# Patient Record
Sex: Female | Born: 1975
Health system: Southern US, Community
[De-identification: ages and names within clinical notes are randomized; demographics above are authoritative.]

## PROBLEM LIST (undated history)

## (undated) ENCOUNTER — Emergency Department (HOSPITAL_BASED_OUTPATIENT_CLINIC_OR_DEPARTMENT_OTHER): Admission: EM | Payer: 59 | Source: Home / Self Care

## (undated) DIAGNOSIS — R51 Headache: Secondary | ICD-10-CM

## (undated) DIAGNOSIS — R519 Headache, unspecified: Secondary | ICD-10-CM

## (undated) DIAGNOSIS — G589 Mononeuropathy, unspecified: Secondary | ICD-10-CM

## (undated) DIAGNOSIS — E049 Nontoxic goiter, unspecified: Secondary | ICD-10-CM

## (undated) DIAGNOSIS — S149XXA Injury of unspecified nerves of neck, initial encounter: Secondary | ICD-10-CM

## (undated) DIAGNOSIS — J45909 Unspecified asthma, uncomplicated: Secondary | ICD-10-CM

## (undated) DIAGNOSIS — Z789 Other specified health status: Secondary | ICD-10-CM

## (undated) DIAGNOSIS — O24419 Gestational diabetes mellitus in pregnancy, unspecified control: Secondary | ICD-10-CM

## (undated) DIAGNOSIS — Z8719 Personal history of other diseases of the digestive system: Secondary | ICD-10-CM

## (undated) DIAGNOSIS — Z9289 Personal history of other medical treatment: Secondary | ICD-10-CM

## (undated) DIAGNOSIS — D649 Anemia, unspecified: Secondary | ICD-10-CM

## (undated) HISTORY — PX: COLONOSCOPY: SHX174

## (undated) HISTORY — PX: NO PAST SURGERIES: SHX2092

## (undated) HISTORY — PX: UPPER GI ENDOSCOPY: SHX6162

---

## 1997-12-03 ENCOUNTER — Emergency Department (HOSPITAL_COMMUNITY): Admission: EM | Admit: 1997-12-03 | Discharge: 1997-12-04 | Payer: Self-pay | Admitting: Emergency Medicine

## 1998-08-16 ENCOUNTER — Emergency Department (HOSPITAL_COMMUNITY): Admission: EM | Admit: 1998-08-16 | Discharge: 1998-08-17 | Payer: Self-pay | Admitting: Emergency Medicine

## 1999-01-12 ENCOUNTER — Emergency Department (HOSPITAL_COMMUNITY): Admission: EM | Admit: 1999-01-12 | Discharge: 1999-01-12 | Payer: Self-pay | Admitting: *Deleted

## 2001-08-16 ENCOUNTER — Emergency Department (HOSPITAL_COMMUNITY): Admission: EM | Admit: 2001-08-16 | Discharge: 2001-08-16 | Payer: Self-pay | Admitting: Emergency Medicine

## 2005-01-26 ENCOUNTER — Ambulatory Visit: Payer: Self-pay | Admitting: Family Medicine

## 2005-02-01 ENCOUNTER — Ambulatory Visit: Payer: Self-pay | Admitting: Family Medicine

## 2005-02-01 ENCOUNTER — Other Ambulatory Visit: Admission: RE | Admit: 2005-02-01 | Discharge: 2005-02-01 | Payer: Self-pay | Admitting: Family Medicine

## 2005-03-09 ENCOUNTER — Ambulatory Visit: Payer: Self-pay | Admitting: Family Medicine

## 2006-01-26 ENCOUNTER — Ambulatory Visit: Payer: Self-pay | Admitting: Family Medicine

## 2006-05-05 ENCOUNTER — Ambulatory Visit: Payer: Self-pay | Admitting: Family Medicine

## 2006-05-25 ENCOUNTER — Encounter: Admission: RE | Admit: 2006-05-25 | Discharge: 2006-06-24 | Payer: Self-pay | Admitting: Family Medicine

## 2006-08-30 ENCOUNTER — Ambulatory Visit: Payer: Self-pay | Admitting: Family Medicine

## 2007-07-03 ENCOUNTER — Telehealth (INDEPENDENT_AMBULATORY_CARE_PROVIDER_SITE_OTHER): Payer: Self-pay | Admitting: *Deleted

## 2007-07-04 ENCOUNTER — Telehealth: Payer: Self-pay | Admitting: Family Medicine

## 2007-07-07 ENCOUNTER — Telehealth: Payer: Self-pay | Admitting: Family Medicine

## 2007-10-04 ENCOUNTER — Ambulatory Visit: Payer: Self-pay | Admitting: Internal Medicine

## 2007-10-26 ENCOUNTER — Ambulatory Visit: Payer: Self-pay | Admitting: Internal Medicine

## 2007-10-26 ENCOUNTER — Encounter: Payer: Self-pay | Admitting: Family Medicine

## 2007-10-30 ENCOUNTER — Telehealth: Payer: Self-pay | Admitting: Internal Medicine

## 2008-11-18 ENCOUNTER — Encounter: Payer: Self-pay | Admitting: Family Medicine

## 2008-11-26 ENCOUNTER — Ambulatory Visit: Payer: Self-pay | Admitting: Family Medicine

## 2008-11-26 DIAGNOSIS — K219 Gastro-esophageal reflux disease without esophagitis: Secondary | ICD-10-CM | POA: Insufficient documentation

## 2008-11-26 DIAGNOSIS — B373 Candidiasis of vulva and vagina: Secondary | ICD-10-CM

## 2008-11-26 DIAGNOSIS — D649 Anemia, unspecified: Secondary | ICD-10-CM | POA: Insufficient documentation

## 2008-11-26 DIAGNOSIS — K5909 Other constipation: Secondary | ICD-10-CM

## 2008-11-26 DIAGNOSIS — R51 Headache: Secondary | ICD-10-CM

## 2008-11-26 DIAGNOSIS — R519 Headache, unspecified: Secondary | ICD-10-CM | POA: Insufficient documentation

## 2008-11-26 DIAGNOSIS — F329 Major depressive disorder, single episode, unspecified: Secondary | ICD-10-CM

## 2008-11-29 LAB — CONVERTED CEMR LAB
ALT: 11 units/L (ref 0–35)
AST: 20 units/L (ref 0–37)
BUN: 10 mg/dL (ref 6–23)
Basophils Absolute: 0 10*3/uL (ref 0.0–0.1)
Bilirubin, Direct: 0 mg/dL (ref 0.0–0.3)
Calcium: 9 mg/dL (ref 8.4–10.5)
Creatinine, Ser: 0.9 mg/dL (ref 0.4–1.2)
Eosinophils Relative: 1.4 % (ref 0.0–5.0)
GFR calc non Af Amer: 93.1 mL/min (ref >60)
Lymphocytes Relative: 38.8 % (ref 12.0–46.0)
Monocytes Relative: 8.1 % (ref 3.0–12.0)
Neutrophils Relative %: 51.6 % (ref 43.0–77.0)
Platelets: 270 10*3/uL (ref 150.0–400.0)
Potassium: 3.6 meq/L (ref 3.5–5.1)
RDW: 13.2 % (ref 11.5–14.6)
Total Bilirubin: 0.5 mg/dL (ref 0.3–1.2)
WBC: 5.4 10*3/uL (ref 4.5–10.5)

## 2008-12-03 ENCOUNTER — Telehealth: Payer: Self-pay | Admitting: Family Medicine

## 2008-12-03 ENCOUNTER — Ambulatory Visit: Payer: Self-pay | Admitting: Family Medicine

## 2008-12-10 ENCOUNTER — Ambulatory Visit: Payer: Self-pay | Admitting: Family Medicine

## 2009-02-19 ENCOUNTER — Encounter: Payer: Self-pay | Admitting: Family Medicine

## 2009-03-14 ENCOUNTER — Telehealth: Payer: Self-pay | Admitting: Family Medicine

## 2009-03-27 ENCOUNTER — Ambulatory Visit: Payer: Self-pay | Admitting: Family Medicine

## 2009-03-28 ENCOUNTER — Telehealth: Payer: Self-pay | Admitting: *Deleted

## 2009-03-28 LAB — CONVERTED CEMR LAB
Eosinophils Relative: 0.9 % (ref 0.0–5.0)
HCT: 35.1 % — ABNORMAL LOW (ref 36.0–46.0)
HDL: 75.6 mg/dL (ref >39.00)
Hemoglobin: 11.8 g/dL — ABNORMAL LOW (ref 12.0–15.0)
LDL Cholesterol: 92 mg/dL (ref 0–99)
Lymphs Abs: 2 10*3/uL (ref 0.7–4.0)
MCV: 85.1 fL (ref 78.0–100.0)
Monocytes Absolute: 0.5 10*3/uL (ref 0.1–1.0)
Monocytes Relative: 7.9 % (ref 3.0–12.0)
Neutro Abs: 3.2 10*3/uL (ref 1.4–7.7)
Platelets: 315 10*3/uL (ref 150.0–400.0)
RDW: 13.6 % (ref 11.5–14.6)
Total CHOL/HDL Ratio: 2
Triglycerides: 87 mg/dL (ref 0.0–149.0)
Vitamin B-12: 371 pg/mL (ref 211–911)

## 2010-03-03 ENCOUNTER — Ambulatory Visit: Payer: Self-pay | Admitting: Family Medicine

## 2010-03-03 DIAGNOSIS — L708 Other acne: Secondary | ICD-10-CM | POA: Insufficient documentation

## 2010-03-03 DIAGNOSIS — B354 Tinea corporis: Secondary | ICD-10-CM | POA: Insufficient documentation

## 2010-03-04 LAB — CONVERTED CEMR LAB
ALT: 15 units/L (ref 0–35)
AST: 21 units/L (ref 0–37)
Alkaline Phosphatase: 71 units/L (ref 39–117)
BUN: 15 mg/dL (ref 6–23)
Basophils Relative: 0.4 % (ref 0.0–3.0)
Bilirubin, Direct: 0.1 mg/dL (ref 0.0–0.3)
Calcium: 9 mg/dL (ref 8.4–10.5)
Chloride: 105 meq/L (ref 96–112)
Creatinine, Ser: 0.9 mg/dL (ref 0.4–1.2)
Eosinophils Relative: 1.1 % (ref 0.0–5.0)
Ferritin: 16.8 ng/mL (ref 10.0–291.0)
GFR calc non Af Amer: 92.38 mL/min (ref >60)
Lymphocytes Relative: 30.3 % (ref 12.0–46.0)
Monocytes Absolute: 0.3 10*3/uL (ref 0.1–1.0)
Monocytes Relative: 5.7 % (ref 3.0–12.0)
Neutrophils Relative %: 62.5 % (ref 43.0–77.0)
Platelets: 294 10*3/uL (ref 150.0–400.0)
RBC: 3.94 M/uL (ref 3.87–5.11)
Total Bilirubin: 0.5 mg/dL (ref 0.3–1.2)
Total Protein: 6.8 g/dL (ref 6.0–8.3)
Vitamin B-12: 567 pg/mL (ref 211–911)
WBC: 5.4 10*3/uL (ref 4.5–10.5)

## 2010-07-05 NOTE — L&D Delivery Note (Signed)
Delivery Note At 3:21 AM a viable and healthy female was delivered via Vaginal, Spontaneous Delivery (Presentation: direct Occiput Posterior).  APGAR: 8, 9; weight .  5 lb 8 oz Placenta status: , .spont, intact sent to path 2nd to IUGR  Cord: 3 vessels with the following complications: Long.  Cord pH: none  cord blood donation collected Anesthesia: None  Episiotomy: None Lacerations: 2nd degree Suture Repair: 3.0 chromic Est. Blood Loss (mL): 250  Mom to postpartum.  Baby to nursery-stable.  Petar Mucci A 05/12/2011, 4:03 AM

## 2010-08-04 NOTE — Assessment & Plan Note (Signed)
Summary: skin issues/fu on b12/njr   Vital Signs:  Patient profile:   35 year old female Weight:      178 pounds BP sitting:   84 / 64  (left arm) Cuff size:   regular  Vitals Entered By: Raechel Ache, RN (March 03, 2010 9:54 AM) CC: F/u meds; talk about skin issues.   History of Present Illness: Here for several issues. First she has had increasing acne problems on the face and chest for the past 6 months. She sees  dermatologist [facility], but will not be able to see her for several months. They had discussed her using Epiduo, and she asks if I can get her started on this. Also, she gets yeast infections under the breasts which itch. lastly she needs some blood tests for her anemia. She has bben on oral iron and monthly B12 shots for the past year.   Allergies: No Known Drug Allergies  Past History:  Past Medical History: tension Headaches Positive PPD 2002, treated gonorrhea, treated 2006 Depression Anemia from both iron deficiency and low B12 sees Dr. Elmon Else for skin problems GERD chronic constipation sees Dr. Isaac Laud at [facility]  Review of Systems  The patient denies anorexia, fever, weight loss, weight gain, vision loss, decreased hearing, hoarseness, chest pain, syncope, dyspnea on exertion, peripheral edema, prolonged cough, headaches, hemoptysis, abdominal pain, melena, hematochezia, severe indigestion/heartburn, hematuria, incontinence, genital sores, muscle weakness, suspicious skin lesions, transient blindness, difficulty walking, depression, unusual weight change, abnormal bleeding, enlarged lymph nodes, angioedema, breast masses, and testicular masses.    Physical Exam  General:  Well-developed,well-nourished,in no acute distress; alert,appropriate and cooperative throughout examination Neck:  No deformities, masses, or tenderness noted. Lungs:  Normal respiratory effort, chest expands symmetrically. Lungs are clear to  auscultation, no crackles or wheezes. Heart:  Normal rate and regular rhythm. S1 and S2 normal without gallop, murmur, click, rub or other extra sounds. Skin:  has papulopustular acne on the face and upper chest    Impression & Recommendations:  Problem # 1:  ANEMIA-NOS (ICD-285.9)  Her updated medication list for this problem includes:    Ferrous Sulfate 325 (65 Fe) Mg Tabs (Ferrous sulfate) .Marland Kitchen..Marland Kitchen Two times a day    Cyanocobalamin 1000 Mcg/ml Soln (Cyanocobalamin) ..... Inject 1cc x 10 weeks  Orders: Venipuncture (19147) TLB-BMP (Basic Metabolic Panel-BMET) (80048-METABOL) TLB-CBC Platelet - w/Differential (85025-CBCD) TLB-Hepatic/Liver Function Pnl (80076-HEPATIC) TLB-TSH (Thyroid Stimulating Hormone) (84443-TSH) TLB-B12, Serum-Total ONLY (82956-O13)  Problem # 2:  ACNE VULGARIS (ICD-706.1)  Her updated medication list for this problem includes:    Epiduo 0.1-2.5 % Gel (Adapalene-benzoyl peroxide) .Marland Kitchen... Apply once daily  Problem # 3:  TINEA CORPORIS (ICD-110.5)  Complete Medication List: 1)  Ferrous Sulfate 325 (65 Fe) Mg Tabs (Ferrous sulfate) .... Two times a day 2)  Fluconazole 150 Mg Tabs (Fluconazole) .... One as needed 3)  Senokot 8.6 Mg Tabs (Sennosides) .... Once daily as needed 4)  Cyanocobalamin 1000 Mcg/ml Soln (Cyanocobalamin) .... Inject 1cc x 10 weeks 5)  Epiduo 0.1-2.5 % Gel (Adapalene-benzoyl peroxide) .... Apply once daily 6)  Ketoconazole 2 % Crea (Ketoconazole) .... Apply  three times a day as needed  Patient Instructions: 1)  check labs today Prescriptions: KETOCONAZOLE 2 % CREA (KETOCONAZOLE) apply  three times a day as needed  #60 x 5   Entered and Authorized by:   Nelwyn Salisbury MD   Signed by:   Nelwyn Salisbury MD on 03/03/2010   Method used:  Electronically to        [facility] (retail)       [address]       [place], [place]  [address]       Ph: [phone] or [phone]       Fax: [phone]   RxID:    [id number] FLUCONAZOLE 150 MG TABS (FLUCONAZOLE) one as needed  #1 x 11   Entered and Authorized by:   Nelwyn Salisbury MD   Signed by:   Nelwyn Salisbury MD on 03/03/2010   Method used:   Electronically to        [facility] (retail)       [address]       [place], [place]  [address]       Ph: [phone] or [phone]       Fax: [phone]   RxID:   [id number] EPIDUO 0.1-2.5 % GEL (ADAPALENE-BENZOYL PEROXIDE) apply once daily  #30 x 5   Entered and Authorized by:   Nelwyn Salisbury MD   Signed by:   Nelwyn Salisbury MD on 03/03/2010   Method used:   Electronically to        [facility] (retail)       [address]       [place], [place]  [address]       Ph: [phone] or [phone]       Fax: [phone]   RxID:   [id number]   Appended Document: Orders Update     Clinical Lists Changes  Orders: Added new Service order of Specimen Handling (81829) - Signed      Appended Document: Orders Update     Clinical Lists Changes  Orders: Added new Test order of TLB-Ferritin (82728-FER) - Signed

## 2010-09-23 LAB — ABO/RH: RH Type: POSITIVE

## 2010-10-12 LAB — RUBELLA ANTIBODY, IGM: Rubella: IMMUNE

## 2010-10-12 LAB — ANTIBODY SCREEN: Antibody Screen: NEGATIVE

## 2010-10-26 LAB — GC/CHLAMYDIA PROBE AMP, GENITAL: Chlamydia: NEGATIVE

## 2010-11-17 NOTE — Assessment & Plan Note (Signed)
[facility]                                 ON-CALL NOTE   Nicole Donaldson, PALLESCHI                          MRN:          [id number]  DATE:10/26/2007                            DOB:          January 15, 1976    She had an EGD and colonoscopy performed today, Dr. Marina Goodell performed  these procedures.  The best I can tell, they were uneventful.  However,  she felt like her teeth are very cold sensitive and there is some  numbness in her mouth right now.  There is no breathing difficulty or  chest pain or other worrisome symptoms that I can tell.  I told her to  avoid very cold liquids and to give it some time and I suspect that this  would pass and it may not even be related to her procedures.     Iva Boop, MD,FACG  Electronically Signed    CEG/MedQ  DD: 10/26/2007  DT: 10/26/2007  Job #: [id number]   cc:   Wilhemina Bonito. Marina Goodell, MD

## 2010-11-17 NOTE — Assessment & Plan Note (Signed)
[facility]                         GASTROENTEROLOGY OFFICE NOTE   Nicole Donaldson, Nicole Donaldson                        MRN:          [id number]  DATE:10/04/2007                            DOB:          Jul 03, 1976    REASON OR EVALUATION:  Abdominal issues.   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  The  patient has no significant past medical history except for chronic  headaches.  She tells me that in 2001 she developed problems with  epigastric fullness and discomfort.  There was no change with food.  She  also has chronic constipation.  In 2001 she had a barium enema which was  negative.  She subsequently developed problems with nausea, dizziness,  and lightheadedness.  In 2008 she had an ultrasound of the abdomen as  well as HIDA scan, which were negative.  She is continued intermittently  with nausea, epigastric fullness and constipation.  More recently she  has noticed some blood on the tissue with defecation, as well as an  uncomfortable fullness and sensation in the rectum.  Earlier this year  she underwent upper GI with small bowel follow through which is  negative.  She denies significant lower abdominal pain, diarrhea,  melena, or weight loss.  She had been placed on Protonix, which she has  been taking sporadically.  She thought this has helped some fullness in  the throat sensation, but maybe not the other symptoms.  She is taking  nothing for her constipation on a regular basis.   PAST MEDICAL HISTORY:  Chronic headaches.   PAST SURGICAL HISTORY:  None.   ALLERGIES:  AMOXICILLIN.   CURRENT MEDICATIONS:  1. Protonix 40 mg p.r.n.  2. Birth control pills.  3. She also uses BC Powders p.r.n. and ibuprofen p.r.n.   FAMILY HISTORY:  No family history of gastrointestinal malignancy.   SOCIAL HISTORY:  The patient is single, without children.  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  Does not smoke.  Rarely uses alcohol.   REVIEW OF SYSTEMS:  Per diagnostic evaluation form.  Of importance, the  patient has regular menstrual periods and just completed her last cycle  yesterday.   PHYSICAL EXAMINATION:  Well-appearing female in no acute distress.  Blood pressure 112/62.  Heart rate 72.  Weight is 168.6 pounds.  She is  5 feet 6 inches in height.  HEENT:  Sclerae anicteric.  Conjunctivae are pink.  Oral mucosa is  intact.  There is no adenopathy.  LUNGS:  Clear.  HEART:  Regular.  ABDOMEN:  Soft without tenderness, mass or hernia.  Good bowel sounds  heard.  EXTREMITIES:  Without edema.   LABORATORIES:  Laboratories obtained August 10, 2007 were unremarkable  including normal amylase, comprehensive metabolic panel, lipase, and  thyroid-stimulating hormone.  CBC was also unremarkable except for  mildly decreased hemoglobin at 11.3.  MCV was normal.   IMPRESSION:  This is a 35 year old female who presents with complaints  of persistent  nausea, epigastric fullness/discomfort, constipation, and  new onset minor rectal bleeding.  The patient's upper gastrointestinal  complaints could be due to peptic ulcer or reflux disease.  She has not  been using Protonix regularly.  Alternatively, they may be secondary to  constipation.  In terms of her rectal bleeding and fullness, it may be  due to hemorrhoid but should be evaluated further.   RECOMMENDATIONS:  1. Colonoscopy and upper endoscopy to evaluate the aforementioned      symptoms.  The nature of the procedure as well as the risks,      benefits, and alternatives have been reviewed.  She understood and      agreed to proceed.  2. MiraLAX daily for constipation.  3. Use proton pump inhibitor on a regular basis.  4. Ongoing general medical care with Dr. Clent Ridges.     Wilhemina Bonito. Marina Goodell, MD  Electronically Signed    JNP/MedQ  DD:  10/04/2007  DT: 10/04/2007  Job #: [id number]   cc:   Prudy Mater. Clent Ridges, MD  Dorie Rank, P.A.

## 2010-11-20 NOTE — Assessment & Plan Note (Signed)
[facility] OFFICE NOTE   NANSI, BIRMINGHAM                        MRN:          [id number]  DATE:08/30/2006                            DOB:          August 24, 1975    This is a 35 year old woman here for a non-gynecological physical  examination. She does have several complaints to discuss. First off, two  months ago, she began having epigastric pains, right flank pains and  nausea without vomiting on an intermittent basis. There seems to be no  relationship to eating or not eating. It does not wake her up at night.  There has been no vomiting, no fever and no heartburn. She was seen at  [facility] twice over the last couple of weeks. At one point, she had  complete lab work done, which was all within normal limits other than a  low potassium at 3.3, specifically liver enzymes were normal. White  blood cell count was normal and Helico pylori test was negative. She  also had an abdominal ultrasound that was reportedly normal, although  she does not bring copies of this report today. She is given a  prescription for Protonix, but has not started it yet. She is trying to  follow a bland diet. 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 Her bowels have been working well otherwise. As far  as her anxiety and depression goes, she has been doing well. She has  stopped all of her medications and is very pleased about things. She  goes to [facility] on a regular basis for gynecology  examinations. For other details of her past medical history, family  history, social history, habits, etc..refer to our last physical note  dated February 01, 2005.   ALLERGIES:  None.   CURRENT MEDICATIONS:  1. Yasmin daily.  2. Metro gel to the face twice daily as needed.   OBJECTIVE:  Height 5 feet, 4 inches. Weight 168, blood pressure  102/72,  pulse is 80 and regular. Temperature 98.4 degrees.  In general, she is in no acute distress.  SKIN: Is clear.  EYES: Are clear.  EARS: Are clear.  PHARYNX: Is clear.  NECK: Supple, without lymphadenopathy or masses.  LUNGS:  Clear.  CARDIAC: Rate and rhythm are regular without gallops, murmur or rubs.  Distal pulses are full.  ABDOMEN: Soft. Normal bowel sounds, nontender and no masses.  EXTREMITIES: No clubbing, cyanosis or edema.  NEUROLOGIC: Grossly intact.   ASSESSMENT/PLAN:  1. Complete physical. Encouraged her to get more regular exercise.  2. Epigastric pain. She does have samples at home of Nexium and      advised her to get on this once a day on a regular basis. Will also      set up a hepatobiliary scan to further evaluate her      gallbladder function.  3. Hypokalemia. Will begin K-Dur 10 mEq once a day.  4. History of anxiety, now stable off of medications.  Mistina Mater. Clent Ridges, MD  Electronically Signed    SAF/MedQ  DD: 08/30/2006  DT: 08/30/2006  Job #: [id number]

## 2010-11-23 ENCOUNTER — Telehealth: Payer: Self-pay | Admitting: Family Medicine

## 2010-11-23 NOTE — Telephone Encounter (Signed)
[facility] called and said that pts Vit B-12 in borderline low and would like to get recommendation from Dr Clent Ridges on how to treat. Pt is pregnant and is due 05/17/11.

## 2010-11-23 NOTE — Telephone Encounter (Signed)
Per Dr. Clent Ridges he recommends the pt takes a B12 supplement daily to keep her B12 at the current level.

## 2010-11-23 NOTE — Telephone Encounter (Signed)
Called and spoke with Nicole Donaldson at [facility] and she stated Dr. Wonda Olds said the pt is on the lower end of b12 deficiency and the pt is experiencing numbness in her heels.

## 2010-11-24 NOTE — Telephone Encounter (Signed)
Spoke with Nicole Donaldson at  [facility] and she is aware that Dr. Clent Ridges would like pt to take a daily b12 supplement.

## 2011-02-24 ENCOUNTER — Encounter: Payer: 59 | Attending: Obstetrics and Gynecology | Admitting: Dietician

## 2011-02-24 ENCOUNTER — Encounter: Payer: Self-pay | Admitting: Dietician

## 2011-02-24 DIAGNOSIS — Z713 Dietary counseling and surveillance: Secondary | ICD-10-CM | POA: Insufficient documentation

## 2011-02-24 DIAGNOSIS — O99810 Abnormal glucose complicating pregnancy: Secondary | ICD-10-CM | POA: Insufficient documentation

## 2011-02-24 NOTE — Progress Notes (Signed)
  Patient was seen on 02/24/2011 for Gestational Diabetes self-management class at [facility]. The following learning objectives were met by the patient during this course:   States the definition of Gestational Diabetes  States why dietary management is important in controlling blood glucose  Describes the effects each nutrient has on blood glucose levels  Demonstrates ability to create a balanced meal plan  Demonstrates carbohydrate counting   States when to check blood glucose levels  Demonstrates proper blood glucose monitoring techniques  States the effect of stress and exercise on blood glucose levels  States the importance of limiting caffeine and abstaining from alcohol and smoking  Blood glucose monitor given: Accu Chek Nano SmatrView Blood Glucose Monitoring System  Lot # [id number]  Exp: 06/03/2012 Blood glucose reading: 98 pre-lunch  Patient instructed to monitor glucose levels:Fasting and 2 hours post-meal FBS: 60 - <90 1 hour: <140 2 hour: <120  Patient will be seen for follow-up as needed.

## 2011-04-16 LAB — STREP B DNA PROBE: GBS: POSITIVE

## 2011-05-11 ENCOUNTER — Encounter (HOSPITAL_COMMUNITY): Payer: Self-pay | Admitting: *Deleted

## 2011-05-11 ENCOUNTER — Inpatient Hospital Stay (HOSPITAL_COMMUNITY)
Admission: AD | Admit: 2011-05-11 | Discharge: 2011-05-14 | DRG: 775 | Disposition: A | Discharge status: Home / Self Care | Payer: 59 | Source: Ambulatory Visit | Attending: Obstetrics and Gynecology | Admitting: Obstetrics and Gynecology

## 2011-05-11 DIAGNOSIS — O99814 Abnormal glucose complicating childbirth: Secondary | ICD-10-CM | POA: Diagnosis present

## 2011-05-11 DIAGNOSIS — F329 Major depressive disorder, single episode, unspecified: Secondary | ICD-10-CM

## 2011-05-11 DIAGNOSIS — K219 Gastro-esophageal reflux disease without esophagitis: Secondary | ICD-10-CM

## 2011-05-11 DIAGNOSIS — IMO0001 Reserved for inherently not codable concepts without codable children: Secondary | ICD-10-CM | POA: Diagnosis present

## 2011-05-11 DIAGNOSIS — L708 Other acne: Secondary | ICD-10-CM

## 2011-05-11 DIAGNOSIS — K5909 Other constipation: Secondary | ICD-10-CM

## 2011-05-11 DIAGNOSIS — D649 Anemia, unspecified: Secondary | ICD-10-CM

## 2011-05-11 DIAGNOSIS — B354 Tinea corporis: Secondary | ICD-10-CM

## 2011-05-11 DIAGNOSIS — O365990 Maternal care for other known or suspected poor fetal growth, unspecified trimester, not applicable or unspecified: Principal | ICD-10-CM | POA: Diagnosis present

## 2011-05-11 DIAGNOSIS — D62 Acute posthemorrhagic anemia: Secondary | ICD-10-CM | POA: Diagnosis not present

## 2011-05-11 DIAGNOSIS — R51 Headache: Secondary | ICD-10-CM

## 2011-05-11 DIAGNOSIS — B373 Candidiasis of vulva and vagina: Secondary | ICD-10-CM

## 2011-05-11 DIAGNOSIS — O9903 Anemia complicating the puerperium: Secondary | ICD-10-CM | POA: Diagnosis not present

## 2011-05-11 HISTORY — DX: Other specified health status: Z78.9

## 2011-05-11 LAB — CBC
HCT: 31.3 % — ABNORMAL LOW (ref 36.0–46.0)
Hemoglobin: 10.3 g/dL — ABNORMAL LOW (ref 12.0–15.0)
MCH: 26.4 pg (ref 26.0–34.0)
MCHC: 32.9 g/dL (ref 30.0–36.0)
MCV: 80.3 fL (ref 78.0–100.0)

## 2011-05-11 MED ORDER — OXYCODONE-ACETAMINOPHEN 5-325 MG PO TABS: 2.0000 | ORAL_TABLET | ORAL | Status: DC | PRN

## 2011-05-11 MED ORDER — LIDOCAINE HCL (PF) 1 % IJ SOLN
30.0000 mL | INTRAMUSCULAR | Status: DC | PRN
  Administered 2011-05-12: 30 mL via SUBCUTANEOUS
  Filled 2011-05-11: qty 30

## 2011-05-11 MED ORDER — OXYTOCIN 20 UNITS IN LACTATED RINGERS INFUSION - SIMPLE: 125.0000 mL/h | Freq: Once | INTRAVENOUS | Status: DC

## 2011-05-11 MED ORDER — ACETAMINOPHEN 325 MG PO TABS: 650.0000 mg | ORAL_TABLET | ORAL | Status: DC | PRN

## 2011-05-11 MED ORDER — MISOPROSTOL 25 MCG QUARTER TABLET
25.0000 ug | ORAL_TABLET | ORAL | Status: DC | PRN
  Administered 2011-05-11 – 2011-05-12 (×2): 25 ug via VAGINAL
  Filled 2011-05-11 (×2): qty 0.25

## 2011-05-11 MED ORDER — OXYTOCIN BOLUS FROM INFUSION
500.0000 mL | Freq: Once | INTRAVENOUS | Status: DC
  Filled 2011-05-11: qty 500

## 2011-05-11 MED ORDER — ONDANSETRON HCL 4 MG/2ML IJ SOLN: 4.0000 mg | Freq: Four times a day (QID) | INTRAMUSCULAR | Status: DC | PRN

## 2011-05-11 MED ORDER — OXYTOCIN 20 UNITS IN LACTATED RINGERS INFUSION - SIMPLE
1.0000 m[IU]/min | INTRAVENOUS | Status: DC
  Filled 2011-05-11: qty 1000

## 2011-05-11 MED ORDER — OXYTOCIN 10 UNIT/ML IJ SOLN: 10.0000 [IU] | Freq: Once | INTRAMUSCULAR | Status: DC

## 2011-05-11 MED ORDER — CLINDAMYCIN PHOSPHATE 900 MG/50ML IV SOLN
900.0000 mg | Freq: Three times a day (TID) | INTRAVENOUS | Status: DC
  Administered 2011-05-11: 900 mg via INTRAVENOUS
  Filled 2011-05-11 (×3): qty 50

## 2011-05-11 MED ORDER — LACTATED RINGERS IV SOLN
INTRAVENOUS | Status: DC
  Administered 2011-05-12: 1000 mL via INTRAVENOUS
  Administered 2011-05-12: 200 mL via INTRAVENOUS

## 2011-05-11 MED ORDER — LACTATED RINGERS IV SOLN: 500.0000 mL | INTRAVENOUS | Status: DC | PRN

## 2011-05-11 MED ORDER — CITRIC ACID-SODIUM CITRATE 334-500 MG/5ML PO SOLN: 30.0000 mL | ORAL | Status: DC | PRN

## 2011-05-11 NOTE — H&P (Addendum)
Nicole Donaldson is a 35 y.o. female presenting for induction of labor 2nd to IUGR. Sono revealed EFW  5lb 10 (7%) low nl AFI. PNC complicated by Class A1 GDM diet controlled  History OB History    Grav Para Term Preterm Abortions TAB SAB Ect Mult Living   1              Past Medical History  Diagnosis Date  . No pertinent past medical history    Past Surgical History  Procedure Date  . No past surgeries    Family History: family history is not on file. Social History:  reports that she has never smoked. She does not have any smokeless tobacco history on file. She reports that she does not drink alcohol or use illicit drugs.  Review of Systems  All other systems reviewed and are negative.    Dilation: Closed Effacement (%): 70 Station: -3 Exam by:: LCarpenter,RN Blood pressure 134/74, pulse 103, temperature 98.3 F (36.8 C), temperature source Oral, resp. rate 18, height 5\' 6"  (1.676 m), weight 85.276 kg (188 lb), last menstrual period 08/11/2010. Exam Physical Exam  Constitutional: She is oriented to person, place, and time. She appears well-developed and well-nourished.  HENT:  Head: Normocephalic.  Eyes: EOM are normal.  Neck: Neck supple.  Cardiovascular: Regular rhythm.   Respiratory: Breath sounds normal.  GI: Soft.  Musculoskeletal: She exhibits no edema.  Neurological: She is alert and oriented to person, place, and time.  Skin: Skin is warm and dry.  Psychiatric: She has a normal mood and affect.    Prenatal labs: ABO, Rh: O/Positive/-- (03/21 0000) Antibody: Negative (04/09 0000) Rubella: Immune (04/09 0000) RPR: Nonreactive (08/13 0000)  HBsAg: Negative (04/09 0000)  HIV: Non-reactive (04/09 0000)  GBS: Positive (10/12 0000)   Assessment/Plan: IUGR Term gestation GBS cx (+)  Class A1 GDM  P) Admit   Cytotec. Routine labs IV clindamycin. Analgesic prn BS q 4 hrs  Nicole Donaldson A 05/11/2011, 9:57 PM

## 2011-05-12 ENCOUNTER — Encounter (HOSPITAL_COMMUNITY): Payer: Self-pay | Admitting: *Deleted

## 2011-05-12 ENCOUNTER — Other Ambulatory Visit: Payer: Self-pay | Admitting: Obstetrics and Gynecology

## 2011-05-12 LAB — GLUCOSE, CAPILLARY: Glucose-Capillary: 94 mg/dL (ref 70–99)

## 2011-05-12 MED ORDER — ONDANSETRON HCL 4 MG/2ML IJ SOLN: 4.0000 mg | INTRAMUSCULAR | Status: DC | PRN

## 2011-05-12 MED ORDER — ZOLPIDEM TARTRATE 5 MG PO TABS: 5.0000 mg | ORAL_TABLET | Freq: Every evening | ORAL | Status: DC | PRN

## 2011-05-12 MED ORDER — SIMETHICONE 80 MG PO CHEW: 80.0000 mg | CHEWABLE_TABLET | ORAL | Status: DC | PRN

## 2011-05-12 MED ORDER — OXYCODONE-ACETAMINOPHEN 5-325 MG PO TABS: 1.0000 | ORAL_TABLET | ORAL | Status: DC | PRN

## 2011-05-12 MED ORDER — BENZOCAINE-MENTHOL 20-0.5 % EX AERO
1.0000 "application " | INHALATION_SPRAY | CUTANEOUS | Status: DC | PRN
  Administered 2011-05-12: 1 via TOPICAL

## 2011-05-12 MED ORDER — DIBUCAINE 1 % RE OINT: 1.0000 "application " | TOPICAL_OINTMENT | RECTAL | Status: DC | PRN

## 2011-05-12 MED ORDER — POLYSACCHARIDE IRON 150 MG PO CAPS
150.0000 mg | ORAL_CAPSULE | Freq: Two times a day (BID) | ORAL | Status: DC
  Administered 2011-05-12 – 2011-05-13 (×2): 150 mg via ORAL
  Filled 2011-05-12 (×3): qty 1

## 2011-05-12 MED ORDER — IBUPROFEN 600 MG PO TABS
600.0000 mg | ORAL_TABLET | Freq: Four times a day (QID) | ORAL | Status: DC | PRN
  Administered 2011-05-12 – 2011-05-14 (×7): 600 mg via ORAL
  Filled 2011-05-12 (×8): qty 1

## 2011-05-12 MED ORDER — NALBUPHINE SYRINGE 5 MG/0.5 ML
5.0000 mg | INJECTION | INTRAMUSCULAR | Status: DC | PRN
  Administered 2011-05-12: 5 mg via INTRAVENOUS
  Filled 2011-05-12: qty 0.5

## 2011-05-12 MED ORDER — BENZOCAINE-MENTHOL 20-0.5 % EX AERO
INHALATION_SPRAY | CUTANEOUS | Status: AC
  Administered 2011-05-12: 1 via TOPICAL
  Filled 2011-05-12: qty 56

## 2011-05-12 MED ORDER — WITCH HAZEL-GLYCERIN EX PADS: 1.0000 "application " | MEDICATED_PAD | CUTANEOUS | Status: DC | PRN

## 2011-05-12 MED ORDER — ONDANSETRON HCL 4 MG PO TABS: 4.0000 mg | ORAL_TABLET | ORAL | Status: DC | PRN

## 2011-05-12 MED ORDER — PRENATAL PLUS 27-1 MG PO TABS
1.0000 | ORAL_TABLET | Freq: Every day | ORAL | Status: DC
  Administered 2011-05-12 – 2011-05-13 (×2): 1 via ORAL
  Filled 2011-05-12 (×2): qty 1

## 2011-05-12 MED ORDER — TETANUS-DIPHTH-ACELL PERTUSSIS 5-2.5-18.5 LF-MCG/0.5 IM SUSP
0.5000 mL | Freq: Once | INTRAMUSCULAR | Status: DC
  Filled 2011-05-12: qty 0.5

## 2011-05-12 MED ORDER — LANOLIN HYDROUS EX OINT: TOPICAL_OINTMENT | CUTANEOUS | Status: DC | PRN

## 2011-05-12 NOTE — Progress Notes (Signed)
  PPD 0 SVD  S:  Reports feeling well - tired / + cramping             Tolerating po/ No nausea or vomiting             Bleeding is moderate             Pain controlled withprescription NSAID's including motrin             Up ad lib / ambulatory  Newborn breast feeding  / female   O:  A & O x 3 NAD             VS: Blood pressure 116/75, pulse 89, temperature 97.3 F (36.3 C), temperature source Oral, resp. rate 20, height 5\' 6"  (1.676 m), weight 85.276 kg (188 lb), last menstrual period 08/11/2010, SpO2 98.00%, unknown if currently breastfeeding.    Abdomen: soft, non-tender, non-distended             Fundus: firm, non-tender, Ueven  Perineum: ice pack in place  Lochia: moderate  Extremities: no edema, no calf pain or tenderness    A: PPD # 0   Doing well - stable status  P:  Routine post partum orders    Nicole Donaldson 05/12/2011, 10:25 AM

## 2011-05-12 NOTE — Progress Notes (Addendum)
S:  Called @ 3 am by RN 2nd to pt went from ft to 8 cm after pain med. (+) bulging membrane. On arrival pt is complete w/ urge to push. Bag protruding through vagina  S/p cytotec x 2  O: Ve  Fully (+3) intact.   tracing: (+) variable decels with ctx Baseline 140 ? Ctx feq BS 94  IMP: complete  IUGR GBS cx (+) Class A1 GDM  P) anticipate SVD

## 2011-05-13 DIAGNOSIS — IMO0001 Reserved for inherently not codable concepts without codable children: Secondary | ICD-10-CM | POA: Diagnosis present

## 2011-05-13 LAB — CBC
HCT: 27.9 % — ABNORMAL LOW (ref 36.0–46.0)
Hemoglobin: 9.1 g/dL — ABNORMAL LOW (ref 12.0–15.0)
MCH: 26.9 pg (ref 26.0–34.0)
MCV: 82.5 fL (ref 78.0–100.0)
Platelets: 220 10*3/uL (ref 150–400)
RBC: 3.38 MIL/uL — ABNORMAL LOW (ref 3.87–5.11)

## 2011-05-13 MED ORDER — DOCUSATE SODIUM 100 MG PO CAPS
100.0000 mg | ORAL_CAPSULE | Freq: Every day | ORAL | Status: DC
  Administered 2011-05-13: 100 mg via ORAL
  Filled 2011-05-13: qty 1

## 2011-05-13 MED ORDER — POLYSACCHARIDE IRON 150 MG PO CAPS
150.0000 mg | ORAL_CAPSULE | Freq: Every day | ORAL | Status: DC
  Filled 2011-05-13 (×3): qty 1

## 2011-05-13 NOTE — Progress Notes (Addendum)
  PPD 1 SVD  S:  Reports feeling well             Tolerating po/ No nausea or vomiting             Bleeding is moderate             Pain controlled withprescription NSAID's including motrin             Up ad lib / ambulatory  Newborn  Information for the patient's newborn:  Rickia, [place] [[id number]]  female   breast feeding , good latch   O:  A & O x 3 NAD             VS: Blood pressure 101/70, pulse 98, temperature 97.9 F (36.6 C), temperature source Oral, resp. rate 17, height 5\' 6"  (1.676 m), weight 85.276 kg (188 lb), last menstrual period 08/11/2010, SpO2 96.00%, unknown if currently breastfeeding.  LABS:   Basename 05/13/11 0530 05/11/11 2100  HGB 9.1* 10.3*  HCT 27.9* 31.3*    I&O: I/O last 3 completed shifts: In: -  Out: 250 [Blood:250]   Total I/O In: 360 [P.O.:360] Out: -   Lungs: Clear and unlabored  Heart: regular rate and rhythm / no mumurs  Abdomen: soft, non-tender, non-distended              Fundus: firm, non-tender, @ U  Perineum: 2nd deg. Lac repair intact, no edema  Lochia: small  Extremities: no edema, no calf pain or tenderness, neg Homans    A/P: PPD # 1 35 y.o., G1P1001 S/P:induced vaginal   Active Problems:  Postpartum care following vaginal delivery (11/7)  Maternal iron deficiency anemia w/ ABL anemia post SVB start oral fe and stool softener  Doing well - stable status  Routine post partum orders  Anticipate discharge home in AM.   PAUL,DANIELA, CNM, MSN 05/13/2011, 10:33 AM

## 2011-05-14 MED ORDER — DSS 100 MG PO CAPS: 100.0000 mg | ORAL_CAPSULE | Freq: Every day | ORAL | Status: AC

## 2011-05-14 MED ORDER — IBUPROFEN 600 MG PO TABS: 600.0000 mg | ORAL_TABLET | Freq: Four times a day (QID) | ORAL | Status: AC | PRN

## 2011-05-14 MED ORDER — POLYSACCHARIDE IRON 150 MG PO CAPS: 150.0000 mg | ORAL_CAPSULE | Freq: Every day | ORAL | Status: DC

## 2011-05-14 MED ORDER — OXYCODONE-ACETAMINOPHEN 5-325 MG PO TABS: 1.0000 | ORAL_TABLET | Freq: Four times a day (QID) | ORAL | Status: AC | PRN

## 2011-05-14 NOTE — Discharge Summary (Signed)
Obstetric Discharge Summary Reason for Admission: presenting for induction of labor 2nd to IUGR. Sono revealed EFW  5lb 10 (7%) low nl AFI. PNC complicated by Class A1 GDM diet controlled Prenatal Procedures: ultrasound Intrapartum Procedures: spontaneous vaginal delivery Postpartum Procedures: none Complications-Operative and Postpartum: 2nd degree perineal laceration Hemoglobin  Date Value Range Status  05/13/2011 9.1* 12.0-15.0 (g/dL) Final     HCT  Date Value Range Status  05/13/2011 27.9* 36.0-46.0 (%) Final    Discharge Diagnoses: Term Pregnancy-delivered  Discharge Information: Date: 05/14/2011 Activity: pelvic rest Diet: routine Medications: Ibuprofen, Colace, Iron and Percocet Condition: stable Instructions: refer to practice specific booklet Discharge to: home Follow-up Information    Follow up with COUSINS,SHERONETTE A, MD in 6 weeks.   Contact information:   [address] [place] [address] [phone]          Newborn Data: Live born female on 05/12/11 Birth Weight: 5 lb 8.9 oz (2520 g) APGAR: 8, 9  Home with mother.  Tome Wilson K 05/14/2011, 9:45 AM

## 2011-05-14 NOTE — Progress Notes (Signed)
PPD # 2  Subjective: Pt reports feeling feeling well and eager for d/c home/ Pain controlled with prescription NSAID's including motrin and occ percocet Tolerating po/ Voiding without problems/ No n/v Bleeding is light/ Newborn info:  Information for the patient's newborn:  [place], [place] [[id number]]  female  Feeding: breast    Objective:  VS: Blood pressure 98/63, pulse 92, temperature 98.3 F (36.8 C), temperature source Oral, resp. rate 18     Basename 05/13/11 0530 05/11/11 2100  WBC 8.0 7.2  HGB 9.1* 10.3*  HCT 27.9* 31.3*  PLT 220 239    Blood type: --/--/O POS (11/06 2100) Rubella: Immune (04/09 0000)    Physical Exam:  General: alert, cooperative and no distress Abdomen: soft, nontender, normal bowel sounds Uterine Fundus: firm, below umbilicus, nontender Perineum: healing with good reapproximation and small amt edema Lochia: minimal Ext: Homans sign is negative, no sign of DVT and no edema, redness or tenderness in the calves or thighs   A/P: PPD # 2/ G1P1001 S/P SVD w/2nd degree laceration ABL Anemia Doing well and stable for discharge home RX: Ibuprofen 600mg  po Q 6 hrs prn pain #30 Refill x 1 Ferralet 90 1 po QD #30 Refill x 1 Percocet 5/325 1 to 2 po Q 4 hrs prn pain #15 No refill WOB/GYN booklet given Routine pp visit in 6wks

## 2012-08-19 ENCOUNTER — Other Ambulatory Visit: Payer: Self-pay

## 2013-03-28 ENCOUNTER — Ambulatory Visit (HOSPITAL_BASED_OUTPATIENT_CLINIC_OR_DEPARTMENT_OTHER)
Admission: RE | Admit: 2013-03-28 | Discharge: 2013-03-28 | Disposition: A | Discharge status: Home / Self Care | Payer: 59 | Source: Ambulatory Visit | Attending: Internal Medicine | Admitting: Internal Medicine

## 2013-03-28 ENCOUNTER — Encounter: Payer: Self-pay | Admitting: Internal Medicine

## 2013-03-28 ENCOUNTER — Ambulatory Visit (INDEPENDENT_AMBULATORY_CARE_PROVIDER_SITE_OTHER): Payer: 59 | Admitting: Internal Medicine

## 2013-03-28 ENCOUNTER — Ambulatory Visit: Payer: 59 | Admitting: Internal Medicine

## 2013-03-28 VITALS — BP 123/85 | HR 100 | Temp 97.0°F | Resp 18 | Ht 66.0 in | Wt 174.0 lb

## 2013-03-28 DIAGNOSIS — R51 Headache: Secondary | ICD-10-CM

## 2013-03-28 DIAGNOSIS — E049 Nontoxic goiter, unspecified: Secondary | ICD-10-CM

## 2013-03-28 DIAGNOSIS — E538 Deficiency of other specified B group vitamins: Secondary | ICD-10-CM

## 2013-03-28 DIAGNOSIS — R21 Rash and other nonspecific skin eruption: Secondary | ICD-10-CM

## 2013-03-28 DIAGNOSIS — D649 Anemia, unspecified: Secondary | ICD-10-CM

## 2013-03-28 LAB — COMPREHENSIVE METABOLIC PANEL
Albumin: 4.1 g/dL (ref 3.5–5.2)
Alkaline Phosphatase: 56 U/L (ref 39–117)
BUN: 12 mg/dL (ref 6–23)
CO2: 28 mEq/L (ref 19–32)
Calcium: 9.4 mg/dL (ref 8.4–10.5)
Chloride: 103 mEq/L (ref 96–112)
Creat: 0.78 mg/dL (ref 0.50–1.10)
Glucose, Bld: 95 mg/dL (ref 70–99)
Total Bilirubin: 0.3 mg/dL (ref 0.3–1.2)

## 2013-03-28 LAB — CBC WITH DIFFERENTIAL/PLATELET
Basophils Relative: 1 % (ref 0–1)
Eosinophils Absolute: 0.1 10*3/uL (ref 0.0–0.7)
Eosinophils Relative: 2 % (ref 0–5)
HCT: 37.4 % (ref 36.0–46.0)
Hemoglobin: 12 g/dL (ref 12.0–15.0)
Lymphs Abs: 1.4 10*3/uL (ref 0.7–4.0)
MCH: 26.8 pg (ref 26.0–34.0)
MCHC: 32.1 g/dL (ref 30.0–36.0)
MCV: 83.7 fL (ref 78.0–100.0)
Monocytes Absolute: 0.3 10*3/uL (ref 0.1–1.0)
Monocytes Relative: 7 % (ref 3–12)
Neutrophils Relative %: 58 % (ref 43–77)
Platelets: 393 10*3/uL (ref 150–400)
RBC: 4.47 MIL/uL (ref 3.87–5.11)
WBC: 4.3 10*3/uL (ref 4.0–10.5)

## 2013-03-28 LAB — LIPID PANEL
Cholesterol: 169 mg/dL (ref 0–200)
HDL: 68 mg/dL (ref >39)
LDL Cholesterol: 92 mg/dL (ref 0–99)
Triglycerides: 47 mg/dL (ref <150)
VLDL: 9 mg/dL (ref 0–40)

## 2013-03-28 NOTE — Patient Instructions (Addendum)
See me next Thursday  Will set up appt with Dr. Emily Filbert or Dr. Sharyn Lull

## 2013-03-28 NOTE — Progress Notes (Signed)
Subjective:    Patient ID: Nicole Donaldson, female    DOB: 10-May-1976, 37 y.o.   MRN: [id number]  HPI Nicole Donaldson is here as a new pt.   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   She is here with her 38 yo daughter  She is here  To establish care.  Former care Dr. Clent Ridges at [place].    PMH of B12 deficiency, gestational diabetes,  Migraine headaces,  +PPD ( treated with 9 months of [facility]),  Hiatal hernia  She is concerned over several issues  Fatigue , sleep deprivation  :  She works second shift until 3 am.   She has a 2 yo daughter that she shares caretaking responsibilities with biologic father.  She know that she is chronically sleep deprived as she has to get up early to bring daughter home from caretaker.   Has a hard time "shutting her mind down".   Skin rash  Chronic dark rash both lower legs. She has seen mulltiple dermatologists including one at [facility]  Dr. Randye Lobo   Told it was due to shaving but she is not shavinglegs now.  Never had a biopsy.      B12 deficiency  This has not been checked in quite a while  She has had numbness in toes for last 6 months.  Usually involves toes and now numb in feet.    Allergies  Allergen Reactions  . Clarithromycin Swelling    Lip swelling  . Sudafed [Pseudoephedrine Hcl] Swelling    Lip swelling   Past Medical History  Diagnosis Date  . No pertinent past medical history    Past Surgical History  Procedure Laterality Date  . No past surgeries     History   Social History  . Marital Status: Single    Spouse Name: N/A    Number of Children: N/A  . Years of Education: N/A   Occupational History  . Not on file.   Social History Main Topics  . Smoking status: Never Smoker   . Smokeless tobacco: Never Used  . Alcohol Use: No  . Drug Use: No  . Sexual Activity: No   Other Topics Concern  . Not on file   Social History Narrative  . No narrative on file   Family History  Problem Relation Age of Onset  .  Cancer Mother     breast  . Hypertension Mother   . Thyroid disease Mother   . Early death Father   . Cancer Maternal Grandmother     bladder  . Diabetes Maternal Grandmother   . Hypertension Maternal Grandmother   . COPD Maternal Grandfather   . Heart disease Maternal Grandfather    Patient Active Problem List   Diagnosis Date Noted  . B12 deficiency 03/28/2013  . Rash and nonspecific skin eruption 03/28/2013  . Postpartum care following vaginal delivery (11/7) 05/14/2011  . TINEA CORPORIS 03/03/2010  . ACNE VULGARIS 03/03/2010  . CANDIDIASIS, VAGINAL 11/26/2008  . ANEMIA-NOS 11/26/2008  . DEPRESSION 11/26/2008  . GERD 11/26/2008  . CONSTIPATION, CHRONIC 11/26/2008  . HEADACHE 11/26/2008  . POSITIVE PPD 11/26/2008   No current outpatient prescriptions on file prior to visit.   No current facility-administered medications on file prior to visit.       Review of Systems    see HPI Objective:   Physical Exam Physical Exam  Nursing note and vitals reviewed.  Constitutional: She is oriented to person, place, and time. She  appears well-developed and well-nourished.  HENT:  Head: Normocephalic and atraumatic.  Neck   Thyroid feels enlarged to me on exam  I do not feel a discrete nodule Cardiovascular: Normal rate and regular rhythm. Exam reveals no gallop and no friction rub.  No murmur heard.  Pulmonary/Chest: Breath sounds normal. She has no wheezes. She has no rales.  Neurological: She is alert and oriented to person, place, and time.  Skin: Skin is warm and dry.  She has multiple oval shaped hperpigmentd rash limited to both legs.   No redness no pustusles no blisters Psychiatric: She has a normal mood and affect. Her behavior is normal.             Assessment & Plan:  Thyromegaly  Will get thyroid ultrsound check levels today  Further management based on results  Fatigue   Partly due to sleep disturbance  Shift work,  Child rearing,  Chronic sleep  deprivation  Skin rash  Will refer to derm  History of B12 defieciency   Will check today  If low, may explain paresthesias in feet.    History of pos PPd  Pt reorts she had INH treatment  History of headaches.   Not sure if migraine or other etiology  She is to see me next week

## 2013-03-29 LAB — TSH: TSH: 1.051 u[IU]/mL (ref 0.350–4.500)

## 2013-03-29 LAB — VITAMIN B12: Vitamin B-12: 307 pg/mL (ref 211–911)

## 2013-03-30 ENCOUNTER — Telehealth: Payer: Self-pay | Admitting: *Deleted

## 2013-03-30 ENCOUNTER — Other Ambulatory Visit: Payer: Self-pay | Admitting: Internal Medicine

## 2013-03-30 ENCOUNTER — Encounter: Payer: Self-pay | Admitting: *Deleted

## 2013-03-30 MED ORDER — CHOLECALCIFEROL 1.25 MG (50000 UT) PO TABS: 1.0000 | ORAL_TABLET | ORAL | Status: DC

## 2013-03-30 NOTE — Telephone Encounter (Signed)
LVM message regarding Vit D

## 2013-03-30 NOTE — Telephone Encounter (Signed)
Message copied by Mathews Robinsons on Fri Mar 30, 2013 10:18 AM ------      Message from: Raechel Chute D      Created: Fri Mar 30, 2013 10:12 AM       Karen Kitchens  Call [facility] and let her know that her vitamin D level is very low            I will e-scribe 50,0000 units of vitamin D to be taken once a week for 12 weeks and then she can take OTC vitmain D3 800-10000 units daily ------

## 2013-04-02 ENCOUNTER — Telehealth: Payer: Self-pay | Admitting: *Deleted

## 2013-04-02 NOTE — Telephone Encounter (Signed)
Spoke with pt and informed of thyroid rsults  ADvised her to take her vitamin D as ordered and to make a follow up appt with me

## 2013-04-02 NOTE — Telephone Encounter (Signed)
Nicole Donaldson returned your call on Friday and left a message asking you to call her back. She had questions and wants more information on results.  Also wants to know if she needs to make follow up appt.

## 2013-04-04 ENCOUNTER — Encounter: Payer: Self-pay | Admitting: Internal Medicine

## 2013-04-04 ENCOUNTER — Ambulatory Visit (INDEPENDENT_AMBULATORY_CARE_PROVIDER_SITE_OTHER): Payer: 59 | Admitting: Internal Medicine

## 2013-04-04 VITALS — BP 109/70 | HR 84 | Temp 97.2°F | Resp 18 | Wt 175.0 lb

## 2013-04-04 DIAGNOSIS — E559 Vitamin D deficiency, unspecified: Secondary | ICD-10-CM

## 2013-04-04 DIAGNOSIS — G4700 Insomnia, unspecified: Secondary | ICD-10-CM

## 2013-04-04 DIAGNOSIS — F439 Reaction to severe stress, unspecified: Secondary | ICD-10-CM | POA: Insufficient documentation

## 2013-04-04 DIAGNOSIS — Z733 Stress, not elsewhere classified: Secondary | ICD-10-CM

## 2013-04-04 MED ORDER — LORAZEPAM 1 MG PO TABS: ORAL_TABLET | ORAL | Status: DC

## 2013-04-04 NOTE — Progress Notes (Signed)
Subjective:    Patient ID: Nicole Donaldson, female    DOB: 16-Sep-1975, 37 y.o.   MRN: [id number]  HPI  [name] is here for follow up  All labs normal except low vitamin D and she is taking this once a week  She still has problems with anxiety  Situational in nature.  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 Clarithromycin Swelling    Lip swelling  . Sudafed [Pseudoephedrine Hcl] Swelling    Lip swelling   Past Medical History  Diagnosis Date  . No pertinent past medical history    Past Surgical History  Procedure Laterality Date  . No past surgeries     History   Social History  . Marital Status: Single    Spouse Name: N/A    Number of Children: N/A  . Years of Education: N/A   Occupational History  . Not on file.   Social History Main Topics  . Smoking status: Never Smoker   . Smokeless tobacco: Never Used  . Alcohol Use: No  . Drug Use: No  . Sexual Activity: No   Other Topics Concern  . Not on file   Social History Narrative  . No narrative on file   Family History  Problem Relation Age of Onset  . Cancer Mother     breast  . Hypertension Mother   . Thyroid disease Mother   . Early death Father   . Cancer Maternal Grandmother     bladder  . Diabetes Maternal Grandmother   . Hypertension Maternal Grandmother   . COPD Maternal Grandfather   . Heart disease Maternal Grandfather    Patient Active Problem List   Diagnosis Date Noted  . Situational stress 04/04/2013  . Insomnia 04/04/2013  . B12 deficiency 03/28/2013  . Rash and nonspecific skin eruption 03/28/2013  . Postpartum care following vaginal delivery (11/7) 05/14/2011  . TINEA CORPORIS 03/03/2010  . ACNE VULGARIS 03/03/2010  . CANDIDIASIS, VAGINAL 11/26/2008  . ANEMIA-NOS 11/26/2008  . DEPRESSION 11/26/2008  . GERD 11/26/2008  . CONSTIPATION, CHRONIC 11/26/2008  . HEADACHE 11/26/2008  . POSITIVE PPD 11/26/2008    Current Outpatient Prescriptions on File Prior to Visit  Medication Sig Dispense Refill  . Aspirin-Salicylamide-Caffeine (BC HEADACHE POWDER PO) Take 1 packet by mouth as needed.      . Cholecalciferol 50000 UNITS TABS Take 1 capsule by mouth once a week.  12 tablet  0  . ibuprofen (ADVIL,MOTRIN) 600 MG tablet Take 600 mg by mouth every 6 (six) hours as needed for pain.      . norgestimate-ethinyl estradiol (ORTHO-CYCLEN,SPRINTEC,PREVIFEM) 0.25-35 MG-MCG tablet Take 1 tablet by mouth daily.       No current facility-administered medications on file prior to visit.      Review of Systems    see HPI Objective:   Physical Exam  Physical Exam  Nursing note and vitals reviewed.  Constitutional: She is oriented to person, place, and time. She appears well-developed and well-nourished.  HENT:  Head: Normocephalic and atraumatic.  Cardiovascular: Normal rate and regular rhythm. Exam reveals no gallop and no friction rub.  No murmur heard.  Pulmonary/Chest: Breath sounds normal. She has no wheezes. She has no rales.  Neurological: She is alert and oriented to person, place, and time.  Skin: Skin is warm and dry.  Psychiatric: She has a normal mood and affect. Her behavior is normal.  Assessment & Plan:  Situational stress  OK to use occasional Ativen  1/2 with anxiety and 1/2 or 1 whole no more that 2-3 times a wweek for sleep  Insomnia  See above  Vitamin D deficiency  On weekly D3 when complete can change to [phone] units daily OTC

## 2013-04-04 NOTE — Patient Instructions (Addendum)
Schedule CPE in 2015  You received a flu vaccine today  See me as needed

## 2013-04-18 ENCOUNTER — Telehealth: Payer: Self-pay | Admitting: *Deleted

## 2013-04-18 NOTE — Telephone Encounter (Signed)
Nicole Donaldson called and said she had see Dr Emily Filbert back in 2008 and preferred not to see  Again. Nicole Donaldson has seen a dermatologist in [place] with in the last few years and she prefers to see that Dr again.  I told her she was welcome to call there office and make an appt.  If she needs office notes sent over she will call us back.

## 2013-04-27 ENCOUNTER — Other Ambulatory Visit: Payer: Self-pay | Admitting: Dermatology

## 2013-05-14 ENCOUNTER — Ambulatory Visit: Payer: 59 | Admitting: Internal Medicine

## 2013-05-15 ENCOUNTER — Telehealth: Payer: Self-pay | Admitting: Internal Medicine

## 2013-05-15 ENCOUNTER — Ambulatory Visit (HOSPITAL_BASED_OUTPATIENT_CLINIC_OR_DEPARTMENT_OTHER)
Admission: RE | Admit: 2013-05-15 | Discharge: 2013-05-15 | Disposition: A | Discharge status: Home / Self Care | Payer: 59 | Source: Ambulatory Visit | Attending: Internal Medicine | Admitting: Internal Medicine

## 2013-05-15 ENCOUNTER — Ambulatory Visit (INDEPENDENT_AMBULATORY_CARE_PROVIDER_SITE_OTHER): Payer: 59 | Admitting: Internal Medicine

## 2013-05-15 ENCOUNTER — Encounter: Payer: Self-pay | Admitting: Internal Medicine

## 2013-05-15 VITALS — BP 113/71 | HR 87 | Temp 98.3°F | Resp 18

## 2013-05-15 DIAGNOSIS — I776 Arteritis, unspecified: Secondary | ICD-10-CM

## 2013-05-15 DIAGNOSIS — M7989 Other specified soft tissue disorders: Secondary | ICD-10-CM | POA: Insufficient documentation

## 2013-05-15 DIAGNOSIS — L52 Erythema nodosum: Secondary | ICD-10-CM | POA: Insufficient documentation

## 2013-05-15 DIAGNOSIS — M79609 Pain in unspecified limb: Secondary | ICD-10-CM | POA: Insufficient documentation

## 2013-05-15 MED ORDER — METHYLPREDNISOLONE ACETATE 80 MG/ML IJ SUSP
120.0000 mg | Freq: Once | INTRAMUSCULAR | Status: AC
  Administered 2013-05-15: 120 mg via INTRAMUSCULAR

## 2013-05-15 MED ORDER — PREDNISONE 20 MG PO TABS: ORAL_TABLET | ORAL | Status: DC

## 2013-05-15 NOTE — Telephone Encounter (Signed)
I spoke with Dr. Emily Filbert as pt did not keep her appt in my office on 11/10.  Dr. Emily Filbert will have her staff contact pt as she will need close follow up.    Dr. Emily Filbert and I are in agreement that she will need rheumatology referral.   Dr. Emily Filbert will set up for her

## 2013-05-15 NOTE — Telephone Encounter (Signed)
spoek with pt and informed of ultrasound results    She tells me she has an appt with Dr. Dierdre Forth this thursday

## 2013-05-15 NOTE — Progress Notes (Signed)
Subjective:    Patient ID: Nicole Donaldson, female    DOB: [birth year], 37 y.o.   MRN: [id number]  HPI  Nicole Donaldson is here for acute visit.    She had recently seen her dermatologist for a skin biopsy and pathology showed  Medium vessel vasculitis.  See scanned report.  She is undergoing further work-up by her dermatologist  She denies joint pain  Alopecia, no fever.     She has had long standing headaches that dates back for years  She is concerened over two new lumps on her Left lower leg that are red and firm.  She thought maybe these were insect bites .  Both legs feel achy and "tight"    No chest pain no headache   No visual changes  No fever  No reported oral ulcerations  Allergies  Allergen Reactions  . Clarithromycin Swelling    Lip swelling  . Sudafed [Pseudoephedrine Hcl] Swelling    Lip swelling   Past Medical History  Diagnosis Date  . No pertinent past medical history    Past Surgical History  Procedure Laterality Date  . No past surgeries     History   Social History  . Marital Status: Single    Spouse Name: N/A    Number of Children: N/A  . Years of Education: N/A   Occupational History  . Not on file.   Social History Main Topics  . Smoking status: Never Smoker   . Smokeless tobacco: Never Used  . Alcohol Use: No  . Drug Use: No  . Sexual Activity: No   Other Topics Concern  . Not on file   Social History Narrative  . No narrative on file   Family History  Problem Relation Age of Onset  . Cancer Mother     breast  . Hypertension Mother   . Thyroid disease Mother   . Early death Father   . Cancer Maternal Grandmother     bladder  . Diabetes Maternal Grandmother   . Hypertension Maternal Grandmother   . COPD Maternal Grandfather   . Heart disease Maternal Grandfather    Patient Active Problem List   Diagnosis Date Noted  . Vasculitis 05/15/2013  . Erythema nodosum 05/15/2013  . Situational stress 04/04/2013  . Insomnia 04/04/2013  .  Vitamin D deficiency 04/04/2013  . B12 deficiency 03/28/2013  . Rash and nonspecific skin eruption 03/28/2013  . Postpartum care following vaginal delivery (11/7) 05/14/2011  . TINEA CORPORIS 03/03/2010  . ACNE VULGARIS 03/03/2010  . CANDIDIASIS, VAGINAL 11/26/2008  . ANEMIA-NOS 11/26/2008  . DEPRESSION 11/26/2008  . GERD 11/26/2008  . CONSTIPATION, CHRONIC 11/26/2008  . HEADACHE 11/26/2008  . POSITIVE PPD 11/26/2008   Current Outpatient Prescriptions on File Prior to Visit  Medication Sig Dispense Refill  . Aspirin-Salicylamide-Caffeine (BC HEADACHE POWDER PO) Take 1 packet by mouth as needed.      . Cholecalciferol 50000 UNITS TABS Take 1 capsule by mouth once a week.  12 tablet  0  . ibuprofen (ADVIL,MOTRIN) 600 MG tablet Take 600 mg by mouth every 6 (six) hours as needed for pain.      Marland Kitchen LORazepam (ATIVAN) 1 MG tablet Take 1/2 tablet as needed for anxiety and 1 tab three times a week for sleep  30 tablet  1  . norgestimate-ethinyl estradiol (ORTHO-CYCLEN,SPRINTEC,PREVIFEM) 0.25-35 MG-MCG tablet Take 1 tablet by mouth daily.       No current facility-administered medications on file prior to visit.  Review of Systems   See HPI  Objective:   Physical Exam  Physical Exam  Nursing note and vitals reviewed.  Constitutional: She is oriented to person, place, and time. She appears well-developed and well-nourished.  HENT:  Head: Normocephalic and atraumatic.  Cardiovascular: Normal rate and regular rhythm. Exam reveals no gallop and no friction rub.  No murmur heard.  Pulmonary/Chest: Breath sounds normal. She has no wheezes. She has no rales.  Neurological: She is alert and oriented to person, place, and time.  Skin: Skin is warm and dry.  Psychiatric: She has a normal mood and affect. Her behavior is normal.  Ext:  She has evidence of 2 new reddened and firm masses in her LLE  That appear to be new erythema nodosum.  Good bilateral pedal pulses.  Sensation  intact to both feet.  Old hyperpigmented lesions have unchanged on both LE.  1+ edema bilaterally  Homan's sign positive on the left.     Assessment & Plan:  New onset vasculitis:  Lesions appear to be new lesions of erythema nodosum.  Work up pending with dermatologist to assess if systemic vs cutaneous.    Will give depomedrol 120 mg here in office  And prednisone 60 mg taper by 20 mg q 3days.  Will refer to Rheumatology.     Le edema  Will get doppler today   Addendum  Doppler neg for DVT  .  I spoke with Dr. Dierdre Forth and he will have his office call this week to give pt an appt.

## 2013-05-15 NOTE — Patient Instructions (Signed)
To get prednisone to day and take as directed  Will call you with results of doppler and refer you to rheumatologist

## 2013-05-22 ENCOUNTER — Encounter: Payer: Self-pay | Admitting: *Deleted

## 2013-11-07 ENCOUNTER — Telehealth: Payer: Self-pay | Admitting: *Deleted

## 2013-11-07 NOTE — Telephone Encounter (Signed)
Debara called she said she is staying really tired again.  Last time she was put on Vitamin D.  She wants to know if there is something she can take over the counter or a Rx?

## 2013-11-08 NOTE — Telephone Encounter (Signed)
Advised pt to take OTC vit D 2000 units daily and appt made to recheck Vit D

## 2013-11-14 ENCOUNTER — Ambulatory Visit: Payer: 59 | Admitting: Internal Medicine

## 2013-11-21 ENCOUNTER — Ambulatory Visit (INDEPENDENT_AMBULATORY_CARE_PROVIDER_SITE_OTHER): Payer: 59 | Admitting: Internal Medicine

## 2013-11-21 ENCOUNTER — Encounter: Payer: Self-pay | Admitting: Internal Medicine

## 2013-11-21 VITALS — BP 112/74 | HR 104 | Temp 98.1°F | Resp 18 | Ht 65.0 in | Wt 182.0 lb

## 2013-11-21 DIAGNOSIS — E538 Deficiency of other specified B group vitamins: Secondary | ICD-10-CM

## 2013-11-21 DIAGNOSIS — I776 Arteritis, unspecified: Secondary | ICD-10-CM

## 2013-11-21 DIAGNOSIS — E559 Vitamin D deficiency, unspecified: Secondary | ICD-10-CM

## 2013-11-21 LAB — VITAMIN B12: VITAMIN B 12: 260 pg/mL (ref 211–911)

## 2013-11-21 NOTE — Progress Notes (Signed)
Subjective:    Patient ID: Nicole Donaldson, female    DOB: August 24, 1975, 38 y.o.   MRN: [id number]  HPI  Nicole Donaldson is here for acute visit .  I have not seen her in a while  She had new onset vasculitis - and is followed by Dr. Amil Amen  She reports her CXR was negative for any indication of sarcoid.  She is off her Oc's now and is using condoms for contraception.    She has been out of her vitamin D and has not been taking OTC  .  She tells me she has been B12 deficient in the past  She needs a new dermatologist  As Dr. Maurie Boettcher office will not see her anymore.   She has not had any new skin nodules.  She has been seen at [facility] in past    Allergies  Allergen Reactions  . Clarithromycin Swelling    Lip swelling  . Sudafed [Pseudoephedrine Hcl] Swelling    Lip swelling   Past Medical History  Diagnosis Date  . No pertinent past medical history    Past Surgical History  Procedure Laterality Date  . No past surgeries     History   Social History  . Marital Status: Single    Spouse Name: N/A    Number of Children: N/A  . Years of Education: N/A   Occupational History  . Not on file.   Social History Main Topics  . Smoking status: Never Smoker   . Smokeless tobacco: Never Used  . Alcohol Use: No  . Drug Use: No  . Sexual Activity: No   Other Topics Concern  . Not on file   Social History Narrative  . No narrative on file   Family History  Problem Relation Age of Onset  . Cancer Mother     breast  . Hypertension Mother   . Thyroid disease Mother   . Early death Father   . Cancer Maternal Grandmother     bladder  . Diabetes Maternal Grandmother   . Hypertension Maternal Grandmother   . COPD Maternal Grandfather   . Heart disease Maternal Grandfather    Patient Active Problem List   Diagnosis Date Noted  . Vitamin B12 deficiency 11/21/2013  . Vasculitis 05/15/2013  . Erythema nodosum 05/15/2013  . Situational stress 04/04/2013  . Insomnia 04/04/2013  .  Vitamin D deficiency 04/04/2013  . B12 deficiency 03/28/2013  . Rash and nonspecific skin eruption 03/28/2013  . Postpartum care following vaginal delivery (11/7) 05/14/2011  . TINEA CORPORIS 03/03/2010  . ACNE VULGARIS 03/03/2010  . CANDIDIASIS, VAGINAL 11/26/2008  . ANEMIA-NOS 11/26/2008  . DEPRESSION 11/26/2008  . GERD 11/26/2008  . CONSTIPATION, CHRONIC 11/26/2008  . HEADACHE 11/26/2008  . POSITIVE PPD 11/26/2008   Current Outpatient Prescriptions on File Prior to Visit  Medication Sig Dispense Refill  . Aspirin-Salicylamide-Caffeine (BC HEADACHE POWDER PO) Take 1 packet by mouth as needed.      Marland Kitchen ibuprofen (ADVIL,MOTRIN) 600 MG tablet Take 600 mg by mouth every 6 (six) hours as needed for pain.      Marland Kitchen LORazepam (ATIVAN) 1 MG tablet Take 1/2 tablet as needed for anxiety and 1 tab three times a week for sleep  30 tablet  1  . Cholecalciferol 50000 UNITS TABS Take 1 capsule by mouth once a week.  12 tablet  0   No current facility-administered medications on file prior to visit.       Review of Systems See  HPI    Objective:   Physical Exam Physical Exam  Nursing note and vitals reviewed.  Constitutional: She is oriented to person, place, and time. She appears well-developed and well-nourished.  HENT:  Head: Normocephalic and atraumatic.  Cardiovascular: Normal rate and regular rhythm. Exam reveals no gallop and no friction rub.  No murmur heard.  Pulmonary/Chest: Breath sounds normal. She has no wheezes. She has no rales.  Neurological: She is alert and oriented to person, place, and time.  Skin: Skin is warm and dry.  Few flat hyperpigmented macules lower legs Psychiatric: She has a normal mood and affect. Her behavior is normal.        Assessment & Plan:  Vasculitis  Will refer to new dermatologist   .  She does have appt in August with [facility]  Vitmain D deficiency will check today  Vitamin B12 deficiency  Will check today

## 2013-11-21 NOTE — Patient Instructions (Addendum)
Set up appointment with [facility] dermatology       Take 2000 units of vitamin D daily

## 2013-11-22 ENCOUNTER — Ambulatory Visit: Payer: 59 | Admitting: Internal Medicine

## 2013-11-22 ENCOUNTER — Encounter: Payer: Self-pay | Admitting: Internal Medicine

## 2013-11-22 LAB — VITAMIN D 25 HYDROXY (VIT D DEFICIENCY, FRACTURES): VIT D 25 HYDROXY: 18 ng/mL — AB (ref 30–89)

## 2013-11-28 ENCOUNTER — Telehealth: Payer: Self-pay | Admitting: *Deleted

## 2013-11-28 NOTE — Telephone Encounter (Signed)
Advised pt to take 2000 units of Vit D

## 2014-01-24 ENCOUNTER — Ambulatory Visit (INDEPENDENT_AMBULATORY_CARE_PROVIDER_SITE_OTHER): Payer: 59 | Admitting: Internal Medicine

## 2014-01-24 ENCOUNTER — Encounter: Payer: Self-pay | Admitting: Internal Medicine

## 2014-01-24 VITALS — BP 112/73 | HR 100 | Ht 65.0 in | Wt 184.0 lb

## 2014-01-24 DIAGNOSIS — L304 Erythema intertrigo: Secondary | ICD-10-CM

## 2014-01-24 DIAGNOSIS — E559 Vitamin D deficiency, unspecified: Secondary | ICD-10-CM

## 2014-01-24 DIAGNOSIS — L538 Other specified erythematous conditions: Secondary | ICD-10-CM

## 2014-01-24 DIAGNOSIS — L52 Erythema nodosum: Secondary | ICD-10-CM

## 2014-01-24 MED ORDER — NYSTATIN-TRIAMCINOLONE 100000-0.1 UNIT/GM-% EX OINT: 1.0000 "application " | TOPICAL_OINTMENT | Freq: Two times a day (BID) | CUTANEOUS | Status: DC

## 2014-01-24 MED ORDER — LORAZEPAM 1 MG PO TABS: ORAL_TABLET | ORAL | Status: DC

## 2014-01-24 NOTE — Progress Notes (Signed)
Subjective:    Patient ID: Nicole Donaldson, female    DOB: Feb 05, 1976, 38 y.o.   MRN: [id number]  HPI Nicole Donaldson is here for acute visit.    Has itchy red bilateral axillary rash for several days.   Vasculitis :   She has an appt with new dermatologist at [facility] 8/31  Hyperpigmented lesions both lowe legs improving    Vitamin D deficiency:  She is not taking a daily supplement now  Anxiety/insomnia  Uses ativan rarely but it really helps irritability    Allergies  Allergen Reactions  . Clarithromycin Swelling    Lip swelling  . Sudafed [Pseudoephedrine Hcl] Swelling    Lip swelling   Past Medical History  Diagnosis Date  . No pertinent past medical history    Past Surgical History  Procedure Laterality Date  . No past surgeries     History   Social History  . Marital Status: Single    Spouse Name: N/A    Number of Children: N/A  . Years of Education: N/A   Occupational History  . Not on file.   Social History Main Topics  . Smoking status: Never Smoker   . Smokeless tobacco: Never Used  . Alcohol Use: No  . Drug Use: No  . Sexual Activity: No   Other Topics Concern  . Not on file   Social History Narrative  . No narrative on file   Family History  Problem Relation Age of Onset  . Cancer Mother     breast  . Hypertension Mother   . Thyroid disease Mother   . Early death Father   . Cancer Maternal Grandmother     bladder  . Diabetes Maternal Grandmother   . Hypertension Maternal Grandmother   . COPD Maternal Grandfather   . Heart disease Maternal Grandfather    Patient Active Problem List   Diagnosis Date Noted  . Vitamin B12 deficiency 11/21/2013  . Vasculitis 05/15/2013  . Erythema nodosum 05/15/2013  . Situational stress 04/04/2013  . Insomnia 04/04/2013  . Vitamin D deficiency 04/04/2013  . B12 deficiency 03/28/2013  . Rash and nonspecific skin eruption 03/28/2013  . Postpartum care following vaginal delivery (11/7) 05/14/2011  . TINEA  CORPORIS 03/03/2010  . ACNE VULGARIS 03/03/2010  . CANDIDIASIS, VAGINAL 11/26/2008  . ANEMIA-NOS 11/26/2008  . DEPRESSION 11/26/2008  . GERD 11/26/2008  . CONSTIPATION, CHRONIC 11/26/2008  . HEADACHE 11/26/2008  . POSITIVE PPD 11/26/2008   Current Outpatient Prescriptions on File Prior to Visit  Medication Sig Dispense Refill  . Aspirin-Salicylamide-Caffeine (BC HEADACHE POWDER PO) Take 1 packet by mouth as needed.      Marland Kitchen LORazepam (ATIVAN) 1 MG tablet Take 1/2 tablet as needed for anxiety and 1 tab three times a week for sleep  30 tablet  1  . Cholecalciferol 50000 UNITS TABS Take 1 capsule by mouth once a week.  12 tablet  0  . ibuprofen (ADVIL,MOTRIN) 600 MG tablet Take 600 mg by mouth every 6 (six) hours as needed for pain.       No current facility-administered medications on file prior to visit.       Review of Systems    see HPI Objective:   Physical Exam Physical Exam  Nursing note and vitals reviewed.  Constitutional: She is oriented to person, place, and time. She appears well-developed and well-nourished.  HENT:  Head: Normocephalic and atraumatic.  Cardiovascular: Normal rate and regular rhythm. Exam reveals no gallop and no friction rub.  No murmur heard.  Pulmonary/Chest: Breath sounds normal. She has no wheezes. She has no rales.  Neurological: She is alert and oriented to person, place, and time.  Skin: Skin is warm and dry.  Axialla  She has reddedend maculopapular rash both axilla  Psychiatric: She has a normal mood and affect. Her behavior is normal.              Assessment & Plan:  Intertriginous rash:   Mycolog bid  Vasculitis with Erythema nodosum.  Keep appt with derm at [facility]   Vitamin D def  Advised 1000-2000 units daily  Anxiety/insomnia   Ok for prn Ativan

## 2014-03-04 DIAGNOSIS — L739 Follicular disorder, unspecified: Secondary | ICD-10-CM | POA: Insufficient documentation

## 2014-05-02 ENCOUNTER — Ambulatory Visit (INDEPENDENT_AMBULATORY_CARE_PROVIDER_SITE_OTHER): Payer: 59 | Admitting: *Deleted

## 2014-05-02 DIAGNOSIS — Z23 Encounter for immunization: Secondary | ICD-10-CM

## 2014-05-06 ENCOUNTER — Encounter: Payer: Self-pay | Admitting: Internal Medicine

## 2014-07-23 ENCOUNTER — Telehealth: Payer: Self-pay | Admitting: *Deleted

## 2014-07-23 NOTE — Telephone Encounter (Signed)
Pt called in stating she was seen at dermatology and had labs drawn there. Nurse from derm office called her about being anemic and needed to follow up with PCP. Once labs are rcvd pt to call back to schedule office visit.

## 2014-07-31 ENCOUNTER — Ambulatory Visit (INDEPENDENT_AMBULATORY_CARE_PROVIDER_SITE_OTHER): Payer: 59 | Admitting: Internal Medicine

## 2014-07-31 ENCOUNTER — Encounter: Payer: Self-pay | Admitting: Internal Medicine

## 2014-07-31 VITALS — BP 111/76 | HR 114 | Resp 16 | Ht 65.5 in | Wt 187.0 lb

## 2014-07-31 DIAGNOSIS — I776 Arteritis, unspecified: Secondary | ICD-10-CM

## 2014-07-31 DIAGNOSIS — D508 Other iron deficiency anemias: Secondary | ICD-10-CM

## 2014-07-31 MED ORDER — INTEGRA 62.5-62.5-40-3 MG PO CAPS: ORAL_CAPSULE | ORAL | Status: DC

## 2014-07-31 NOTE — Progress Notes (Signed)
Subjective:    Patient ID: Nicole Donaldson, female    DOB: 1976/01/11, 39 y.o.   MRN: [id number]  HPI  01/2014 note Intertriginous rash: Mycolog bid  Vasculitis with Erythema nodosum. Keep appt with derm at [facility]   Vitamin D def Advised 1000-2000 units daily  Anxiety/insomnia Ok for prn Ativan  TODAY    Nicole Donaldson is here for follow up  She had been seen 1/13 by her dermatologist for follow up of vasculitis and was found to be anemic with HGB 10.9 with microcytic indices.  See scanned labs of 1/15.    Pt has had heavy menses in the past but not now.   Formerly on [place] but not now. She denies heavy flow currently   Allergies  Allergen Reactions  . Clarithromycin Swelling    Lip swelling  . Sudafed [Pseudoephedrine Hcl] Swelling    Lip swelling   Past Medical History  Diagnosis Date  . No pertinent past medical history    Past Surgical History  Procedure Laterality Date  . No past surgeries     History   Social History  . Marital Status: Single    Spouse Name: N/A    Number of Children: N/A  . Years of Education: N/A   Occupational History  . Not on file.   Social History Main Topics  . Smoking status: Never Smoker   . Smokeless tobacco: Never Used  . Alcohol Use: No  . Drug Use: No  . Sexual Activity: No   Other Topics Concern  . Not on file   Social History Narrative   Family History  Problem Relation Age of Onset  . Cancer Mother     breast  . Hypertension Mother   . Thyroid disease Mother   . Early death Father   . Cancer Maternal Grandmother     bladder  . Diabetes Maternal Grandmother   . Hypertension Maternal Grandmother   . COPD Maternal Grandfather   . Heart disease Maternal Grandfather    Patient Active Problem List   Diagnosis Date Noted  . Vitamin B12 deficiency 11/21/2013  . Vasculitis 05/15/2013  . Erythema nodosum 05/15/2013  . Situational stress 04/04/2013  . Insomnia 04/04/2013  . Vitamin D deficiency 04/04/2013  . B12  deficiency 03/28/2013  . Rash and nonspecific skin eruption 03/28/2013  . Postpartum care following vaginal delivery (11/7) 05/14/2011  . TINEA CORPORIS 03/03/2010  . ACNE VULGARIS 03/03/2010  . CANDIDIASIS, VAGINAL 11/26/2008  . ANEMIA-NOS 11/26/2008  . DEPRESSION 11/26/2008  . GERD 11/26/2008  . CONSTIPATION, CHRONIC 11/26/2008  . HEADACHE 11/26/2008  . POSITIVE PPD 11/26/2008   Current Outpatient Prescriptions on File Prior to Visit  Medication Sig Dispense Refill  . Aspirin-Salicylamide-Caffeine (BC HEADACHE POWDER PO) Take 1 packet by mouth as needed.    . Cholecalciferol 50000 UNITS TABS Take 1 capsule by mouth once a week. 12 tablet 0  . ibuprofen (ADVIL,MOTRIN) 600 MG tablet Take 600 mg by mouth every 6 (six) hours as needed for pain.    Marland Kitchen LORazepam (ATIVAN) 1 MG tablet Take 1/2 tablet as needed for anxiety and 1 tab three times a week for sleep 30 tablet 1  . nystatin-triamcinolone ointment (MYCOLOG) Apply 1 application topically 2 (two) times daily. Apply to rash tid for 2 days then bid 30 g 1   No current facility-administered medications on file prior to visit.      Review of Systems See HPI    Objective:   Physical Exam  Physical Exam  Nursing note and vitals reviewed.  Constitutional: She is oriented to person, place, and time. She appears well-developed and well-nourished.  HENT:  Head: Normocephalic and atraumatic.  Cardiovascular: Normal rate and regular rhythm. Exam reveals no gallop and no friction rub.  No murmur heard.  Pulmonary/Chest: Breath sounds normal. She has no wheezes. She has no rales.  Neurological: She is alert and oriented to person, place, and time.  Skin: Skin is warm and dry.  Psychiatric: She has a normal mood and affect. Her behavior is normal.        Assessment & Plan:  Anemia:  Will check anemia panel.    RX integra one daily  See me in 8 weeks.  Does not sound like this is menses related  May be chronic related to  vasculitis  Vasculitis  She has not seen her rheumatologist in quite a while.  I advised her to make appt with him  .  She voices understanding.   Will fax labs to him as well.   See me 8 weeks or sooner prn

## 2014-08-01 ENCOUNTER — Encounter: Payer: Self-pay | Admitting: *Deleted

## 2014-08-15 ENCOUNTER — Other Ambulatory Visit: Payer: Self-pay | Admitting: Internal Medicine

## 2014-08-15 LAB — IRON AND TIBC
%SAT: 10 % — ABNORMAL LOW (ref 20–55)
Iron: 37 ug/dL — ABNORMAL LOW (ref 42–145)
TIBC: 356 ug/dL (ref 250–470)
UIBC: 319 ug/dL (ref 125–400)

## 2014-08-15 LAB — ANEMIA PANEL 7
ABS Retic: 41.7 10*3/uL (ref 19.0–186.0)
Ferritin: 20 ng/mL (ref 10–291)
Folate: 15.1 ng/mL
HEMATOCRIT: 34.4 % — AB (ref 36.0–46.0)
Hemoglobin: 10.8 g/dL — ABNORMAL LOW (ref 12.0–15.0)
MCH: 25.9 pg — ABNORMAL LOW (ref 26.0–34.0)
MCHC: 31.4 g/dL (ref 30.0–36.0)
MCV: 82.5 fL (ref 78.0–100.0)
MPV: 9.5 fL (ref 8.6–12.4)
PLATELETS: 363 10*3/uL (ref 150–400)
RBC.: 4.17 MIL/uL (ref 3.87–5.11)
RBC: 4.17 MIL/uL (ref 3.87–5.11)
RDW: 15.4 % (ref 11.5–15.5)
Retic Ct Pct: 1 % (ref 0.4–2.3)
Vitamin B-12: 483 pg/mL (ref 211–911)
WBC: 6.3 10*3/uL (ref 4.0–10.5)

## 2014-08-15 LAB — LIPID PANEL
Cholesterol: 162 mg/dL (ref 0–200)
HDL: 64 mg/dL (ref >39)
LDL CALC: 88 mg/dL (ref 0–99)
Total CHOL/HDL Ratio: 2.5 Ratio
Triglycerides: 50 mg/dL (ref <150)
VLDL: 10 mg/dL (ref 0–40)

## 2014-08-15 LAB — CBC WITH DIFFERENTIAL/PLATELET
Basophils Absolute: 0 10*3/uL (ref 0.0–0.1)
Basophils Relative: 0 % (ref 0–1)
EOS ABS: 0.1 10*3/uL (ref 0.0–0.7)
Eosinophils Relative: 2 % (ref 0–5)
HCT: 34.4 % — ABNORMAL LOW (ref 36.0–46.0)
HEMOGLOBIN: 10.8 g/dL — AB (ref 12.0–15.0)
LYMPHS PCT: 30 % (ref 12–46)
Lymphs Abs: 1.9 10*3/uL (ref 0.7–4.0)
MCH: 25.9 pg — ABNORMAL LOW (ref 26.0–34.0)
MCHC: 31.4 g/dL (ref 30.0–36.0)
MCV: 82.5 fL (ref 78.0–100.0)
MONO ABS: 0.6 10*3/uL (ref 0.1–1.0)
MONOS PCT: 9 % (ref 3–12)
MPV: 9.5 fL (ref 8.6–12.4)
NEUTROS ABS: 3.7 10*3/uL (ref 1.7–7.7)
Neutrophils Relative %: 59 % (ref 43–77)
Platelets: 363 10*3/uL (ref 150–400)
RBC: 4.17 MIL/uL (ref 3.87–5.11)
RDW: 15.4 % (ref 11.5–15.5)
WBC: 6.3 10*3/uL (ref 4.0–10.5)

## 2014-08-15 LAB — COMPREHENSIVE METABOLIC PANEL
ALT: 8 U/L (ref 0–35)
AST: 13 U/L (ref 0–37)
Albumin: 3.8 g/dL (ref 3.5–5.2)
Alkaline Phosphatase: 65 U/L (ref 39–117)
BUN: 15 mg/dL (ref 6–23)
CO2: 26 mEq/L (ref 19–32)
CREATININE: 0.88 mg/dL (ref 0.50–1.10)
Calcium: 9 mg/dL (ref 8.4–10.5)
Chloride: 103 mEq/L (ref 96–112)
Glucose, Bld: 100 mg/dL — ABNORMAL HIGH (ref 70–99)
Potassium: 3.7 mEq/L (ref 3.5–5.3)
Sodium: 139 mEq/L (ref 135–145)
Total Bilirubin: 0.4 mg/dL (ref 0.2–1.2)
Total Protein: 6.9 g/dL (ref 6.0–8.3)

## 2014-08-15 LAB — TSH: TSH: 2.26 u[IU]/mL (ref 0.350–4.500)

## 2014-08-16 LAB — VITAMIN D 25 HYDROXY (VIT D DEFICIENCY, FRACTURES): Vit D, 25-Hydroxy: 17 ng/mL — ABNORMAL LOW (ref 30–100)

## 2014-08-20 ENCOUNTER — Telehealth: Payer: Self-pay | Admitting: Internal Medicine

## 2014-08-20 NOTE — Telephone Encounter (Signed)
Left message on pts mobile to call office regarding lab results

## 2014-08-21 ENCOUNTER — Telehealth: Payer: Self-pay | Admitting: Internal Medicine

## 2014-08-21 MED ORDER — VITAMIN D (ERGOCALCIFEROL) 1.25 MG (50000 UNIT) PO CAPS: ORAL_CAPSULE | ORAL | Status: DC

## 2014-08-21 NOTE — Telephone Encounter (Signed)
Spoke with pt and informed of lab results  She has been off her OC's since last October.  Occasionally heavy menses with clots but not very often.  She is taking integra now  She denies daily use of Goody powder or Nsaids  She will see me in 6-7 weeks and will re-evlauate at that time  Vitamin D is low she does not take vitamin D despite my counseling that she will need it every day  . Will start over with 50,000 units weekly for 12 weeks then 2000 units OTC daily.  Pt voices understanding

## 2014-10-14 ENCOUNTER — Telehealth: Payer: Self-pay | Admitting: Internal Medicine

## 2014-10-14 NOTE — Telephone Encounter (Signed)
Nicole Donaldson  Call pt and give her a 30 min OV with me .  ADvised her that I need to recheck her anemia

## 2014-11-06 ENCOUNTER — Encounter: Payer: Self-pay | Admitting: Internal Medicine

## 2014-11-12 ENCOUNTER — Encounter: Payer: Self-pay | Admitting: Internal Medicine

## 2014-11-12 ENCOUNTER — Ambulatory Visit (INDEPENDENT_AMBULATORY_CARE_PROVIDER_SITE_OTHER): Payer: 59 | Admitting: Family Medicine

## 2014-11-12 ENCOUNTER — Encounter: Payer: Self-pay | Admitting: Family Medicine

## 2014-11-12 ENCOUNTER — Ambulatory Visit (INDEPENDENT_AMBULATORY_CARE_PROVIDER_SITE_OTHER): Payer: 59 | Admitting: Internal Medicine

## 2014-11-12 VITALS — BP 99/68 | HR 77 | Ht 66.0 in | Wt 188.0 lb

## 2014-11-12 VITALS — BP 108/70 | HR 89 | Resp 16 | Ht 66.0 in | Wt 188.0 lb

## 2014-11-12 DIAGNOSIS — E559 Vitamin D deficiency, unspecified: Secondary | ICD-10-CM | POA: Diagnosis not present

## 2014-11-12 DIAGNOSIS — D509 Iron deficiency anemia, unspecified: Secondary | ICD-10-CM | POA: Diagnosis not present

## 2014-11-12 DIAGNOSIS — M542 Cervicalgia: Secondary | ICD-10-CM | POA: Diagnosis not present

## 2014-11-12 DIAGNOSIS — D649 Anemia, unspecified: Secondary | ICD-10-CM

## 2014-11-12 DIAGNOSIS — L959 Vasculitis limited to the skin, unspecified: Secondary | ICD-10-CM | POA: Diagnosis not present

## 2014-11-12 LAB — CBC WITH DIFFERENTIAL/PLATELET
BASOS PCT: 0 % (ref 0–1)
Basophils Absolute: 0 10*3/uL (ref 0.0–0.1)
Eosinophils Absolute: 0.1 10*3/uL (ref 0.0–0.7)
Eosinophils Relative: 1 % (ref 0–5)
HCT: 35 % — ABNORMAL LOW (ref 36.0–46.0)
Hemoglobin: 11 g/dL — ABNORMAL LOW (ref 12.0–15.0)
Lymphocytes Relative: 28 % (ref 12–46)
Lymphs Abs: 1.6 10*3/uL (ref 0.7–4.0)
MCH: 25.9 pg — ABNORMAL LOW (ref 26.0–34.0)
MCHC: 31.4 g/dL (ref 30.0–36.0)
MCV: 82.4 fL (ref 78.0–100.0)
MPV: 9.5 fL (ref 8.6–12.4)
Monocytes Absolute: 0.4 10*3/uL (ref 0.1–1.0)
Monocytes Relative: 8 % (ref 3–12)
NEUTROS ABS: 3.5 10*3/uL (ref 1.7–7.7)
NEUTROS PCT: 63 % (ref 43–77)
PLATELETS: 368 10*3/uL (ref 150–400)
RBC: 4.25 MIL/uL (ref 3.87–5.11)
RDW: 15.7 % — AB (ref 11.5–15.5)
WBC: 5.6 10*3/uL (ref 4.0–10.5)

## 2014-11-12 LAB — VITAMIN B12: VITAMIN B 12: 348 pg/mL (ref 211–911)

## 2014-11-12 MED ORDER — HYDROCODONE-ACETAMINOPHEN 5-325 MG PO TABS: 1.0000 | ORAL_TABLET | Freq: Four times a day (QID) | ORAL | Status: DC | PRN

## 2014-11-12 MED ORDER — INTEGRA 62.5-62.5-40-3 MG PO CAPS: ORAL_CAPSULE | ORAL | Status: DC

## 2014-11-12 NOTE — Patient Instructions (Signed)
You have cervical/trapezius strain. Take ibuprofen 600mg  three times a day with food for pain and inflammation Consider robaxin for muscle spasms. Norco as needed for severe pain (no driving on this medicine). Consider cervical collar if severely painful. Simple range of motion exercises within limits of pain to prevent further stiffness. Consider physical therapy for stretching, exercises, traction, and modalities. Heat 15 minutes at a time 3-4 times a day to help with spasms. Watch head position when on computers, texting, when sleeping in bed - should in line with back to prevent further spasms. Follow up with me in 1 month.

## 2014-11-12 NOTE — Progress Notes (Signed)
Subjective:    Patient ID: Nicole Donaldson, female    DOB: 1975-10-06, 39 y.o.   MRN: [id number]  HPI  07/31/2014 Assessment & Plan:  Anemia: Will check anemia panel. RX integra one daily See me in 8 weeks. Does not sound like this is menses related May be chronic related to vasculitis  Vasculitis She has not seen her rheumatologist in quite a while. I advised her to make appt with him . She voices understanding. Will fax labs to him as well.   See me 8 weeks or sooner prn         TODAY  Tajah returns for follow up    She is taking her supplemental Iron .  Ferritin low end of normal.  She has completed her high dose vitamin D but is not taking her OTC vitamin D    She has not seen her rheumatologist as she was advised to do on our last visit   Allergies  Allergen Reactions  . Clarithromycin Swelling    Lip swelling  . Sudafed [Pseudoephedrine Hcl] Swelling    Lip swelling   Past Medical History  Diagnosis Date  . No pertinent past medical history    Past Surgical History  Procedure Laterality Date  . No past surgeries     History   Social History  . Marital Status: Single    Spouse Name: N/A  . Number of Children: N/A  . Years of Education: N/A   Occupational History  . Not on file.   Social History Main Topics  . Smoking status: Never Smoker   . Smokeless tobacco: Never Used  . Alcohol Use: No  . Drug Use: No  . Sexual Activity: No   Other Topics Concern  . Not on file   Social History Narrative   Family History  Problem Relation Age of Onset  . Cancer Mother     breast  . Hypertension Mother   . Thyroid disease Mother   . Early death Father   . Cancer Maternal Grandmother     bladder  . Diabetes Maternal Grandmother   . Hypertension Maternal Grandmother   . COPD Maternal Grandfather   . Heart disease Maternal Grandfather    Patient Active Problem List   Diagnosis Date Noted  . Vitamin B12 deficiency 11/21/2013  . Vasculitis  05/15/2013  . Erythema nodosum 05/15/2013  . Situational stress 04/04/2013  . Insomnia 04/04/2013  . Vitamin D deficiency 04/04/2013  . B12 deficiency 03/28/2013  . Rash and nonspecific skin eruption 03/28/2013  . Postpartum care following vaginal delivery (11/7) 05/14/2011  . TINEA CORPORIS 03/03/2010  . ACNE VULGARIS 03/03/2010  . CANDIDIASIS, VAGINAL 11/26/2008  . ANEMIA-NOS 11/26/2008  . DEPRESSION 11/26/2008  . GERD 11/26/2008  . CONSTIPATION, CHRONIC 11/26/2008  . HEADACHE 11/26/2008  . POSITIVE PPD 11/26/2008   Current Outpatient Prescriptions on File Prior to Visit  Medication Sig Dispense Refill  . Aspirin-Salicylamide-Caffeine (BC HEADACHE POWDER PO) Take 1 packet by mouth as needed.    . Cholecalciferol 50000 UNITS TABS Take 1 capsule by mouth once a week. 12 tablet 0  . Fe Fum-FePoly-Vit C-Vit B3 (INTEGRA) 62.5-62.5-40-3 MG CAPS Take one daily with food  Give generic 30 capsule 5  . ibuprofen (ADVIL,MOTRIN) 600 MG tablet Take 600 mg by mouth every 6 (six) hours as needed for pain.    Marland Kitchen LORazepam (ATIVAN) 1 MG tablet Take 1/2 tablet as needed for anxiety and 1 tab three times a week for  sleep 30 tablet 1  . nystatin-triamcinolone ointment (MYCOLOG) Apply 1 application topically 2 (two) times daily. Apply to rash tid for 2 days then bid 30 g 1  . Vitamin D, Ergocalciferol, (DRISDOL) 50000 UNITS CAPS capsule Take one capsule once a week for 12 weeks then 2000 units daily 12 capsule 0   No current facility-administered medications on file prior to visit.      Review of Systems See HPI    Objective:   Physical Exam Physical Exam  Nursing note and vitals reviewed.  Constitutional: She is oriented to person, place, and time. She appears well-developed and well-nourished.  HENT:  Head: Normocephalic and atraumatic.  Cardiovascular: Normal rate and regular rhythm. Exam reveals no gallop and no friction rub.  No murmur heard.  Pulmonary/Chest: Breath sounds normal. She  has no wheezes. She has no rales.  Neurological: She is alert and oriented to person, place, and time.  Skin: Skin is warm and dry.   Erythema nodosum not present on exam today but she has inflammatory macules both LE  Psychiatric: She has a normal mood and affect. Her behavior is normal.        Assessment & Plan:  Anemia:   Likely multifactorial with low FE stores and she has a history of vasculitis found on skin biopsy .  Will check CBC,  And B12  Erythema nodosum  Greatly improved   Vitamin D deficiency  Will check today  She has 5000 units of vitamin D at home.  ADvised to take 5000 units once or twice a week    Vasculitis on skin BX  Pt given number to her rheumatologist Dr. Amil Amen and again advised to make appt with him

## 2014-11-13 ENCOUNTER — Telehealth: Payer: Self-pay | Admitting: *Deleted

## 2014-11-13 DIAGNOSIS — M542 Cervicalgia: Secondary | ICD-10-CM | POA: Insufficient documentation

## 2014-11-13 LAB — VITAMIN D 25 HYDROXY (VIT D DEFICIENCY, FRACTURES): Vit D, 25-Hydroxy: 24 ng/mL — ABNORMAL LOW (ref 30–100)

## 2014-11-13 NOTE — Telephone Encounter (Signed)
I spoke with Nicole Donaldson in regards to her lab results. I also faxed a copy to Dr. Melissa Noon office -eh

## 2014-11-13 NOTE — Assessment & Plan Note (Signed)
Cervical/trapezius strain - ibuprofen with norco as needed.  Shown home exercises to do daily - will consider physical therapy.  Heat for spasms.  Ergonomic issues discussed.  F/u in 1 month.

## 2014-11-13 NOTE — Progress Notes (Signed)
PCP: SCHOENHOFF,DEBBIE, MD  Subjective:   HPI: Patient is a 39 y.o. female here for left shoulder pain.  Patient denies known trauma or injury. She states about 2 weeks ago she started to get pain in posterior aspect of left shoulder/neck area. Has history of pinched nerve about 8-9 years ago. Resolved following PT. Currently taking ibuprofen. No numbness or tingling. No radiation into the left arm. Pain 3/10 at rest, 6/43 with certain movements of neck.  Past Medical History  Diagnosis Date  . No pertinent past medical history     Current Outpatient Prescriptions on File Prior to Visit  Medication Sig Dispense Refill  . Aspirin-Salicylamide-Caffeine (BC HEADACHE POWDER PO) Take 1 packet by mouth as needed.    . Cholecalciferol 50000 UNITS TABS Take 1 capsule by mouth once a week. 12 tablet 0  . Fe Fum-FePoly-Vit C-Vit B3 (INTEGRA) 62.5-62.5-40-3 MG CAPS Take one daily with food  Give generic 90 capsule 1  . ibuprofen (ADVIL,MOTRIN) 600 MG tablet Take 600 mg by mouth every 6 (six) hours as needed for pain.    Marland Kitchen LORazepam (ATIVAN) 1 MG tablet Take 1/2 tablet as needed for anxiety and 1 tab three times a week for sleep 30 tablet 1  . nystatin-triamcinolone ointment (MYCOLOG) Apply 1 application topically 2 (two) times daily. Apply to rash tid for 2 days then bid 30 g 1  . Vitamin D, Ergocalciferol, (DRISDOL) 50000 UNITS CAPS capsule Take one capsule once a week for 12 weeks then 2000 units daily 12 capsule 0   No current facility-administered medications on file prior to visit.    Past Surgical History  Procedure Laterality Date  . No past surgeries      Allergies  Allergen Reactions  . Clarithromycin Swelling    Lip swelling  . Sudafed [Pseudoephedrine Hcl] Swelling    Lip swelling    History   Social History  . Marital Status: Single    Spouse Name: N/A  . Number of Children: N/A  . Years of Education: N/A   Occupational History  . Not on file.   Social  History Main Topics  . Smoking status: Never Smoker   . Smokeless tobacco: Never Used  . Alcohol Use: No  . Drug Use: No  . Sexual Activity: No   Other Topics Concern  . Not on file   Social History Narrative    Family History  Problem Relation Age of Onset  . Cancer Mother     breast  . Hypertension Mother   . Thyroid disease Mother   . Early death Father   . Cancer Maternal Grandmother     bladder  . Diabetes Maternal Grandmother   . Hypertension Maternal Grandmother   . COPD Maternal Grandfather   . Heart disease Maternal Grandfather     BP 99/68 mmHg  Pulse 77  Ht 5\' 6"  (1.676 m)  Wt 188 lb (85.276 kg)  BMI 30.36 kg/m2  LMP 10/17/2014  Review of Systems: See HPI above.    Objective:  Physical Exam:  Gen: NAD  Neck: No gross deformity, swelling, bruising. TTP left paraspinal cervical region.  No midline/bony TTP. FROM neck - pain on left lateral rotation, flexion and extension. BUE strength 5/5.   Sensation intact to light touch.   2+ equal reflexes in triceps, biceps, brachioradialis tendons. Negative spurlings. NV intact distal BUEs.    Assessment & Plan:  1. Cervical/trapezius strain - ibuprofen with norco as needed.  Shown home exercises to do  daily - will consider physical therapy.  Heat for spasms.  Ergonomic issues discussed.  F/u in 1 month.

## 2014-11-13 NOTE — Telephone Encounter (Signed)
-----   Message from Lanice Shirts, MD sent at 11/13/2014  7:39 AM EDT ----- Call pt and let her know her anemia is slightly better.  Her vitamin B12 is normal and vitamin D is much better.  She can take her vitamin D 5000 units that she has at home once or twice a week  Advise her to be sure to make a follow up appt with her rheumatologist Dr. Amil Amen  Fax labs to his office

## 2015-07-18 DIAGNOSIS — Z3041 Encounter for surveillance of contraceptive pills: Secondary | ICD-10-CM | POA: Diagnosis not present

## 2015-10-20 DIAGNOSIS — L739 Follicular disorder, unspecified: Secondary | ICD-10-CM | POA: Diagnosis not present

## 2015-10-20 DIAGNOSIS — I776 Arteritis, unspecified: Secondary | ICD-10-CM | POA: Diagnosis not present

## 2015-10-20 DIAGNOSIS — L680 Hirsutism: Secondary | ICD-10-CM | POA: Diagnosis not present

## 2015-10-20 MED FILL — CLINDAMYCIN PH 1% SOLUTION: 1 | 30 days supply | Qty: 60 | Fill #0

## 2015-10-20 MED FILL — TRIAMCINOLONE 0.1% CREAM: 0.1 | 30 days supply | Qty: 454 | Fill #0

## 2015-10-20 MED FILL — TACROLIMUS 0.1% OINTMENT: 0.1 | 30 days supply | Qty: 60 | Fill #0

## 2015-10-28 MED FILL — FLUCONAZOLE 150 MG TABLET: 150 | 3 days supply | Qty: 3 | Fill #0

## 2016-02-20 ENCOUNTER — Observation Stay (HOSPITAL_COMMUNITY): Payer: 59 | Admitting: Certified Registered Nurse Anesthetist

## 2016-02-20 ENCOUNTER — Encounter (HOSPITAL_COMMUNITY): Payer: Self-pay

## 2016-02-20 ENCOUNTER — Observation Stay (HOSPITAL_COMMUNITY)
Admission: AD | Admit: 2016-02-20 | Discharge: 2016-02-20 | Disposition: A | Discharge status: Home / Self Care | Payer: 59 | Source: Ambulatory Visit | Attending: Obstetrics and Gynecology | Admitting: Obstetrics and Gynecology

## 2016-02-20 DIAGNOSIS — Z7982 Long term (current) use of aspirin: Secondary | ICD-10-CM | POA: Insufficient documentation

## 2016-02-20 DIAGNOSIS — Z1321 Encounter for screening for nutritional disorder: Secondary | ICD-10-CM | POA: Diagnosis not present

## 2016-02-20 DIAGNOSIS — D500 Iron deficiency anemia secondary to blood loss (chronic): Secondary | ICD-10-CM | POA: Diagnosis present

## 2016-02-20 DIAGNOSIS — R5383 Other fatigue: Secondary | ICD-10-CM | POA: Diagnosis not present

## 2016-02-20 DIAGNOSIS — E611 Iron deficiency: Secondary | ICD-10-CM | POA: Diagnosis not present

## 2016-02-20 DIAGNOSIS — N938 Other specified abnormal uterine and vaginal bleeding: Principal | ICD-10-CM | POA: Insufficient documentation

## 2016-02-20 LAB — PREPARE RBC (CROSSMATCH)

## 2016-02-20 MED ORDER — SODIUM CHLORIDE 0.9 % IV SOLN
Freq: Once | INTRAVENOUS | Status: AC
  Administered 2016-02-20: 14:00:00 via INTRAVENOUS

## 2016-02-20 MED ORDER — DIPHENHYDRAMINE HCL 25 MG PO CAPS
25.0000 mg | ORAL_CAPSULE | Freq: Once | ORAL | Status: AC
  Administered 2016-02-20: 25 mg via ORAL
  Filled 2016-02-20: qty 1

## 2016-02-20 MED ORDER — ACETAMINOPHEN 325 MG PO TABS
650.0000 mg | ORAL_TABLET | Freq: Once | ORAL | Status: AC
  Administered 2016-02-20: 650 mg via ORAL
  Filled 2016-02-20: qty 2

## 2016-02-20 NOTE — Progress Notes (Signed)
History     Cc: low hgb 5.9  40 yo BF here for blood transfusion due to finding of hgb 5..9 on CBC done after pt c/o feeling weak. Pt has been bleeding for weeks despite hormonal therapy. Pt notes light headedness , DOE and head pounding   OB History    Gravida Para Term Preterm AB Living   1 1 1     1    SAB TAB Ectopic Multiple Live Births           1      Past Medical History:  Diagnosis Date  . No pertinent past medical history     Past Surgical History:  Procedure Laterality Date  . NO PAST SURGERIES      Family History  Problem Relation Age of Onset  . Cancer Mother     breast  . Hypertension Mother   . Thyroid disease Mother   . Early death Father   . Cancer Maternal Grandmother     bladder  . Diabetes Maternal Grandmother   . Hypertension Maternal Grandmother   . COPD Maternal Grandfather   . Heart disease Maternal Grandfather     Social History  Substance Use Topics  . Smoking status: Never Smoker  . Smokeless tobacco: Never Used  . Alcohol use No    Allergies:  Allergies  Allergen Reactions  . Clarithromycin Swelling    Lip swelling  . Sudafed [Pseudoephedrine Hcl] Swelling    Lip swelling    Prescriptions Prior to Admission  Medication Sig Dispense Refill Last Dose  . Aspirin-Salicylamide-Caffeine (BC HEADACHE POWDER PO) Take 1 packet by mouth as needed.   Taking  . Cholecalciferol 50000 UNITS TABS Take 1 capsule by mouth once a week. 12 tablet 0 Taking  . Fe Fum-FePoly-Vit C-Vit B3 (INTEGRA) 62.5-62.5-40-3 MG CAPS Take one daily with food  Give generic 90 capsule 1   . HYDROcodone-acetaminophen (NORCO) 5-325 MG per tablet Take 1 tablet by mouth every 6 (six) hours as needed for moderate pain. 40 tablet 0   . ibuprofen (ADVIL,MOTRIN) 600 MG tablet Take 600 mg by mouth every 6 (six) hours as needed for pain.   Taking  . LORazepam (ATIVAN) 1 MG tablet Take 1/2 tablet as needed for anxiety and 1 tab three times a week for sleep 30 tablet 1 Taking   . nystatin-triamcinolone ointment (MYCOLOG) Apply 1 application topically 2 (two) times daily. Apply to rash tid for 2 days then bid 30 g 1 Taking  . Vitamin D, Ergocalciferol, (DRISDOL) 50000 UNITS CAPS capsule Take one capsule once a week for 12 weeks then 2000 units daily 12 capsule 0 Taking     Physical Exam   Blood pressure (!) 108/54, pulse (!) 109, temperature 97.8 F (36.6 C), temperature source Oral, resp. rate 16, height 5\' 6"  (1.676 m), weight 88 kg (194 lb), SpO2 100 %.  General appearance: alert, cooperative and no distress Lungs: clear to auscultation bilaterally Heart: sl tachycardia Abdomen: soft, non-tender; bowel sounds normal; no masses,  no organomegaly Pelvic: deferred Extremities: no edema, redness or tenderness in the calves or thighs Skin: pale ED Course  A/P  Severe Iron deficiency 2nd to DUB P) transfuse 4 unit PRBC then d/c home with f/u office for continued workup as outpatient  MDM  Thoams Siefert A, MD 1:30 PM 02/20/2016

## 2016-02-20 NOTE — Progress Notes (Signed)
Blood transfussion completed. Pt is to being discharged tonightas discussed with Dr Garwin Brothers. Vital signs within normal limits

## 2016-02-22 LAB — TYPE AND SCREEN
ABO/RH(D): O POS
ANTIBODY SCREEN: NEGATIVE
UNIT DIVISION: 0
Unit division: 0
Unit division: 0
Unit division: 0

## 2016-02-23 ENCOUNTER — Emergency Department (INDEPENDENT_AMBULATORY_CARE_PROVIDER_SITE_OTHER): Payer: 59

## 2016-02-23 ENCOUNTER — Encounter: Payer: Self-pay | Admitting: *Deleted

## 2016-02-23 ENCOUNTER — Emergency Department
Admission: EM | Admit: 2016-02-23 | Discharge: 2016-02-23 | Disposition: A | Discharge status: Home / Self Care | Payer: 59 | Source: Home / Self Care | Attending: Family Medicine | Admitting: Family Medicine

## 2016-02-23 DIAGNOSIS — R109 Unspecified abdominal pain: Secondary | ICD-10-CM | POA: Diagnosis not present

## 2016-02-23 DIAGNOSIS — K828 Other specified diseases of gallbladder: Secondary | ICD-10-CM | POA: Diagnosis not present

## 2016-02-23 DIAGNOSIS — R1011 Right upper quadrant pain: Secondary | ICD-10-CM

## 2016-02-23 DIAGNOSIS — R110 Nausea: Secondary | ICD-10-CM

## 2016-02-23 DIAGNOSIS — Z79899 Other long term (current) drug therapy: Secondary | ICD-10-CM | POA: Diagnosis not present

## 2016-02-23 LAB — POCT CBC W AUTO DIFF (K'VILLE URGENT CARE)

## 2016-02-23 LAB — POCT URINALYSIS DIP (MANUAL ENTRY)
Bilirubin, UA: NEGATIVE
Glucose, UA: NEGATIVE
Ketones, POC UA: NEGATIVE
Leukocytes, UA: NEGATIVE
Nitrite, UA: NEGATIVE
Protein Ur, POC: NEGATIVE
Spec Grav, UA: <=1.005
Urobilinogen, UA: 0.2
pH, UA: 6

## 2016-02-23 LAB — POCT URINE PREGNANCY: Preg Test, Ur: NEGATIVE

## 2016-02-23 MED ORDER — PROMETHAZINE HCL 25 MG PO TABS: 25.0000 mg | ORAL_TABLET | Freq: Four times a day (QID) | ORAL | 0 refills | Status: DC | PRN

## 2016-02-23 MED ORDER — HYDROCODONE-ACETAMINOPHEN 5-325 MG PO TABS: 1.0000 | ORAL_TABLET | ORAL | 0 refills | Status: DC | PRN

## 2016-02-23 MED ORDER — KETOROLAC TROMETHAMINE 60 MG/2ML IM SOLN
60.0000 mg | Freq: Once | INTRAMUSCULAR | Status: AC
  Administered 2016-02-23: 60 mg via INTRAMUSCULAR

## 2016-02-23 MED FILL — PROMETHAZINE 25 MG TABLET: 25 | 2 days supply | Qty: 10 | Fill #0

## 2016-02-23 MED FILL — HYDROCODON-APAP 5-325: 5-325 | 2 days supply | Qty: 12 | Fill #0

## 2016-02-23 NOTE — ED Triage Notes (Signed)
Pt c/o right flank pain that radiates to her groin x 2 days.Denies visible hematuria or h/o kidney stones. Pain started the morning after having a blood transfusion.

## 2016-02-23 NOTE — ED Provider Notes (Signed)
CSN: [id number]     Arrival date & time 02/23/16  1326 History   First MD Initiated Contact with Patient 02/23/16 1356     Chief Complaint  Patient presents with  . Flank Pain   (Consider location/radiation/quality/duration/timing/severity/associated sxs/prior Treatment) HPI Nicole Donaldson is a 40 y.o. female presenting to UC with c/o Right flank pain that radiates into her groin over the last 2 days. Associated nausea and mild constipation.  She notes she had a blood transfusion about 1 week ago due to low Hgb from a heavy menstrual cycle. Pain started the day after transfusion. She has never had a transfusion before.  Pain is aching and sharp, worse with certain positions.  She has tried acetaminophen but no medication PTA.  Denies urinary symptoms. Denies hx of kidney stones. Denies fever or chills.    Past Medical History:  Diagnosis Date  . No pertinent past medical history    Past Surgical History:  Procedure Laterality Date  . NO PAST SURGERIES     Family History  Problem Relation Age of Onset  . Cancer Mother     breast  . Hypertension Mother   . Thyroid disease Mother   . Early death Father   . Cancer Maternal Grandmother     bladder  . Diabetes Maternal Grandmother   . Hypertension Maternal Grandmother   . COPD Maternal Grandfather   . Heart disease Maternal Grandfather    Social History  Substance Use Topics  . Smoking status: Never Smoker  . Smokeless tobacco: Never Used  . Alcohol use No   OB History    Gravida Para Term Preterm AB Living   1 1 1     1    SAB TAB Ectopic Multiple Live Births           1     Review of Systems  Constitutional: Negative for chills, fatigue and fever.  Respiratory: Negative for cough, chest tightness and shortness of breath.   Cardiovascular: Negative for chest pain and palpitations.  Gastrointestinal: Positive for abdominal pain (Right side) and constipation. Negative for blood in stool, diarrhea, nausea and vomiting.   Genitourinary: Positive for flank pain (Right). Negative for dysuria, frequency, genital sores, hematuria, menstrual problem, pelvic pain and urgency.  Musculoskeletal: Positive for back pain (Right side, mid back). Negative for myalgias.    Allergies  Clarithromycin and Sudafed [pseudoephedrine hcl]  Home Medications   Prior to Admission medications   Medication Sig Start Date End Date Taking? Authorizing Provider  Aspirin-Salicylamide-Caffeine (BC HEADACHE POWDER PO) Take 1 packet by mouth daily as needed (migraine).     Historical Provider, MD  HYDROcodone-acetaminophen (NORCO/VICODIN) 5-325 MG tablet Take 1 tablet by mouth every 4 (four) hours as needed for moderate pain or severe pain. 02/23/16   Noland Fordyce, PA-C  ibuprofen (ADVIL,MOTRIN) 800 MG tablet Take 800 mg by mouth every 8 (eight) hours as needed for headache.    Historical Provider, MD  promethazine (PHENERGAN) 25 MG tablet Take 1 tablet (25 mg total) by mouth every 6 (six) hours as needed for nausea or vomiting. 02/23/16   Noland Fordyce, PA-C   Meds Ordered and Administered this Visit   Medications  ketorolac (TORADOL) injection 60 mg (60 mg Intramuscular Given 02/23/16 1419)    BP 120/77 (BP Location: Left Arm)   Pulse 99   Temp 98.1 F (36.7 C) (Oral)   Resp 16   Wt 200 lb (90.7 kg)   LMP 02/02/2016   SpO2 100%  BMI 32.28 kg/m  No data found.   Physical Exam  Constitutional: She appears well-developed and well-nourished.  Appears uncomfortable walking to exam room, walking slightly hunched over holding her Right side.  HENT:  Head: Normocephalic and atraumatic.  Mouth/Throat: Uvula is midline, oropharynx is clear and moist and mucous membranes are normal.  Eyes: Conjunctivae are normal. No scleral icterus.  Neck: Normal range of motion. Neck supple.  Cardiovascular: Normal rate, regular rhythm and normal heart sounds.   Pulmonary/Chest: Effort normal and breath sounds normal. No respiratory distress.  She has no wheezes. She has no rales. She exhibits no tenderness.  Abdominal: Soft. Bowel sounds are normal. She exhibits no distension and no mass. There is tenderness. There is no rebound, no guarding and no CVA tenderness.  Soft, non-distended, tenderness to Right side. No focal tenderness. No guarding.   Musculoskeletal: Normal range of motion.  Neurological: She is alert.  Skin: Skin is warm and dry. She is not diaphoretic.  Nursing note and vitals reviewed.   Urgent Care Course   Clinical Course    Procedures (including critical care time)  Labs Review Labs Reviewed  POCT URINALYSIS DIP (MANUAL ENTRY) - Abnormal; Notable for the following:       Result Value   Blood, UA trace-intact (*)    All other components within normal limits  COMPLETE METABOLIC PANEL WITH GFR  POCT CBC W AUTO DIFF ([facility])  POCT URINE PREGNANCY    Imaging Review Ct Renal Stone Study  Result Date: 02/23/2016 CLINICAL DATA:  Right flank pain since receiving blood transfusion on Saturday. EXAM: CT ABDOMEN AND PELVIS WITHOUT CONTRAST TECHNIQUE: Multidetector CT imaging of the abdomen and pelvis was performed following the standard protocol without IV contrast. COMPARISON:  None. FINDINGS: Lower chest: Lung bases are clear. No effusions. Heart is normal size. Hepatobiliary: Subtle high-density material layers in the gallbladder, possibly layering stones or sludge. Small hypodensity in the right hepatic lobe measures 8-9 mm, likely small cysts. Pancreas: No focal abnormality or ductal dilatation. Spleen: No focal abnormality.  Normal size. Adrenals/Urinary Tract: No adrenal abnormality. No focal renal abnormality. No stones or hydronephrosis. Urinary bladder is unremarkable. Stomach/Bowel: Appendix is normal. Stomach, large and small bowel grossly unremarkable. Vascular/Lymphatic: No evidence of aneurysm or adenopathy. Reproductive: Uterus and adnexa unremarkable.  No mass. Other: No free fluid or  free air. Musculoskeletal: No acute bony abnormality or focal bone lesion. IMPRESSION: No renal or ureteral stones.  No hydronephrosis. Subtle layering high-density material within the gallbladder, possibly small layering stones or sludge. Normal appendix. Electronically Signed   By: Rolm Baptise M.D.   On: 02/23/2016 15:10   US Abdomen Limited Ruq  Result Date: 02/23/2016 CLINICAL DATA:  Right upper quadrant pain. EXAM: US ABDOMEN LIMITED - RIGHT UPPER QUADRANT COMPARISON:  CT of the abdomen pelvis 02/23/2016 FINDINGS: Gallbladder: The gallbladder is normally distended. There is no evidence of gallbladder wall thickening. Gallbladder wall measures 2.8 mm. There is echogenic debris layering within the dependent portion of the gallbladder. The sonographic Murphy's sign was reported as negative. Common bile duct: Diameter: 2.3 mm. Liver: No focal lesion identified. Within normal limits in parenchymal echogenicity. IMPRESSION: Gallbladder sludge without evidence of acute cholecystitis. Electronically Signed   By: Fidela Salisbury M.D.   On: 02/23/2016 16:08    MDM   1. Gallbladder sludge   2. Right flank pain   3. Nausea    Pt c/o Right flank pain with associated nausea.  Pt given Toradol  60mg  IM.    UA: trace Hgb, otherwise no evidence of UTI. Doubt pyelonephritis. Discussed CT abd to r/o stone due to severe pain and no prior hx of stones. Low concern for appendicitis or cholecystitis due to pt being afebrile, and no leukocytosis.  CT abd: no renal or ureteral stones. No hydronephrosis. Questionable gallbladder stones vs sludge Pt agreed to U/S gallbladder for further evaluation. U/S: gallbladder sludge w/o evidence of acute cholecystitis.    Rx: phenergan and norco Pt safe for discharge home, pain improved from 7/10 to 3/10 after Tordaol.  F/u with PCP if not improving. Home care instructions with low fat diet for gallbladder provided.  Discussed symptoms that warrant emergent care in the  ED. Patient verbalized understanding and agreement with treatment plan.     Noland Fordyce, PA-C 02/23/16 [phone]

## 2016-02-24 ENCOUNTER — Telehealth: Payer: Self-pay | Admitting: *Deleted

## 2016-02-24 DIAGNOSIS — M549 Dorsalgia, unspecified: Secondary | ICD-10-CM | POA: Diagnosis not present

## 2016-02-24 DIAGNOSIS — N938 Other specified abnormal uterine and vaginal bleeding: Secondary | ICD-10-CM | POA: Diagnosis not present

## 2016-02-24 LAB — COMPLETE METABOLIC PANEL WITH GFR
ALT: 12 U/L (ref 6–29)
AST: 15 U/L (ref 10–30)
Albumin: 3.7 g/dL (ref 3.6–5.1)
Alkaline Phosphatase: 49 U/L (ref 33–115)
BUN: 9 mg/dL (ref 7–25)
CO2: 25 mmol/L (ref 20–31)
Calcium: 8.8 mg/dL (ref 8.6–10.2)
Chloride: 107 mmol/L (ref 98–110)
Creat: 0.95 mg/dL (ref 0.50–1.10)
GFR, Est African American: 87 mL/min (ref >=60)
GFR, Est Non African American: 76 mL/min (ref >=60)
Glucose, Bld: 85 mg/dL (ref 65–99)
Potassium: 4.4 mmol/L (ref 3.5–5.3)
Sodium: 142 mmol/L (ref 135–146)
Total Bilirubin: 0.3 mg/dL (ref 0.2–1.2)
Total Protein: 6.7 g/dL (ref 6.1–8.1)

## 2016-02-24 NOTE — Telephone Encounter (Signed)
Callback: Patient advised of CMP from yesterday. She reports her pain has returned to the severity of yesterday and the hydrocodone is not helping. Per Nicole Donaldson, she can try combining IBF 800mg  every 6-8 hours with the hydrocodone, other wise should proceed to the ER for management. Pt verbalized understanding and per her request faxed over labs, imaging and notes from her visit on 02/23/16

## 2016-03-05 ENCOUNTER — Other Ambulatory Visit: Payer: Self-pay | Admitting: Obstetrics and Gynecology

## 2016-03-11 NOTE — Patient Instructions (Signed)
Your procedure is scheduled on:  Friday, Sept. 15, 2017  Enter through the [employer] [facility] at:  1:30 PM  Pick up the phone at the desk and dial [phone].  Call this number if you have problems the morning of surgery: [phone].  Remember: Do NOT eat food:  After Midnight Thursday  Do NOT drink clear liquids after:  11:00 AM day of surgery  Take these medicines the morning of surgery with a SIP OF WATER:  None  Do NOT wear jewelry (body piercing), metal hair clips/bobby pins, make-up, or nail polish. Do NOT wear lotions, powders, or perfumes.  You may wear deodorant. Do NOT shave for 48 hours prior to surgery. Do NOT bring valuables to the hospital. Contacts, dentures, or bridgework may not be worn into surgery.  Have a responsible adult drive you home and stay with you for 24 hours after your procedure

## 2016-03-12 ENCOUNTER — Encounter (HOSPITAL_COMMUNITY)
Admission: RE | Admit: 2016-03-12 | Discharge: 2016-03-12 | Disposition: A | Discharge status: Home / Self Care | Payer: 59 | Source: Ambulatory Visit | Attending: Obstetrics and Gynecology | Admitting: Obstetrics and Gynecology

## 2016-03-12 ENCOUNTER — Encounter (HOSPITAL_COMMUNITY): Payer: Self-pay

## 2016-03-12 DIAGNOSIS — Z01818 Encounter for other preprocedural examination: Secondary | ICD-10-CM | POA: Insufficient documentation

## 2016-03-12 HISTORY — DX: Headache: R51

## 2016-03-12 HISTORY — DX: Injury of unspecified nerves of neck, initial encounter: S14.9XXA

## 2016-03-12 HISTORY — DX: Anemia, unspecified: D64.9

## 2016-03-12 HISTORY — DX: Nontoxic goiter, unspecified: E04.9

## 2016-03-12 HISTORY — DX: Personal history of other diseases of the digestive system: Z87.19

## 2016-03-12 HISTORY — DX: Headache, unspecified: R51.9

## 2016-03-12 HISTORY — DX: Unspecified asthma, uncomplicated: J45.909

## 2016-03-12 HISTORY — DX: Gestational diabetes mellitus in pregnancy, unspecified control: O24.419

## 2016-03-12 HISTORY — DX: Mononeuropathy, unspecified: G58.9

## 2016-03-12 HISTORY — DX: Personal history of other medical treatment: Z92.89

## 2016-03-12 LAB — CBC
HEMATOCRIT: 35.9 % — AB (ref 36.0–46.0)
Hemoglobin: 11.5 g/dL — ABNORMAL LOW (ref 12.0–15.0)
MCH: 26.1 pg (ref 26.0–34.0)
MCHC: 32 g/dL (ref 30.0–36.0)
MCV: 81.6 fL (ref 78.0–100.0)
Platelets: 454 10*3/uL — ABNORMAL HIGH (ref 150–400)
RBC: 4.4 MIL/uL (ref 3.87–5.11)
RDW: 16.7 % — AB (ref 11.5–15.5)
WBC: 9.6 10*3/uL (ref 4.0–10.5)

## 2016-03-19 ENCOUNTER — Encounter (HOSPITAL_COMMUNITY): Payer: Self-pay

## 2016-03-19 ENCOUNTER — Ambulatory Visit (HOSPITAL_COMMUNITY): Payer: 59 | Admitting: Anesthesiology

## 2016-03-19 ENCOUNTER — Encounter (HOSPITAL_COMMUNITY)
Admission: RE | Disposition: A | Discharge status: Home / Self Care | Payer: Self-pay | Source: Ambulatory Visit | Attending: Obstetrics and Gynecology

## 2016-03-19 ENCOUNTER — Ambulatory Visit (HOSPITAL_COMMUNITY)
Admission: RE | Admit: 2016-03-19 | Discharge: 2016-03-19 | Disposition: A | Discharge status: Home / Self Care | Payer: 59 | Source: Ambulatory Visit | Attending: Obstetrics and Gynecology | Admitting: Obstetrics and Gynecology

## 2016-03-19 DIAGNOSIS — N938 Other specified abnormal uterine and vaginal bleeding: Secondary | ICD-10-CM | POA: Diagnosis not present

## 2016-03-19 DIAGNOSIS — R938 Abnormal findings on diagnostic imaging of other specified body structures: Secondary | ICD-10-CM | POA: Diagnosis not present

## 2016-03-19 DIAGNOSIS — D509 Iron deficiency anemia, unspecified: Secondary | ICD-10-CM | POA: Diagnosis not present

## 2016-03-19 DIAGNOSIS — N840 Polyp of corpus uteri: Secondary | ICD-10-CM | POA: Diagnosis not present

## 2016-03-19 HISTORY — PX: DILATATION & CURETTAGE/HYSTEROSCOPY WITH MYOSURE: SHX6511

## 2016-03-19 SURGERY — DILATATION & CURETTAGE/HYSTEROSCOPY WITH MYOSURE
Anesthesia: General | Site: Vagina

## 2016-03-19 MED ORDER — FENTANYL CITRATE (PF) 100 MCG/2ML IJ SOLN
INTRAMUSCULAR | Status: DC | PRN
  Administered 2016-03-19 (×2): 50 ug via INTRAVENOUS

## 2016-03-19 MED ORDER — MEPERIDINE HCL 25 MG/ML IJ SOLN: 6.2500 mg | INTRAMUSCULAR | Status: DC | PRN

## 2016-03-19 MED ORDER — SODIUM CHLORIDE 0.9 % IR SOLN
Status: DC | PRN
  Administered 2016-03-19: 3000 mL

## 2016-03-19 MED ORDER — OXYCODONE HCL 5 MG/5ML PO SOLN: 5.0000 mg | Freq: Once | ORAL | Status: DC | PRN

## 2016-03-19 MED ORDER — LIDOCAINE HCL (CARDIAC) 20 MG/ML IV SOLN
INTRAVENOUS | Status: AC
  Filled 2016-03-19: qty 5

## 2016-03-19 MED ORDER — ONDANSETRON HCL 4 MG/2ML IJ SOLN
INTRAMUSCULAR | Status: DC | PRN
  Administered 2016-03-19: 4 mg via INTRAVENOUS

## 2016-03-19 MED ORDER — FENTANYL CITRATE (PF) 100 MCG/2ML IJ SOLN
INTRAMUSCULAR | Status: AC
  Filled 2016-03-19: qty 2

## 2016-03-19 MED ORDER — KETOROLAC TROMETHAMINE 30 MG/ML IJ SOLN
INTRAMUSCULAR | Status: AC
  Filled 2016-03-19: qty 1

## 2016-03-19 MED ORDER — MIDAZOLAM HCL 5 MG/5ML IJ SOLN
INTRAMUSCULAR | Status: DC | PRN
  Administered 2016-03-19: 2 mg via INTRAVENOUS

## 2016-03-19 MED ORDER — FENTANYL CITRATE (PF) 100 MCG/2ML IJ SOLN: 25.0000 ug | INTRAMUSCULAR | Status: DC | PRN

## 2016-03-19 MED ORDER — IBUPROFEN 800 MG PO TABS: 800.0000 mg | ORAL_TABLET | Freq: Three times a day (TID) | ORAL | 1 refills | Status: DC | PRN

## 2016-03-19 MED ORDER — ONDANSETRON HCL 4 MG/2ML IJ SOLN: 4.0000 mg | Freq: Once | INTRAMUSCULAR | Status: DC | PRN

## 2016-03-19 MED ORDER — LIDOCAINE HCL (CARDIAC) 20 MG/ML IV SOLN
INTRAVENOUS | Status: DC | PRN
  Administered 2016-03-19: 100 mg via INTRAVENOUS

## 2016-03-19 MED ORDER — SCOPOLAMINE 1 MG/3DAYS TD PT72
1.0000 | MEDICATED_PATCH | Freq: Once | TRANSDERMAL | Status: DC
  Administered 2016-03-19: 1.5 mg via TRANSDERMAL

## 2016-03-19 MED ORDER — PROPOFOL 10 MG/ML IV BOLUS
INTRAVENOUS | Status: DC | PRN
  Administered 2016-03-19: 200 mg via INTRAVENOUS

## 2016-03-19 MED ORDER — ACETAMINOPHEN 160 MG/5ML PO SOLN: 325.0000 mg | ORAL | Status: DC | PRN

## 2016-03-19 MED ORDER — DEXAMETHASONE SODIUM PHOSPHATE 4 MG/ML IJ SOLN
INTRAMUSCULAR | Status: DC | PRN
  Administered 2016-03-19: 10 mg via INTRAVENOUS

## 2016-03-19 MED ORDER — ACETAMINOPHEN 325 MG PO TABS: 325.0000 mg | ORAL_TABLET | ORAL | Status: DC | PRN

## 2016-03-19 MED ORDER — KETOROLAC TROMETHAMINE 30 MG/ML IJ SOLN: 30.0000 mg | Freq: Once | INTRAMUSCULAR | Status: DC

## 2016-03-19 MED ORDER — ONDANSETRON HCL 4 MG/2ML IJ SOLN
INTRAMUSCULAR | Status: AC
  Filled 2016-03-19: qty 2

## 2016-03-19 MED ORDER — LACTATED RINGERS IV SOLN
INTRAVENOUS | Status: DC
  Administered 2016-03-19: 125 mL/h via INTRAVENOUS

## 2016-03-19 MED ORDER — MIDAZOLAM HCL 2 MG/2ML IJ SOLN
INTRAMUSCULAR | Status: AC
  Filled 2016-03-19: qty 2

## 2016-03-19 MED ORDER — OXYCODONE HCL 5 MG PO TABS: 5.0000 mg | ORAL_TABLET | Freq: Once | ORAL | Status: DC | PRN

## 2016-03-19 MED ORDER — PROPOFOL 10 MG/ML IV BOLUS
INTRAVENOUS | Status: AC
  Filled 2016-03-19: qty 20

## 2016-03-19 MED ORDER — KETOROLAC TROMETHAMINE 30 MG/ML IJ SOLN
INTRAMUSCULAR | Status: DC | PRN
  Administered 2016-03-19: 30 mg via INTRAVENOUS

## 2016-03-19 MED ORDER — KETOROLAC TROMETHAMINE 60 MG/2ML IM SOLN
INTRAMUSCULAR | Status: DC | PRN
  Administered 2016-03-19: 30 mg via INTRAMUSCULAR

## 2016-03-19 MED ORDER — DEXAMETHASONE SODIUM PHOSPHATE 10 MG/ML IJ SOLN
INTRAMUSCULAR | Status: AC
  Filled 2016-03-19: qty 2

## 2016-03-19 MED ORDER — SCOPOLAMINE 1 MG/3DAYS TD PT72
MEDICATED_PATCH | TRANSDERMAL | Status: AC
  Administered 2016-03-19: 1.5 mg via TRANSDERMAL
  Filled 2016-03-19: qty 1

## 2016-03-19 SURGICAL SUPPLY — 20 items
CANISTER SUCT 3000ML (MISCELLANEOUS) ×3 IMPLANT
CATH ROBINSON RED A/P 16FR (CATHETERS) ×3 IMPLANT
CLOTH BEACON ORANGE TIMEOUT ST (SAFETY) ×3 IMPLANT
CONTAINER PREFILL 10% NBF 60ML (FORM) ×3 IMPLANT
DEVICE MYOSURE LITE (MISCELLANEOUS) ×2 IMPLANT
DEVICE MYOSURE REACH (MISCELLANEOUS) IMPLANT
ELECT REM PT RETURN 9FT ADLT (ELECTROSURGICAL) ×3
ELECTRODE REM PT RTRN 9FT ADLT (ELECTROSURGICAL) ×1 IMPLANT
FILTER ARTHROSCOPY CONVERTOR (FILTER) ×3 IMPLANT
GLOVE BIOGEL PI IND STRL 7.0 (GLOVE) ×2 IMPLANT
GLOVE BIOGEL PI INDICATOR 7.0 (GLOVE) ×4
GLOVE ECLIPSE 6.5 STRL STRAW (GLOVE) ×3 IMPLANT
GOWN STRL REUS W/TWL LRG LVL3 (GOWN DISPOSABLE) ×6 IMPLANT
PACK VAGINAL MINOR WOMEN LF (CUSTOM PROCEDURE TRAY) ×3 IMPLANT
PAD OB MATERNITY 4.3X12.25 (PERSONAL CARE ITEMS) ×3 IMPLANT
SEAL ROD LENS SCOPE MYOSURE (ABLATOR) ×3 IMPLANT
TOWEL OR 17X24 6PK STRL BLUE (TOWEL DISPOSABLE) ×6 IMPLANT
TUBING AQUILEX INFLOW (TUBING) ×3 IMPLANT
TUBING AQUILEX OUTFLOW (TUBING) ×3 IMPLANT
WATER STERILE IRR 1000ML POUR (IV SOLUTION) ×3 IMPLANT

## 2016-03-19 NOTE — H&P (Signed)
Nicole Donaldson is an 40 y.o. female.P1 BF presents for dx hysteroscopy, D&C due to DUB requiring blood transfusion and IV iron. Sono showed thickened endometrium  Pertinent Gynecological History: Menses: flow is excessive with use of 5 pads or tampons on heaviest days Bleeding: dysfunctional uterine bleeding Contraception: none DES exposure: denies Blood transfusions: yes Sexually transmitted diseases: no past history Previous GYN Procedures: none  Last mammogram: n/a Date: n/a Last pap: normal Date: 2016 OB History: G1, P 1  Menstrual History: Menarche age: n/a Patient's last menstrual period was 02/26/2016 (approximate).    Past Medical History:  Diagnosis Date  . Anemia   . Asthma    childhood exercise induced  . Enlarged thyroid    during pregnancy  . Gestational diabetes    diet controlled  . Headache    Migraines  . History of blood transfusion   . History of hiatal hernia   . No pertinent past medical history   . Pinched nerve in neck     Past Surgical History:  Procedure Laterality Date  . COLONOSCOPY    . NO PAST SURGERIES    . UPPER GI ENDOSCOPY      Family History  Problem Relation Age of Onset  . Cancer Mother     breast  . Hypertension Mother   . Thyroid disease Mother   . Early death Father   . Cancer Maternal Grandmother     bladder  . Diabetes Maternal Grandmother   . Hypertension Maternal Grandmother   . COPD Maternal Grandfather   . Heart disease Maternal Grandfather     Social History:  reports that she has never smoked. She has never used smokeless tobacco. She reports that she does not drink alcohol or use drugs.  Allergies:  Allergies  Allergen Reactions  . Clarithromycin Swelling    Lip swelling  . Sudafed [Pseudoephedrine Hcl] Swelling    Lip swelling    No prescriptions prior to admission.    Review of Systems  All other systems reviewed and are negative.   Height 5\' 6"  (1.676 m), weight 90.7 kg (200 lb), last  menstrual period 02/26/2016. Physical Exam  Constitutional: She is oriented to person, place, and time. She appears well-developed and well-nourished.  HENT:  Head: Atraumatic.  Eyes: EOM are normal.  Neck: Neck supple.  Cardiovascular: Regular rhythm.   Respiratory: Breath sounds normal.  GI: Soft.  Genitourinary: Vagina normal and uterus normal.  Neurological: She is alert and oriented to person, place, and time.  Skin: Skin is warm and dry.  adnexa nonpalp Cervix parous  CBC Latest Ref Rng & Units 03/12/2016 11/12/2014 08/15/2014  WBC 4.0 - 10.5 K/uL 9.6 5.6 6.3  Hemoglobin 12.0 - 15.0 g/dL 11.5(L) 11.0(L) 10.8(L)  Hematocrit 36.0 - 46.0 % 35.9(L) 35.0(L) 34.4(L)  Platelets 150 - 400 K/uL 454(H) 368 363    No results found.  Assessment/Plan: DUB P) dx hysteroscopy, D&C, Procedure explained. Risk of surgery reviewed including infection, bleeding, injury to surrounding organ structure, uterine perforation and its risk, fluid overload , thermal injury Christean Silvestri A 03/19/2016, 7:18 AM

## 2016-03-19 NOTE — Discharge Instructions (Signed)
CALL  IF TEMP>100.4, NOTHING PER VAGINA X 1 WK, CALL IF SOAKING A MAXI  PAD EVERY HOUR OR MORE FREQUENTLY DISCHARGE INSTRUCTIONS: D&C / D&E The following instructions have been prepared to help you care for yourself upon your return home.   Personal hygiene:  Use sanitary pads for vaginal drainage, not tampons.  Shower the day after your procedure.  NO tub baths, pools or Jacuzzis for 2-3 weeks.  Wipe front to back after using the bathroom.  Activity and limitations:  Do NOT drive or operate any equipment for 24 hours. The effects of anesthesia are still present and drowsiness may result.  Do NOT rest in bed all day.  Walking is encouraged.  Walk up and down stairs slowly.  You may resume your normal activity in one to two days or as indicated by your physician.  Sexual activity: NO intercourse for at least 2 weeks after the procedure, or as indicated by your physician.  Diet: Eat a light meal as desired this evening. You may resume your usual diet tomorrow.  Return to work: You may resume your work activities in one to two days or as indicated by your doctor.  What to expect after your surgery: Expect to have vaginal bleeding/discharge for 2-3 days and spotting for up to 10 days. It is not unusual to have soreness for up to 1-2 weeks. You may have a slight burning sensation when you urinate for the first day. Mild cramps may continue for a couple of days. You may have a regular period in 2-6 weeks.  Call your doctor for any of the following:  Excessive vaginal bleeding, saturating and changing one pad every hour.  Inability to urinate 6 hours after discharge from hospital.  Pain not relieved by pain medication.  Fever of 100.4 F or greater.  Unusual vaginal discharge or odor.   Call for an appointment:      Post Anesthesia Home Care Instructions  Activity: Get plenty of rest for the remainder of the day. A responsible adult should stay with you for 24 hours following the  procedure.  For the next 24 hours, DO NOT: -Drive a car -Operate machinery -Drink alcoholic beverages -Take any medication unless instructed by your physician -Make any legal decisions or sign important papers.  Meals: Start with liquid foods such as gelatin or soup. Progress to regular foods as tolerated. Avoid greasy, spicy, heavy foods. If nausea and/or vomiting occur, drink only clear liquids until the nausea and/or vomiting subsides. Call your physician if vomiting continues.  Special Instructions/Symptoms: Your throat may feel dry or sore from the anesthesia or the breathing tube placed in your throat during surgery. If this causes discomfort, gargle with warm salt water. The discomfort should disappear within 24 hours.  If you had a scopolamine patch placed behind your ear for the management of post- operative nausea and/or vomiting:  1. The medication in the patch is effective for 72 hours, after which it should be removed.  Wrap patch in a tissue and discard in the trash. Wash hands thoroughly with soap and water. 2. You may remove the patch earlier than 72 hours if you experience unpleasant side effects which may include dry mouth, dizziness or visual disturbances. 3. Avoid touching the patch. Wash your hands with soap and water after contact with the patch.    

## 2016-03-19 NOTE — Brief Op Note (Signed)
03/19/2016  2:36 PM  PATIENT:  Nicole Donaldson  40 y.o. female  PRE-OPERATIVE DIAGNOSIS:  Dysfunctional Uterine Bleeding, Iron deficiency anemia  POST-OPERATIVE DIAGNOSIS:  Dysfunctional Uterine Bleeding, IronDeficiency Anemia,  PROCEDURE:  Diagnostic hysteroscopy, hysteroscopic resection  Using myosure, dilation and curettage  SURGEON:  Surgeon(s) and Role:    * Servando Salina, MD - Primary  PHYSICIAN ASSISTANT:   ASSISTANTS: none   ANESTHESIA:   general  EBL:  Total I/O In: 700 [I.V.:700] Out: -   BLOOD ADMINISTERED:none  DRAINS: none   LOCAL MEDICATIONS USED:  NONE  SPECIMEN:  Source of Specimen:  [place] with ? polyp  DISPOSITION OF SPECIMEN:  PATHOLOGY  COUNTS:  YES  TOURNIQUET:  * No tourniquets in log *  DICTATION: .Other Dictation: Dictation Number [id number]  PLAN OF CARE: Discharge to home after PACU  PATIENT DISPOSITION:  PACU - hemodynamically stable.   Delay start of Pharmacological VTE agent (>24hrs) due to surgical blood loss or risk of bleeding: no

## 2016-03-19 NOTE — Transfer of Care (Signed)
Immediate Anesthesia Transfer of Care Note  Patient: Nicole Donaldson  Procedure(s) Performed: Procedure(s): DILATATION & CURETTAGE/HYSTEROSCOPY WITH MYOSURE (N/A)  Patient Location: PACU  Anesthesia Type:General  Level of Consciousness: awake  Airway & Oxygen Therapy: Patient Spontanous Breathing and Patient connected to nasal cannula oxygen  Post-op Assessment: Report given to RN and Post -op Vital signs reviewed and stable  Post vital signs: stable  Last Vitals:  Vitals:   03/19/16 1240 03/19/16 1400  BP: (!) 126/95 128/80  Pulse: 98 (!) 59  Resp: 16 16  Temp: 36.7 C 36.3 C    Last Pain:  Vitals:   03/19/16 1400  PainSc: 3       Patients Stated Pain Goal: 3 ([id number])  Complications: No apparent anesthesia complications

## 2016-03-19 NOTE — Anesthesia Preprocedure Evaluation (Signed)
Anesthesia Evaluation  Patient identified by MRN, date of birth, ID band Patient awake    Reviewed: Allergy & Precautions, H&P , Patient's Chart, lab work & pertinent test results, reviewed documented beta blocker date and time   Airway Mallampati: I  TM Distance: >3 FB Neck ROM: full    Dental no notable dental hx. (+) Teeth Intact   Pulmonary neg pulmonary ROS,    Pulmonary exam normal        Cardiovascular negative cardio ROS Normal cardiovascular exam     Neuro/Psych    GI/Hepatic Neg liver ROS,   Endo/Other  negative endocrine ROSdiabetes, Gestational  Renal/GU negative Renal ROS     Musculoskeletal   Abdominal (+) + obese,   Peds  Hematology   Anesthesia Other Findings   Reproductive/Obstetrics negative OB ROS                             Anesthesia Physical Anesthesia Plan  ASA: II  Anesthesia Plan: General   Post-op Pain Management:    Induction: Intravenous  Airway Management Planned: LMA  Additional Equipment:   Intra-op Plan:   Post-operative Plan:   Informed Consent: I have reviewed the patients History and Physical, chart, labs and discussed the procedure including the risks, benefits and alternatives for the proposed anesthesia with the patient or authorized representative who has indicated his/her understanding and acceptance.     Plan Discussed with: CRNA and Surgeon  Anesthesia Plan Comments:         Anesthesia Quick Evaluation

## 2016-03-19 NOTE — Anesthesia Procedure Notes (Signed)
Procedure Name: LMA Insertion Date/Time: 03/19/2016 2:34 PM Performed by: Riki Sheer Pre-anesthesia Checklist: Patient identified, Emergency Drugs available, Suction available, Patient being monitored and Timeout performed Patient Re-evaluated:Patient Re-evaluated prior to inductionOxygen Delivery Method: Circle system utilized Preoxygenation: Pre-oxygenation with 100% oxygen Intubation Type: IV induction Ventilation: Mask ventilation without difficulty LMA: LMA inserted LMA Size: 4.0 Number of attempts: 1 Placement Confirmation: positive ETCO2,  CO2 detector and breath sounds checked- equal and bilateral Tube secured with: Tape Dental Injury: Teeth and Oropharynx as per pre-operative assessment

## 2016-03-20 NOTE — Op Note (Signed)
NAME:  Nicole Donaldson, PARISE NO.:  [id number]  MEDICAL RECORD NO.:  [id number]  LOCATION:  [facility]                          FACILITY:  [facility]  PHYSICIAN:  Servando Salina, M.D.DATE OF BIRTH:  1976-06-21  DATE OF PROCEDURE:  03/19/2016 DATE OF DISCHARGE:  03/19/2016                              OPERATIVE REPORT   PREOPERATIVE DIAGNOSES:  Dysfunctional uterine bleeding, endometrial thickening on ultrasound, iron deficiency anemia.  PROCEDURES:  Diagnostic hysteroscopy, hysteroscopic resection of endometrial polyp  And thickening using MyoSure, dilation and curettage.  PREOPERATIVE DIAGNOSES:  Dysfunctional uterine bleeding, endometrial thickening on ultrasound, iron deficiency anemia.  ANESTHESIA:  General.  SURGEON:  Servando Salina, MD.  ASSISTANT:  None.  DESCRIPTION OF PROCEDURE:  Under adequate general anesthesia, the patient was placed in the dorsal lithotomy position.  She was sterilely prepped and draped in usual fashion.  The bladder was catheterized with mild amount of urine.  Examination under anesthesia revealed an anteflexed uterus.  No adnexal masses could be appreciated.  A bivalve speculum was placed in vagina.  A single-tooth tenaculum was placed on anterior lip of the cervix.  The cervix was then serially dilated up to #23 [facility] dilator.  A MyoSure hysteroscope was introduced into the uterine cavity.  On the anterior wall in particular on the left, was thickened polypoid-like lesions.  Tubal ostia could be seen.  No other lesions were noted.  Using the MyoSure LITE apparatus, those areas were resected.  Endocervical canal was without any lesions.  The resectoscope was then removed.  The cavity was then gently curetted for additional tissue.  All instruments were then subsequently removed from the vagina.  SPECIMEN:  Labeled endometrial curetting with possible polyp, was sent to Pathology.  ESTIMATED BLOOD LOSS:  10 mL.  COMPLICATION:   None.  The patient tolerated the procedure well, was transferred to recovery in stable condition.     Servando Salina, M.D.     [place]/MEDQ  D:  03/19/2016  T:  03/20/2016  Job:  [id number]

## 2016-03-20 NOTE — Anesthesia Postprocedure Evaluation (Signed)
Anesthesia Post Note  Patient: Nicole Donaldson  Procedure(s) Performed: Procedure(s) (LRB): DILATATION & CURETTAGE/HYSTEROSCOPY WITH MYOSURE (N/A)  Patient location during evaluation: PACU Anesthesia Type: General Level of consciousness: awake Pain management: pain level controlled Vital Signs Assessment: post-procedure vital signs reviewed and stable Respiratory status: spontaneous breathing Cardiovascular status: stable Postop Assessment: no signs of nausea or vomiting Anesthetic complications: no     Last Vitals:  Vitals:   03/19/16 1630 03/19/16 1645  BP: 115/85 102/90  Pulse: 84 88  Resp: (!) 24 20  Temp:  36.7 C    Last Pain:  Vitals:   03/19/16 1645  PainSc: 0-No pain   Pain Goal: Patients Stated Pain Goal: 3 (03/19/16 1502)               [name],JOHN Mate[facility]

## 2016-03-21 ENCOUNTER — Encounter (HOSPITAL_COMMUNITY): Payer: Self-pay | Admitting: Obstetrics and Gynecology

## 2016-04-16 MED FILL — VIT D2 1.25 MG (50,000 UNIT: 1.25 MG | 28 days supply | Qty: 4 | Fill #0

## 2016-05-03 DIAGNOSIS — Z113 Encounter for screening for infections with a predominantly sexual mode of transmission: Secondary | ICD-10-CM | POA: Diagnosis not present

## 2016-05-03 DIAGNOSIS — Z01419 Encounter for gynecological examination (general) (routine) without abnormal findings: Secondary | ICD-10-CM | POA: Diagnosis not present

## 2016-05-03 DIAGNOSIS — Z114 Encounter for screening for human immunodeficiency virus [HIV]: Secondary | ICD-10-CM | POA: Diagnosis not present

## 2016-05-03 DIAGNOSIS — D509 Iron deficiency anemia, unspecified: Secondary | ICD-10-CM | POA: Diagnosis not present

## 2016-05-03 DIAGNOSIS — Z6832 Body mass index (BMI) 32.0-32.9, adult: Secondary | ICD-10-CM | POA: Diagnosis not present

## 2016-05-03 DIAGNOSIS — Z1159 Encounter for screening for other viral diseases: Secondary | ICD-10-CM | POA: Diagnosis not present

## 2016-05-03 DIAGNOSIS — Z1151 Encounter for screening for human papillomavirus (HPV): Secondary | ICD-10-CM | POA: Diagnosis not present

## 2016-06-09 MED FILL — IBUPROFEN 800 MG TABLET: 800 | 10 days supply | Qty: 30 | Fill #0

## 2016-06-09 MED FILL — VIT D2 1.25 MG (50,000 UNIT: 1.25 MG | 28 days supply | Qty: 4 | Fill #1

## 2016-07-22 MED FILL — VIT D2 1.25 MG (50,000 UNIT: 1.25 MG | 28 days supply | Qty: 4 | Fill #2

## 2016-09-30 MED FILL — AMOXICILLIN 500 MG CAPSULE: 500 | 5 days supply | Qty: 15 | Fill #0

## 2016-10-13 MED FILL — LORazepam 2 MG TABS: 2 | 1 days supply | Qty: 1 | Fill #0

## 2016-10-14 MED FILL — HYDROCODON-APAP 5-325: 5-325 | 2 days supply | Qty: 12 | Fill #0

## 2016-10-14 MED FILL — ONDANSETRON ODT 4 MG TABLET: 4 | 2 days supply | Qty: 6 | Fill #0

## 2016-11-05 DIAGNOSIS — D509 Iron deficiency anemia, unspecified: Secondary | ICD-10-CM | POA: Diagnosis not present

## 2016-11-05 DIAGNOSIS — Z1321 Encounter for screening for nutritional disorder: Secondary | ICD-10-CM | POA: Diagnosis not present

## 2016-11-05 DIAGNOSIS — E559 Vitamin D deficiency, unspecified: Secondary | ICD-10-CM | POA: Diagnosis not present

## 2016-11-16 MED FILL — FERRALET 90 DUAL-IRON TAB: 90-1 | 30 days supply | Qty: 30 | Fill #0

## 2016-12-06 DIAGNOSIS — L680 Hirsutism: Secondary | ICD-10-CM | POA: Diagnosis not present

## 2016-12-06 DIAGNOSIS — L739 Follicular disorder, unspecified: Secondary | ICD-10-CM | POA: Diagnosis not present

## 2016-12-06 DIAGNOSIS — L308 Other specified dermatitis: Secondary | ICD-10-CM | POA: Diagnosis not present

## 2016-12-06 DIAGNOSIS — L709 Acne, unspecified: Secondary | ICD-10-CM | POA: Diagnosis not present

## 2016-12-06 DIAGNOSIS — L810 Postinflammatory hyperpigmentation: Secondary | ICD-10-CM | POA: Diagnosis not present

## 2016-12-06 DIAGNOSIS — L089 Local infection of the skin and subcutaneous tissue, unspecified: Secondary | ICD-10-CM | POA: Diagnosis not present

## 2016-12-06 DIAGNOSIS — I776 Arteritis, unspecified: Secondary | ICD-10-CM | POA: Diagnosis not present

## 2016-12-06 DIAGNOSIS — R21 Rash and other nonspecific skin eruption: Secondary | ICD-10-CM | POA: Diagnosis not present

## 2016-12-06 DIAGNOSIS — L708 Other acne: Secondary | ICD-10-CM | POA: Diagnosis not present

## 2016-12-06 MED FILL — TRIAMCINOLONE ACETONIDE 0.1: 0.1 | 30 days supply | Qty: 454 | Fill #0

## 2016-12-06 MED FILL — SPIRONOLACTONE 50 MG TABLET: 50 | 90 days supply | Qty: 90 | Fill #0

## 2016-12-20 DIAGNOSIS — I776 Arteritis, unspecified: Secondary | ICD-10-CM | POA: Diagnosis not present

## 2016-12-20 DIAGNOSIS — I8310 Varicose veins of unspecified lower extremity with inflammation: Secondary | ICD-10-CM | POA: Diagnosis not present

## 2017-01-04 MED FILL — TRETINOIN 0.025% CREAM: 0.025 | 30 days supply | Qty: 45 | Fill #0

## 2017-01-24 MED FILL — TINIDAZOLE 500 MG TABLET: 500 | 2 days supply | Qty: 8 | Fill #0

## 2017-01-26 MED FILL — PENTOXIFYLLINE 400 MG TAB S: 400 | 30 days supply | Qty: 90 | Fill #0

## 2017-06-30 ENCOUNTER — Telehealth: Payer: 59 | Admitting: Family

## 2017-06-30 DIAGNOSIS — N760 Acute vaginitis: Secondary | ICD-10-CM | POA: Diagnosis not present

## 2017-06-30 DIAGNOSIS — B9689 Other specified bacterial agents as the cause of diseases classified elsewhere: Secondary | ICD-10-CM

## 2017-06-30 DIAGNOSIS — B373 Candidiasis of vulva and vagina: Secondary | ICD-10-CM | POA: Diagnosis not present

## 2017-06-30 DIAGNOSIS — B3731 Acute candidiasis of vulva and vagina: Secondary | ICD-10-CM

## 2017-06-30 MED ORDER — METRONIDAZOLE 500 MG PO TABS: 500.0000 mg | ORAL_TABLET | Freq: Two times a day (BID) | ORAL | 0 refills | Status: DC

## 2017-06-30 MED ORDER — FLUCONAZOLE 150 MG PO TABS: 150.0000 mg | ORAL_TABLET | Freq: Once | ORAL | 0 refills | Status: AC

## 2017-06-30 NOTE — Progress Notes (Signed)
Thank you for the details you included in the comment boxes. Those details are very helpful in determining the best course of treatment for you and help [place] to provide the best care. This is most consistent with BV as mentioned below. However, I will give Diflucan treatment for a yeast infection that some people develop after taking antibiotics for BV. At this point, without the itching and without discharge in pieces (similar to cottage cheese), it is likely you don't have a yeast infection. Just keep that script in case it occurs as a result of this treatment. As far as the UTI, it is unlikely based on what you're sharing at this point. As far as the Metro-gel, it is better to start with the oral medication and catch it early and hope we don't need both. That much medication at one time can cause numerous other issues. I am optimistic that we can get this cleared up quickly with the treatment below.  We are sorry that you are not feeling well. Here is how we plan to help! Based on what you shared with me it looks like you: May have a vaginosis due to bacteria  Vaginosis is an inflammation of the vagina that can result in discharge, itching and pain. The cause is usually a change in the normal balance of vaginal bacteria or an infection. Vaginosis can also result from reduced estrogen levels after menopause.  The most common causes of vaginosis are:   Bacterial vaginosis which results from an overgrowth of one on several organisms that are normally present in your vagina.   Yeast infections which are caused by a naturally occurring fungus called candida.   Vaginal atrophy (atrophic vaginosis) which results from the thinning of the vagina from reduced estrogen levels after menopause.   Trichomoniasis which is caused by a parasite and is commonly transmitted by sexual intercourse.  Factors that increase your risk of developing vaginosis include: Marland Kitchen Medications, such as antibiotics and  steroids . Uncontrolled diabetes . Use of hygiene products such as bubble bath, vaginal spray or vaginal deodorant . Douching . Wearing damp or tight-fitting clothing . Using an intrauterine device (IUD) for birth control . Hormonal changes, such as those associated with pregnancy, birth control pills or menopause . Sexual activity . Having a sexually transmitted infection  Your treatment plan is Metronidazole or Flagyl 500mg  twice a day for 7 days.  I have electronically sent this prescription into the pharmacy that you have chosen. Along with Diflucan 150mg  (take once at end of the 7 day antibiotic cycle if you have yeast infection symptoms)  Be sure to take all of the medication as directed. Stop taking any medication if you develop a rash, tongue swelling or shortness of breath. Mothers who are breast feeding should consider pumping and discarding their breast milk while on these antibiotics. However, there is no consensus that infant exposure at these doses would be harmful.  Remember that medication creams can weaken latex condoms. Marland Kitchen   HOME CARE:  Good hygiene may prevent some types of vaginosis from recurring and may relieve some symptoms:  . Avoid baths, hot tubs and whirlpool spas. Rinse soap from your outer genital area after a shower, and dry the area well to prevent irritation. Don't use scented or harsh soaps, such as those with deodorant or antibacterial action. Marland Kitchen Avoid irritants. These include scented tampons and pads. . Wipe from front to back after using the toilet. Doing so avoids spreading fecal bacteria to your  vagina.  Other things that may help prevent vaginosis include:  Marland Kitchen Don't douche. Your vagina doesn't require cleansing other than normal bathing. Repetitive douching disrupts the normal organisms that reside in the vagina and can actually increase your risk of vaginal infection. Douching won't clear up a vaginal infection. . Use a latex condom. Both female and  female latex condoms may help you avoid infections spread by sexual contact. . Wear cotton underwear. Also wear pantyhose with a cotton crotch. If you feel comfortable without it, skip wearing underwear to bed. Yeast thrives in [employer] Your symptoms should improve in the next day or two.  GET HELP RIGHT AWAY IF:  . You have pain in your lower abdomen ( pelvic area or over your ovaries) . You develop nausea or vomiting . You develop a fever . Your discharge changes or worsens . You have persistent pain with intercourse . You develop shortness of breath, a rapid pulse, or you faint.  These symptoms could be signs of problems or infections that need to be evaluated by a medical provider now.  MAKE SURE YOU    Understand these instructions.  Will watch your condition.  Will get help right away if you are not doing well or get worse.  Your e-visit answers were reviewed by a board certified advanced clinical practitioner to complete your personal care plan. Depending upon the condition, your plan could have included both over the counter or prescription medications. Please review your pharmacy choice to make sure that you have choses a pharmacy that is open for you to pick up any needed prescription, Your safety is important to [place]. If you have drug allergies check your prescription carefully.   You can use MyChart to ask questions about today's visit, request a non-urgent call back, or ask for a work or school excuse for 24 hours related to this e-Visit. If it has been greater than 24 hours you will need to follow up with your provider, or enter a new e-Visit to address those concerns. You will get a MyChart message within the next two days asking about your experience. I hope that your e-visit has been valuable and will speed your recovery.

## 2017-07-01 MED FILL — FLUCONAZOLE 150 MG TABLET: 150 | 1 days supply | Qty: 1 | Fill #0

## 2017-07-01 MED FILL — metroNIDAZOLE 500 MG TABS: 500 | 7 days supply | Qty: 14 | Fill #0

## 2017-07-19 ENCOUNTER — Other Ambulatory Visit (HOSPITAL_BASED_OUTPATIENT_CLINIC_OR_DEPARTMENT_OTHER): Payer: Self-pay | Admitting: Obstetrics and Gynecology

## 2017-07-19 ENCOUNTER — Ambulatory Visit (HOSPITAL_BASED_OUTPATIENT_CLINIC_OR_DEPARTMENT_OTHER)
Admission: RE | Admit: 2017-07-19 | Discharge: 2017-07-19 | Disposition: A | Discharge status: Home / Self Care | Payer: 59 | Source: Ambulatory Visit | Attending: Obstetrics and Gynecology | Admitting: Obstetrics and Gynecology

## 2017-07-19 DIAGNOSIS — Z1231 Encounter for screening mammogram for malignant neoplasm of breast: Secondary | ICD-10-CM

## 2017-08-17 DIAGNOSIS — L810 Postinflammatory hyperpigmentation: Secondary | ICD-10-CM | POA: Diagnosis not present

## 2017-08-17 DIAGNOSIS — Z79899 Other long term (current) drug therapy: Secondary | ICD-10-CM | POA: Diagnosis not present

## 2017-08-17 DIAGNOSIS — L52 Erythema nodosum: Secondary | ICD-10-CM | POA: Diagnosis not present

## 2017-08-17 DIAGNOSIS — Z5181 Encounter for therapeutic drug level monitoring: Secondary | ICD-10-CM | POA: Diagnosis not present

## 2017-08-17 DIAGNOSIS — R229 Localized swelling, mass and lump, unspecified: Secondary | ICD-10-CM | POA: Diagnosis not present

## 2017-08-17 DIAGNOSIS — D485 Neoplasm of uncertain behavior of skin: Secondary | ICD-10-CM | POA: Diagnosis not present

## 2017-08-17 DIAGNOSIS — I776 Arteritis, unspecified: Secondary | ICD-10-CM | POA: Diagnosis not present

## 2017-08-29 DIAGNOSIS — D500 Iron deficiency anemia secondary to blood loss (chronic): Secondary | ICD-10-CM | POA: Diagnosis not present

## 2017-08-29 DIAGNOSIS — Z79899 Other long term (current) drug therapy: Secondary | ICD-10-CM | POA: Diagnosis not present

## 2017-08-29 DIAGNOSIS — L52 Erythema nodosum: Secondary | ICD-10-CM | POA: Diagnosis not present

## 2017-08-29 MED FILL — FERREX 150 CAPSULE: 150 | 90 days supply | Qty: 90 | Fill #0

## 2017-08-29 MED FILL — DAPSONE 25 MG TABS: 25 | 45 days supply | Qty: 60 | Fill #0

## 2017-09-06 ENCOUNTER — Telehealth: Payer: 59 | Admitting: Nurse Practitioner

## 2017-09-06 DIAGNOSIS — N3000 Acute cystitis without hematuria: Secondary | ICD-10-CM

## 2017-09-06 MED ORDER — CEPHALEXIN 500 MG PO CAPS: 500.0000 mg | ORAL_CAPSULE | Freq: Two times a day (BID) | ORAL | 0 refills | Status: DC

## 2017-09-06 NOTE — Progress Notes (Signed)

## 2017-09-07 MED FILL — CEPHALEXIN 500 MG CAPSULE: 500 | 7 days supply | Qty: 14 | Fill #0

## 2017-09-28 DIAGNOSIS — Z79899 Other long term (current) drug therapy: Secondary | ICD-10-CM | POA: Diagnosis not present

## 2017-11-07 DIAGNOSIS — Z79899 Other long term (current) drug therapy: Secondary | ICD-10-CM | POA: Diagnosis not present

## 2017-11-07 DIAGNOSIS — L52 Erythema nodosum: Secondary | ICD-10-CM | POA: Diagnosis not present

## 2017-11-29 MED FILL — DAPSONE 25 MG TABS: 25 | 30 days supply | Qty: 60 | Fill #0

## 2017-12-02 DIAGNOSIS — Z79899 Other long term (current) drug therapy: Secondary | ICD-10-CM | POA: Diagnosis not present

## 2017-12-16 DIAGNOSIS — Z131 Encounter for screening for diabetes mellitus: Secondary | ICD-10-CM | POA: Diagnosis not present

## 2017-12-16 DIAGNOSIS — R32 Unspecified urinary incontinence: Secondary | ICD-10-CM | POA: Diagnosis not present

## 2017-12-16 DIAGNOSIS — Z1329 Encounter for screening for other suspected endocrine disorder: Secondary | ICD-10-CM | POA: Diagnosis not present

## 2017-12-16 DIAGNOSIS — Z13220 Encounter for screening for lipoid disorders: Secondary | ICD-10-CM | POA: Diagnosis not present

## 2017-12-16 DIAGNOSIS — Z1159 Encounter for screening for other viral diseases: Secondary | ICD-10-CM | POA: Diagnosis not present

## 2017-12-16 DIAGNOSIS — Z118 Encounter for screening for other infectious and parasitic diseases: Secondary | ICD-10-CM | POA: Diagnosis not present

## 2017-12-16 DIAGNOSIS — Z114 Encounter for screening for human immunodeficiency virus [HIV]: Secondary | ICD-10-CM | POA: Diagnosis not present

## 2017-12-16 DIAGNOSIS — Z01419 Encounter for gynecological examination (general) (routine) without abnormal findings: Secondary | ICD-10-CM | POA: Diagnosis not present

## 2017-12-16 DIAGNOSIS — R339 Retention of urine, unspecified: Secondary | ICD-10-CM | POA: Diagnosis not present

## 2017-12-16 DIAGNOSIS — Z0000 Encounter for general adult medical examination without abnormal findings: Secondary | ICD-10-CM | POA: Diagnosis not present

## 2017-12-16 DIAGNOSIS — Z6832 Body mass index (BMI) 32.0-32.9, adult: Secondary | ICD-10-CM | POA: Diagnosis not present

## 2017-12-16 DIAGNOSIS — Z130 Encounter for screening for diseases of the blood and blood-forming organs and certain disorders involving the immune mechanism: Secondary | ICD-10-CM | POA: Diagnosis not present

## 2018-02-15 DIAGNOSIS — Z79899 Other long term (current) drug therapy: Secondary | ICD-10-CM | POA: Diagnosis not present

## 2018-02-15 DIAGNOSIS — L52 Erythema nodosum: Secondary | ICD-10-CM | POA: Diagnosis not present

## 2018-02-15 DIAGNOSIS — L810 Postinflammatory hyperpigmentation: Secondary | ICD-10-CM | POA: Diagnosis not present

## 2018-02-15 DIAGNOSIS — R609 Edema, unspecified: Secondary | ICD-10-CM | POA: Diagnosis not present

## 2018-02-15 MED FILL — COLCHICINE 0.6 MG CAPSULE: 0.6 | 30 days supply | Qty: 60 | Fill #0

## 2018-02-23 ENCOUNTER — Encounter: Payer: Self-pay | Admitting: Medical

## 2018-02-23 ENCOUNTER — Telehealth: Payer: Self-pay | Admitting: Medical

## 2018-02-23 ENCOUNTER — Ambulatory Visit (INDEPENDENT_AMBULATORY_CARE_PROVIDER_SITE_OTHER): Payer: 59 | Admitting: Medical

## 2018-02-23 VITALS — BP 107/71 | HR 86 | Temp 97.9°F | Resp 16 | Ht 66.0 in | Wt 195.0 lb

## 2018-02-23 DIAGNOSIS — R350 Frequency of micturition: Secondary | ICD-10-CM | POA: Diagnosis not present

## 2018-02-23 DIAGNOSIS — R768 Other specified abnormal immunological findings in serum: Secondary | ICD-10-CM | POA: Diagnosis not present

## 2018-02-23 DIAGNOSIS — R252 Cramp and spasm: Secondary | ICD-10-CM

## 2018-02-23 DIAGNOSIS — R5383 Other fatigue: Secondary | ICD-10-CM | POA: Diagnosis not present

## 2018-02-23 DIAGNOSIS — L52 Erythema nodosum: Secondary | ICD-10-CM

## 2018-02-23 DIAGNOSIS — M7989 Other specified soft tissue disorders: Secondary | ICD-10-CM | POA: Diagnosis not present

## 2018-02-23 DIAGNOSIS — R7689 Other specified abnormal immunological findings in serum: Secondary | ICD-10-CM

## 2018-02-23 DIAGNOSIS — L739 Follicular disorder, unspecified: Secondary | ICD-10-CM | POA: Diagnosis not present

## 2018-02-23 DIAGNOSIS — D649 Anemia, unspecified: Secondary | ICD-10-CM

## 2018-02-23 LAB — POC URINALSYSI DIPSTICK (AUTOMATED)
Bilirubin, UA: NEGATIVE
Blood, UA: NEGATIVE
GLUCOSE UA: NEGATIVE
KETONES UA: NEGATIVE
Leukocytes, UA: NEGATIVE
Nitrite, UA: NEGATIVE
PH UA: 6 (ref 5.0–8.0)
Protein, UA: NEGATIVE
Urobilinogen, UA: NEGATIVE E.U./dL — AB

## 2018-02-23 LAB — CBC WITH DIFFERENTIAL/PLATELET
BASOS ABS: 0 10*3/uL (ref 0.0–0.1)
BASOS PCT: 0.8 % (ref 0.0–3.0)
EOS ABS: 0.1 10*3/uL (ref 0.0–0.7)
Eosinophils Relative: 1.8 % (ref 0.0–5.0)
HCT: 34 % — ABNORMAL LOW (ref 36.0–46.0)
HEMOGLOBIN: 10.7 g/dL — AB (ref 12.0–15.0)
Lymphocytes Relative: 27.3 % (ref 12.0–46.0)
Lymphs Abs: 1.2 10*3/uL (ref 0.7–4.0)
MCHC: 31.6 g/dL (ref 30.0–36.0)
MCV: 81.2 fl (ref 78.0–100.0)
MONOS PCT: 9.6 % (ref 3.0–12.0)
Monocytes Absolute: 0.4 10*3/uL (ref 0.1–1.0)
NEUTROS ABS: 2.7 10*3/uL (ref 1.4–7.7)
Neutrophils Relative %: 60.5 % (ref 43.0–77.0)
PLATELETS: 395 10*3/uL (ref 150.0–400.0)
RBC: 4.18 Mil/uL (ref 3.87–5.11)
RDW: 15.8 % — ABNORMAL HIGH (ref 11.5–15.5)
WBC: 4.4 10*3/uL (ref 4.0–10.5)

## 2018-02-23 LAB — MAGNESIUM: Magnesium: 2.1 mg/dL (ref 1.5–2.5)

## 2018-02-23 LAB — COMPREHENSIVE METABOLIC PANEL
ALBUMIN: 4.1 g/dL (ref 3.5–5.2)
ALK PHOS: 72 U/L (ref 39–117)
ALT: 10 U/L (ref 0–35)
AST: 12 U/L (ref 0–37)
BILIRUBIN TOTAL: 0.4 mg/dL (ref 0.2–1.2)
BUN: 13 mg/dL (ref 6–23)
CALCIUM: 9.6 mg/dL (ref 8.4–10.5)
CO2: 32 mEq/L (ref 19–32)
CREATININE: 0.83 mg/dL (ref 0.40–1.20)
Chloride: 103 mEq/L (ref 96–112)
GFR: 97.14 mL/min (ref >60.00)
Glucose, Bld: 98 mg/dL (ref 70–99)
Potassium: 4.2 mEq/L (ref 3.5–5.1)
Sodium: 141 mEq/L (ref 135–145)
Total Protein: 7.3 g/dL (ref 6.0–8.3)

## 2018-02-23 LAB — VITAMIN D 25 HYDROXY (VIT D DEFICIENCY, FRACTURES): VITD: 9.57 ng/mL — ABNORMAL LOW (ref 30.00–100.00)

## 2018-02-23 LAB — VITAMIN B12: VITAMIN B 12: 353 pg/mL (ref 211–911)

## 2018-02-23 LAB — TSH: TSH: 1.26 u[IU]/mL (ref 0.35–4.50)

## 2018-02-23 MED ORDER — FLUCONAZOLE 150 MG PO TABS
150.0000 mg | ORAL_TABLET | Freq: Once | ORAL | 0 refills | Status: AC
Start: 2018-02-23 — End: 2018-02-23

## 2018-02-23 MED ORDER — VITAMIN D (ERGOCALCIFEROL) 1.25 MG (50000 UNIT) PO CAPS: 50000.0000 [IU] | ORAL_CAPSULE | ORAL | 0 refills | Status: DC

## 2018-02-23 MED ORDER — DOXYCYCLINE HYCLATE 100 MG PO TABS: 100.0000 mg | ORAL_TABLET | Freq: Two times a day (BID) | ORAL | 0 refills | Status: DC

## 2018-02-23 MED FILL — FLUCONAZOLE 150 MG TABS: 150 | 1 days supply | Qty: 1 | Fill #0

## 2018-02-23 MED FILL — DOXYCYCLINE HYCLATE 100 MG: 100 | 7 days supply | Qty: 14 | Fill #0

## 2018-02-23 NOTE — Patient Instructions (Addendum)
For your history of erythema nodosum, I recommend that you continue to follow-up with your dermatologist.  I will try to review records of those visits with dermatologist in epic.  For your frequent urination, I am going to get medical assistant to run the UA and also asked her to do urine culture.  For history of left lower extremity swelling with some induration today, I am prescribing doxycycline antibiotic.  Rx advisement given.  You might have some folliculitis presently and a possible skin infection.  We will see how this area of induration response.  In addition I do want you to go ahead and get left lower extremity ultrasound as this swelling has occurred intermittently for some time.  For fatigue, I am getting a CMP, CBC, TSH, vitamin D, B12 and B1.  You report possible history of positive ANA so I will go ahead and repeat that.  If ANA again positive might refer you to a rheumatologist.  I am making Diflucan available for potential yeast infection when you take doxycycline.  If you do have to take Diflucan then recommend holding colchicine for 24 hours before taking the Diflucan and hold for another 24 hours after a tablet as well.  Follow-up in 10 to 14 days or as needed.

## 2018-02-23 NOTE — Telephone Encounter (Signed)
Future ifob placed and rx vit d sent to pharmacy.

## 2018-02-23 NOTE — Progress Notes (Signed)
Subjective:    Patient ID: Nicole Donaldson, female    DOB: 09/09/1975, 42 y.o.   MRN: [id number]  HPI  Pt in for first time.  Pt here to get established.  Pt works Aeronautical engineer.  Does not exercise. Pt nonsmoker. Rare alcohol use. 1 child.  Pt does describe lower extremity spots on her leg. Pt states her prior pcp has referred her to rheumatologist was negative. Also had chest xray which was negative. Then states possible +ANA.  Pt was diagnosed with erythema nodosum. She has been getting some recurrent nodules. Last biopsy did show +. This was done by dermatologist. She is on dapsone initially. Now states given colchicine.  They are following her for skin lesion.  Pt just recently had some left lower ext swelling and tenderness to left lower ext medial aspect. This area has been swelling on and off for 1 month. Past month leg swelling despite rest. Pt denies any rt side swelling to lower ext.  Also pt has some intermittent leg cramping but not daily.  In addition she reports history of daily fatigue. States works irregular schedule but also has child to take care of.  LMP- one week ago and was normal    Review of Systems  Constitutional: Positive for fatigue. Negative for chills and fever.  Respiratory: Negative for cough, chest tightness, shortness of breath and wheezing.   Cardiovascular: Negative for chest pain and palpitations.       Pt also mentioned briefly transient brief atypical chest pain at times. But non presently.  Gastrointestinal: Negative for abdominal pain, constipation and vomiting.  Genitourinary: Positive for frequency. Negative for difficulty urinating, dysuria, menstrual problem and urgency.  Musculoskeletal:       Leg swelling medial aspect distal aspect.   Skin: Positive for rash. Negative for color change.       Hx of rash and dx with erythema nodosum per pt.  Hematological: Negative for adenopathy. Does not bruise/bleed easily.    Psychiatric/Behavioral: Negative for behavioral problems. The patient is not nervous/anxious and is not hyperactive.    Past Medical History:  Diagnosis Date  . Anemia   . Asthma    childhood exercise induced  . Enlarged thyroid    during pregnancy  . Gestational diabetes    diet controlled  . Headache    Migraines  . History of blood transfusion   . History of hiatal hernia   . No pertinent past medical history   . Pinched nerve in neck      Social History   Socioeconomic History  . Marital status: Single    Spouse name: Not on file  . Number of children: Not on file  . Years of education: Not on file  . Highest education level: Not on file  Occupational History  . Not on file  Social Needs  . Financial resource strain: Not on file  . Food insecurity:    Worry: Not on file    Inability: Not on file  . Transportation needs:    Medical: Not on file    Non-medical: Not on file  Tobacco Use  . Smoking status: Never Smoker  . Smokeless tobacco: Never Used  Substance and Sexual Activity  . Alcohol use: No    Alcohol/week: 0.0 standard drinks  . Drug use: No  . Sexual activity: Never    Birth control/protection: Pill  Lifestyle  . Physical activity:    Days per week: Not on file  Minutes per session: Not on file  . Stress: Not on file  Relationships  . Social connections:    Talks on phone: Not on file    Gets together: Not on file    Attends religious service: Not on file    Active member of club or organization: Not on file    Attends meetings of clubs or organizations: Not on file    Relationship status: Not on file  . Intimate partner violence:    Fear of current or ex partner: Not on file    Emotionally abused: Not on file    Physically abused: Not on file    Forced sexual activity: Not on file  Other Topics Concern  . Not on file  Social History Narrative  . Not on file    Past Surgical History:  Procedure Laterality Date  . COLONOSCOPY     . DILATATION & CURETTAGE/HYSTEROSCOPY WITH MYOSURE N/A 03/19/2016   Procedure: DILATATION & CURETTAGE/HYSTEROSCOPY WITH MYOSURE;  Surgeon: Servando Salina, MD;  Location: [facility];  Service: Gynecology;  Laterality: N/A;  . NO PAST SURGERIES    . UPPER GI ENDOSCOPY      Family History  Problem Relation Age of Onset  . Cancer Mother        breast  . Hypertension Mother   . Thyroid disease Mother   . Early death Father   . Cancer Maternal Grandmother        bladder  . Diabetes Maternal Grandmother   . Hypertension Maternal Grandmother   . COPD Maternal Grandfather   . Heart disease Maternal Grandfather     Allergies  Allergen Reactions  . Clarithromycin Swelling    Lip swelling  . Sudafed [Pseudoephedrine Hcl] Swelling    Lip swelling    Current Outpatient Medications on File Prior to Visit  Medication Sig Dispense Refill  . Aspirin-Salicylamide-Caffeine (BC HEADACHE POWDER PO) Take 1 packet by mouth daily as needed (migraine).     . cephALEXin (KEFLEX) 500 MG capsule Take 1 capsule (500 mg total) by mouth 2 (two) times daily. 14 capsule 0  . Colchicine 0.6 MG CAPS 0.6 mg.    . dapsone 25 MG tablet   3  . ibuprofen (ADVIL,MOTRIN) 800 MG tablet Take 1 tablet (800 mg total) by mouth every 8 (eight) hours as needed for headache. 30 tablet 1   No current facility-administered medications on file prior to visit.     BP 107/71   Pulse 86   Temp 97.9 F (36.6 C) (Oral)   Resp 16   Ht 5\' 6"  (1.676 m)   Wt 195 lb (88.5 kg)   SpO2 100%   BMI 31.47 kg/m       Objective:   Physical Exam  General- No acute distress. Pleasant patient. Neck- Full range of motion, no jvd. No obvious thryomegaly. Lungs- Clear, even and unlabored. Heart- regular rate and rhythm. Neurologic- CNII- XII grossly intact.  Left lower ext- medial lower 1/3 aspect of left calf has 8 cm x 2 cm. Area is indurrated and tender.  Skin- scattered scars/hyperpitmented area on both calfs.      Assessment & Plan:  For your history of erythema nodosum, I recommend that you continue to follow-up with your dermatologist.  I will try to review records of those visits with dermatologist in epic.  For your frequent urination, I am going to get medical assistant to run the UA and also asked her to do urine culture.  For history  of left lower extremity swelling with some induration today, I am prescribing doxycycline antibiotic.  Rx advisement given.  You might have some folliculitis presently and a possible skin infection.  We will see how this area of induration response.  In addition I do want you to go ahead and get left lower extremity ultrasound as this swelling has occurred intermittently for some time.  For fatigue, I am getting a CMP, CBC, TSH, vitamin D, B12 and B1.  You report possible history of positive ANA so I will go ahead and repeat that.  If ANA again positive might refer you to a rheumatologist.  I am making Diflucan available for potential yeast infection when you take doxycycline.  If you do have to take Diflucan then recommend holding colchicine for 24 hours before taking the Diflucan and hold for another 24 hours after a tablet as well.  Follow-up in 10 to 14 days or as needed.  Note on follow up plan to get baseline ekg. Today addressed the above complaints and since her atypical sensation not present did not do ekg.  Mackie Pai, PA-C

## 2018-02-24 ENCOUNTER — Other Ambulatory Visit (INDEPENDENT_AMBULATORY_CARE_PROVIDER_SITE_OTHER): Payer: 59

## 2018-02-24 ENCOUNTER — Other Ambulatory Visit: Payer: Self-pay | Admitting: Medical

## 2018-02-24 DIAGNOSIS — D649 Anemia, unspecified: Secondary | ICD-10-CM | POA: Diagnosis not present

## 2018-02-24 DIAGNOSIS — M7989 Other specified soft tissue disorders: Secondary | ICD-10-CM

## 2018-02-24 LAB — IBC PANEL
IRON: 53 ug/dL (ref 42–145)
SATURATION RATIOS: 12.2 % — AB (ref 20.0–50.0)
Transferrin: 311 mg/dL (ref 212.0–360.0)

## 2018-02-24 MED FILL — VIT D2 1.25 MG (50,000 UNIT: 1.25 MG | 56 days supply | Qty: 8 | Fill #0

## 2018-02-25 LAB — URINE CULTURE
MICRO NUMBER:: 91003398
SPECIMEN QUALITY: ADEQUATE

## 2018-02-27 ENCOUNTER — Ambulatory Visit: Payer: 59

## 2018-02-27 DIAGNOSIS — M7989 Other specified soft tissue disorders: Secondary | ICD-10-CM

## 2018-02-27 DIAGNOSIS — R600 Localized edema: Secondary | ICD-10-CM | POA: Diagnosis not present

## 2018-02-27 LAB — ANTI-NUCLEAR AB-TITER (ANA TITER): ANA Titer 1: 1:40 {titer} — ABNORMAL HIGH

## 2018-02-27 LAB — VITAMIN B1: VITAMIN B1 (THIAMINE): 11 nmol/L (ref 8–30)

## 2018-02-27 LAB — ANA: Anti Nuclear Antibody(ANA): POSITIVE — AB

## 2018-02-28 ENCOUNTER — Telehealth: Payer: Self-pay | Admitting: Medical

## 2018-02-28 DIAGNOSIS — R768 Other specified abnormal immunological findings in serum: Secondary | ICD-10-CM

## 2018-02-28 NOTE — Telephone Encounter (Signed)
Copied from [place] [id number]. Topic: Quick Communication - See Telephone Encounter >> Feb 28, 2018 11:34 AM Reyne Dumas L wrote: CRM for notification. See Telephone encounter for: 02/28/18.  Pt calling after getting lab results through [facility].  States there were questions asked by the provider but she doesn't know how to use MyChart to answer. Is she having joint/muscle pain?  The only severe pain she is having is in the area that was discussed at the office.  States she gets occasional shoulder pain, but not related to this. 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 Does pt have heavy cycles?  Cycle was 2 weeks ago and she just started spotting again - pt had an emergency blood transfusion in 2017 because of passing clots.  Pt states that she did pass clots on her last cycle but not enough to need transfusion. Pt states before she saw Doctor she tried to reach out to rheumatologist Dr. Amil Amen - but doctor has moved and she was told she had to physically pick records up.  Pt wants to know if a referral is placed from this office will she need to pick up records from Dr. Amil Amen or can we get them.

## 2018-03-01 ENCOUNTER — Other Ambulatory Visit (HOSPITAL_BASED_OUTPATIENT_CLINIC_OR_DEPARTMENT_OTHER): Payer: 59

## 2018-03-01 ENCOUNTER — Other Ambulatory Visit: Payer: Self-pay

## 2018-03-01 ENCOUNTER — Encounter (HOSPITAL_BASED_OUTPATIENT_CLINIC_OR_DEPARTMENT_OTHER): Payer: Self-pay | Admitting: *Deleted

## 2018-03-01 ENCOUNTER — Emergency Department (HOSPITAL_BASED_OUTPATIENT_CLINIC_OR_DEPARTMENT_OTHER)
Admission: EM | Admit: 2018-03-01 | Discharge: 2018-03-02 | Disposition: A | Discharge status: Home / Self Care | Payer: 59 | Attending: Emergency Medicine | Admitting: Emergency Medicine

## 2018-03-01 ENCOUNTER — Emergency Department (HOSPITAL_BASED_OUTPATIENT_CLINIC_OR_DEPARTMENT_OTHER): Payer: 59

## 2018-03-01 ENCOUNTER — Other Ambulatory Visit: Payer: 59

## 2018-03-01 DIAGNOSIS — R0602 Shortness of breath: Secondary | ICD-10-CM | POA: Insufficient documentation

## 2018-03-01 DIAGNOSIS — R2243 Localized swelling, mass and lump, lower limb, bilateral: Secondary | ICD-10-CM | POA: Insufficient documentation

## 2018-03-01 DIAGNOSIS — M79661 Pain in right lower leg: Secondary | ICD-10-CM | POA: Diagnosis not present

## 2018-03-01 DIAGNOSIS — Z79899 Other long term (current) drug therapy: Secondary | ICD-10-CM | POA: Diagnosis not present

## 2018-03-01 DIAGNOSIS — R0789 Other chest pain: Secondary | ICD-10-CM | POA: Diagnosis not present

## 2018-03-01 DIAGNOSIS — R079 Chest pain, unspecified: Secondary | ICD-10-CM | POA: Diagnosis not present

## 2018-03-01 DIAGNOSIS — J45909 Unspecified asthma, uncomplicated: Secondary | ICD-10-CM | POA: Insufficient documentation

## 2018-03-01 LAB — D-DIMER, QUANTITATIVE (NOT AT ARMC): D-Dimer, Quant: 0.47 ug/mL-FEU (ref 0.00–0.50)

## 2018-03-01 LAB — TROPONIN I: Troponin I: <0.03 ng/mL (ref <0.03)

## 2018-03-01 NOTE — ED Triage Notes (Signed)
Pt c/o left sided chest, SOb " squeezing" x 2 hrs

## 2018-03-01 NOTE — ED Provider Notes (Signed)
[facility] EMERGENCY DEPARTMENT Provider Note   CSN: [id number] Arrival date & time: 03/01/18  2123     History   Chief Complaint Chief Complaint  Patient presents with  . Chest Pain    HPI Nicole Donaldson is a 42 y.o. female.  HPI   42 year old female with chest pain.  Symptom onset 2 hours prior to my evaluation.  She describes her symptoms as "squeezing" and "tightness."  Constant since onset.  Associate with some mild shortness of breath.  No fevers or chills.  No cough.  No diagnosed cardiac history.  She has chronic lower extremity swelling and leg pain secondary to erythema nodosum.  Denies any past history of DVT/PE.  Past Medical History:  Diagnosis Date  . Anemia   . Asthma    childhood exercise induced  . Enlarged thyroid    during pregnancy  . Gestational diabetes    diet controlled  . Headache    Migraines  . History of blood transfusion   . History of hiatal hernia   . No pertinent past medical history   . Pinched nerve in neck     Patient Active Problem List   Diagnosis Date Noted  . Iron deficiency anemia due to chronic blood loss 02/20/2016  . Neck pain 11/13/2014  . Vitamin B12 deficiency 11/21/2013  . Vasculitis ([place]) 05/15/2013  . Erythema nodosum 05/15/2013  . Situational stress 04/04/2013  . Insomnia 04/04/2013  . Vitamin D deficiency 04/04/2013  . B12 deficiency 03/28/2013  . Rash and nonspecific skin eruption 03/28/2013  . Postpartum care following vaginal delivery (11/7) 05/14/2011  . TINEA CORPORIS 03/03/2010  . ACNE VULGARIS 03/03/2010  . CANDIDIASIS, VAGINAL 11/26/2008  . ANEMIA-NOS 11/26/2008  . DEPRESSION 11/26/2008  . GERD 11/26/2008  . CONSTIPATION, CHRONIC 11/26/2008  . HEADACHE 11/26/2008  . POSITIVE PPD 11/26/2008    Past Surgical History:  Procedure Laterality Date  . COLONOSCOPY    . DILATATION & CURETTAGE/HYSTEROSCOPY WITH MYOSURE N/A 03/19/2016   Procedure: DILATATION & CURETTAGE/HYSTEROSCOPY WITH  MYOSURE;  Surgeon: Servando Salina, MD;  Location: [facility];  Service: Gynecology;  Laterality: N/A;  . NO PAST SURGERIES    . UPPER GI ENDOSCOPY       OB History    Gravida  1   Para  1   Term  1   Preterm      AB      Living  1     SAB      TAB      Ectopic      Multiple      Live Births  1            Home Medications    Prior to Admission medications   Medication Sig Start Date End Date Taking? Authorizing Provider  Aspirin-Salicylamide-Caffeine (BC HEADACHE POWDER PO) Take 1 packet by mouth daily as needed (migraine).     [provider]  Colchicine 0.6 MG CAPS 0.6 mg. 02/15/18   [provider]  dapsone 25 MG tablet  11/29/17   [provider]  doxycycline (VIBRA-TABS) 100 MG tablet Take 1 tablet (100 mg total) by mouth 2 (two) times daily. 02/23/18   Saguier, Percell Miller, PA-C  ibuprofen (ADVIL,MOTRIN) 800 MG tablet Take 1 tablet (800 mg total) by mouth every 8 (eight) hours as needed for headache. 03/19/16   Servando Salina, MD  Vitamin D, Ergocalciferol, (DRISDOL) 50000 units CAPS capsule Take 1 capsule (50,000 Units total) by mouth every 7 (  seven) days. 02/23/18   Saguier, Percell Miller, PA-C    Family History Family History  Problem Relation Age of Onset  . Cancer Mother        breast  . Hypertension Mother   . Thyroid disease Mother   . Early death Father   . Cancer Maternal Grandmother        bladder  . Diabetes Maternal Grandmother   . Hypertension Maternal Grandmother   . COPD Maternal Grandfather   . Heart disease Maternal Grandfather     Social History Social History   Tobacco Use  . Smoking status: Never Smoker  . Smokeless tobacco: Never Used  Substance Use Topics  . Alcohol use: No    Alcohol/week: 0.0 standard drinks  . Drug use: No     Allergies   Clarithromycin and Sudafed [pseudoephedrine hcl]   Review of Systems Review of Systems  All systems reviewed and negative, other than as noted in  HPI.  Physical Exam Updated Vital Signs Pulse 87   Temp 98.3 F (36.8 C)   Resp 16   Ht 5\' 6"  (1.676 m)   Wt 88.5 kg   LMP 02/13/2018   SpO2 100%   BMI 31.47 kg/m   Physical Exam  Constitutional: She appears well-developed and well-nourished. No distress.  HENT:  Head: Normocephalic and atraumatic.  Eyes: Pupils are equal, round, and reactive to light. Conjunctivae and EOM are normal. Right eye exhibits no discharge. Left eye exhibits no discharge.  Neck: Neck supple.  Cardiovascular: Normal rate, regular rhythm and normal heart sounds. Exam reveals no gallop and no friction rub.  No murmur heard. Pulmonary/Chest: Effort normal and breath sounds normal. No respiratory distress.  Abdominal: Soft. She exhibits no distension. There is no tenderness.  Musculoskeletal:       Right lower leg: She exhibits tenderness and edema.  Chronic appearing skin changes to bilateral lower extremities.  Symmetric edema.  Neurological: She is alert.  Skin: Skin is warm and dry.  Psychiatric: She has a normal mood and affect. Her behavior is normal. Thought content normal.  Nursing note and vitals reviewed.    ED Treatments / Results  Labs (all labs ordered are listed, but only abnormal results are displayed) Labs Reviewed  TROPONIN I  D-DIMER, QUANTITATIVE (NOT AT [facility])  TROPONIN I    EKG EKG Interpretation  Date/Time:  Wednesday March 01 2018 21:39:19 EDT Ventricular Rate:  81 PR Interval:    QRS Duration: 87 QT Interval:  393 QTC Calculation: 457 R Axis:   26 Text Interpretation:  Sinus rhythm Abnormal R-wave progression, early transition No old tracing to compare Confirmed by Virgel Manifold [phone]) on 03/01/2018 11:26:29 PM   Radiology Dg Chest 2 View  Result Date: 03/01/2018 CLINICAL DATA:  42 year old female with chest pain. EXAM: CHEST - 2 VIEW COMPARISON:  None. FINDINGS: The heart size and mediastinal contours are within normal limits. Both lungs are clear. The  visualized skeletal structures are unremarkable. IMPRESSION: No active cardiopulmonary disease. Electronically Signed   By: Anner Crete M.D.   On: 03/01/2018 23:08    Procedures Procedures (including critical care time)  Medications Ordered in ED Medications - No data to display   Initial Impression / Assessment and Plan / ED Course   I have reviewed the triage vital signs and the nursing notes.  Pertinent labs & imaging results that were available during my care of the patient were reviewed by me and considered in my medical decision making (see chart  for details).  42 year old female with chest tightness.  Seems atypical for ACS.  Initial troponin is normal but symptoms began approximately 2 hours prior to it being drawn.  Will repeat.  She has a normal-appearing EKG.  She is afebrile.  She has no increased work of breathing.  Oxygen saturations are normal.  Her d-dimer is normal.  Final Clinical Impressions(s) / ED Diagnoses   Final diagnoses:  Nonspecific chest pain    ED Discharge Orders    None       Virgel Manifold, MD 03/01/18 2334

## 2018-03-02 ENCOUNTER — Telehealth: Payer: Self-pay | Admitting: Medical

## 2018-03-02 DIAGNOSIS — R0602 Shortness of breath: Secondary | ICD-10-CM | POA: Diagnosis not present

## 2018-03-02 DIAGNOSIS — J45909 Unspecified asthma, uncomplicated: Secondary | ICD-10-CM | POA: Diagnosis not present

## 2018-03-02 DIAGNOSIS — M79661 Pain in right lower leg: Secondary | ICD-10-CM | POA: Diagnosis not present

## 2018-03-02 DIAGNOSIS — R768 Other specified abnormal immunological findings in serum: Secondary | ICD-10-CM

## 2018-03-02 DIAGNOSIS — R079 Chest pain, unspecified: Secondary | ICD-10-CM | POA: Diagnosis not present

## 2018-03-02 DIAGNOSIS — Z79899 Other long term (current) drug therapy: Secondary | ICD-10-CM | POA: Diagnosis not present

## 2018-03-02 DIAGNOSIS — R2243 Localized swelling, mass and lump, lower limb, bilateral: Secondary | ICD-10-CM | POA: Diagnosis not present

## 2018-03-02 DIAGNOSIS — D649 Anemia, unspecified: Secondary | ICD-10-CM

## 2018-03-02 LAB — TROPONIN I: Troponin I: <0.03 ng/mL (ref <0.03)

## 2018-03-02 MED ORDER — GI COCKTAIL ~~LOC~~
ORAL | Status: AC
  Filled 2018-03-02: qty 30

## 2018-03-02 MED ORDER — GI COCKTAIL ~~LOC~~
30.0000 mL | Freq: Once | ORAL | Status: AC
  Administered 2018-03-02: 30 mL via ORAL

## 2018-03-02 NOTE — Telephone Encounter (Signed)
Copied from [place] [id number]. Topic: Quick Communication - See Telephone Encounter >> Mar 02, 2018  8:20 AM Mylinda Latina, NT wrote: CRM for notification. See Telephone encounter for: 03/02/18. Patient called and states that she is having pain in her left leg pain. She states the pain is controlled a little bit by medication. She is wondering  if she can get a referral to a specialist or have a MRI done. Please call patient to back CB# [phone]

## 2018-03-07 NOTE — Telephone Encounter (Signed)
Pt needs follow up appointment as well. She is requesting possible mri? These are often denied by insurance and need to see what is going on with her leg. Also may refer to specialist but would need update evaluation in order to refer to specialist. Dr Barbaraann Barthel might be able to help out but need to recheck her.   Please get her scheduled at 1 pm wed, Thursday or Friday if possile.Marland Kitchen Please don't schedule her before lunch/last patient in the morning. She has leg pain, shoulder pain, fatigue, +ANA,  heavy menses, anemia, and recent chest pain. So if possible would appreciate an open slot until 1:40 if possible.

## 2018-03-07 NOTE — Telephone Encounter (Signed)
I put in referral to rheumatologist. She may have to coordinate with [facility] to get old records. She may have to go and physically pick those up. Let her know Silvestre Moment may be calling her to get plan in place to get records. Typically new office will want to see any prior specialist work up.  What shoulder is the one hurting we could get xray if she lets me know which one.  Also with her described clots, heavy cycles and anemia, does she have gyn. Has she ha any recent US uterus. Ever been told possible fibroids? Who was her former gyn. When was last time saw by gyn?

## 2018-03-08 ENCOUNTER — Ambulatory Visit (INDEPENDENT_AMBULATORY_CARE_PROVIDER_SITE_OTHER): Payer: 59 | Admitting: Medical

## 2018-03-08 ENCOUNTER — Encounter: Payer: Self-pay | Admitting: Medical

## 2018-03-08 VITALS — BP 118/66 | HR 99 | Temp 98.1°F | Resp 16 | Ht 66.0 in | Wt 196.0 lb

## 2018-03-08 DIAGNOSIS — D649 Anemia, unspecified: Secondary | ICD-10-CM | POA: Diagnosis not present

## 2018-03-08 DIAGNOSIS — R0789 Other chest pain: Secondary | ICD-10-CM

## 2018-03-08 DIAGNOSIS — L52 Erythema nodosum: Secondary | ICD-10-CM

## 2018-03-08 DIAGNOSIS — M792 Neuralgia and neuritis, unspecified: Secondary | ICD-10-CM

## 2018-03-08 DIAGNOSIS — R768 Other specified abnormal immunological findings in serum: Secondary | ICD-10-CM

## 2018-03-08 LAB — CBC WITH DIFFERENTIAL/PLATELET
BASOS PCT: 0.6 % (ref 0.0–3.0)
Basophils Absolute: 0 10*3/uL (ref 0.0–0.1)
EOS ABS: 0.1 10*3/uL (ref 0.0–0.7)
EOS PCT: 1.1 % (ref 0.0–5.0)
HEMATOCRIT: 32.5 % — AB (ref 36.0–46.0)
HEMOGLOBIN: 10.3 g/dL — AB (ref 12.0–15.0)
LYMPHS PCT: 30.7 % (ref 12.0–46.0)
Lymphs Abs: 1.5 10*3/uL (ref 0.7–4.0)
MCHC: 31.8 g/dL (ref 30.0–36.0)
MCV: 79.7 fl (ref 78.0–100.0)
Monocytes Absolute: 0.4 10*3/uL (ref 0.1–1.0)
Monocytes Relative: 8.3 % (ref 3.0–12.0)
Neutro Abs: 2.9 10*3/uL (ref 1.4–7.7)
Neutrophils Relative %: 59.3 % (ref 43.0–77.0)
Platelets: 343 10*3/uL (ref 150.0–400.0)
RBC: 4.07 Mil/uL (ref 3.87–5.11)
RDW: 16 % — AB (ref 11.5–15.5)
WBC: 4.9 10*3/uL (ref 4.0–10.5)

## 2018-03-08 LAB — TROPONIN I: TNIDX: 0.01 ug/l (ref 0.00–0.06)

## 2018-03-08 MED ORDER — GABAPENTIN 100 MG PO CAPS: 100.0000 mg | ORAL_CAPSULE | Freq: Every day | ORAL | 3 refills | Status: DC

## 2018-03-08 MED FILL — GABAPENTIN 100 MG CAPSULE: 100 | 90 days supply | Qty: 90 | Fill #0

## 2018-03-08 NOTE — Addendum Note (Signed)
Addended by: Harl Bowie on: 03/08/2018 01:33 PM   Modules accepted: Orders

## 2018-03-08 NOTE — Progress Notes (Signed)
Subjective:    Patient ID: Nicole Donaldson, female    DOB: [birth year], 42 y.o.   MRN: [id number]  HPI  Pt in for follow up  She had some atypical chest pain and went to UC. She had negative ekg and negative cxr. Pt had negative D dimer and negative troponin. She states she will get occasional mild heavy sensation. This happened couple of times a day at rest and activity does not note difference.  This had been present for 2 weeks and transient. But day she went to ED was lasting for hours. She was not feeling panicky. I don't see lipid panel in past. Non smoker. Pt dad passed at 47 years old while asleep. He was a smoker. No chest pain presently.(then end of  Exam/interview pointed to left upper pectoralis pain that was low level and just started. Pain with movement of upper ext. No sob, no mid chest pain. No pain in arm or in jaw.  Pt had recent ANA +positive and titer mild elevated. Some fatigue and  Rt shoulder pain in past. No shoulder pain presently.   Also has erythema nodosum. Hx of on lower ext. Pt had neg [place] of lower ext in past. Pt seeing derm. She has rare occasional rt shoulder pain when rains. Xray in past shoulder was negative.  Pain her legs describes as shooting pain. Pain most medial ankle and lower rx. Pt is relying at times on ibuprofen.    She was on dapsone prescribed by dermatologist. She not on this presently.  Now she is on colchicine.  She states pain is sharp and shooting upwards medial aspect.   Also she has hx of anemia.  Hx of heavy menses in past. Now not as heavy as before.    Review of Systems  Constitutional: Negative for chills, fatigue and fever.  Respiratory: Negative for cough, chest tightness, shortness of breath and wheezing.   Cardiovascular: Negative for chest pain and palpitations.       See hpi initial no chest pain then reported some atypical vs muscle pec pain at end of exam.  Gastrointestinal: Negative for abdominal pain.  Musculoskeletal:         See hpi.  Skin: Negative for rash.  Neurological: Negative for dizziness, syncope, speech difficulty, weakness and light-headedness.  Hematological: Negative for adenopathy. Does not bruise/bleed easily.  Psychiatric/Behavioral: Negative for behavioral problems, confusion, self-injury and sleep disturbance. The patient is not nervous/anxious.     Past Medical History:  Diagnosis Date  . Anemia   . Asthma    childhood exercise induced  . Enlarged thyroid    during pregnancy  . Gestational diabetes    diet controlled  . Headache    Migraines  . History of blood transfusion   . History of hiatal hernia   . No pertinent past medical history   . Pinched nerve in neck      Social History   Socioeconomic History  . Marital status: Single    Spouse name: Not on file  . Number of children: Not on file  . Years of education: Not on file  . Highest education level: Not on file  Occupational History  . Not on file  Social Needs  . Financial resource strain: Not on file  . Food insecurity:    Worry: Not on file    Inability: Not on file  . Transportation needs:    Medical: Not on file    Non-medical: Not on file  Tobacco Use  . Smoking status: Never Smoker  . Smokeless tobacco: Never Used  Substance and Sexual Activity  . Alcohol use: No    Alcohol/week: 0.0 standard drinks  . Drug use: No  . Sexual activity: Never    Birth control/protection: Pill  Lifestyle  . Physical activity:    Days per week: Not on file    Minutes per session: Not on file  . Stress: Not on file  Relationships  . Social connections:    Talks on phone: Not on file    Gets together: Not on file    Attends religious service: Not on file    Active member of club or organization: Not on file    Attends meetings of clubs or organizations: Not on file    Relationship status: Not on file  . Intimate partner violence:    Fear of current or ex partner: Not on file    Emotionally abused: Not on  file    Physically abused: Not on file    Forced sexual activity: Not on file  Other Topics Concern  . Not on file  Social History Narrative  . Not on file    Past Surgical History:  Procedure Laterality Date  . COLONOSCOPY    . DILATATION & CURETTAGE/HYSTEROSCOPY WITH MYOSURE N/A 03/19/2016   Procedure: DILATATION & CURETTAGE/HYSTEROSCOPY WITH MYOSURE;  Surgeon: Servando Salina, MD;  Location: [facility];  Service: Gynecology;  Laterality: N/A;  . NO PAST SURGERIES    . UPPER GI ENDOSCOPY      Family History  Problem Relation Age of Onset  . Cancer Mother        breast  . Hypertension Mother   . Thyroid disease Mother   . Early death Father   . Cancer Maternal Grandmother        bladder  . Diabetes Maternal Grandmother   . Hypertension Maternal Grandmother   . COPD Maternal Grandfather   . Heart disease Maternal Grandfather     Allergies  Allergen Reactions  . Clarithromycin Swelling    Lip swelling  . Sudafed [Pseudoephedrine Hcl] Swelling    Lip swelling    Current Outpatient Medications on File Prior to Visit  Medication Sig Dispense Refill  . Aspirin-Salicylamide-Caffeine (BC HEADACHE POWDER PO) Take 1 packet by mouth daily as needed (migraine).     . Colchicine 0.6 MG CAPS 0.6 mg.    . dapsone 25 MG tablet   3  . doxycycline (VIBRA-TABS) 100 MG tablet Take 1 tablet (100 mg total) by mouth 2 (two) times daily. 14 tablet 0  . ibuprofen (ADVIL,MOTRIN) 800 MG tablet Take 1 tablet (800 mg total) by mouth every 8 (eight) hours as needed for headache. 30 tablet 1  . Vitamin D, Ergocalciferol, (DRISDOL) 50000 units CAPS capsule Take 1 capsule (50,000 Units total) by mouth every 7 (seven) days. 8 capsule 0   No current facility-administered medications on file prior to visit.     BP 118/66   Pulse 99   Temp 98.1 F (36.7 C) (Oral)   Resp 16   Ht 5\' 6"  (1.676 m)   Wt 196 lb (88.9 kg)   LMP 02/13/2018   SpO2 100%   BMI 31.64 kg/m       Objective:    Physical Exam  General- No acute distress. Pleasant patient. Neck- Full range of motion, no jvd. No obvious thryomegaly. Lungs- Clear, even and unlabored. Heart- regular rate and rhythm. Neurologic- CNII- XII grossly intact.  Left lower ext- medial lower 1/3 aspect of left calf has 8 cm x 2 cm. Area is indurrated and tender.(about the same). Area is not red.   Skin- scattered scars/hyperpigmented area on both calfs.  Anterior chest-at junction of left upper pectoralis mild tender to palpation and stretching of arm causes mild pain.        Assessment & Plan:  For your history of erythema nodosum, continue with colchicine prescribed by dermatologist.  With your positive ANA and elevated titer, I am going to try to get you back in with your former rheumatologist.  You do describe some neuropathic type pain in the lower extremities.  So I did prescribe gabapentin to use 1 tablet prior to sleep.  We will see if this helps.  For your history of anemia, we will repeat your CBC today and get iron level.  Also please turn in stool cards to check for blood.  For your history of atypical chest pain, I did refer you to cardiologist.  In light of your dad's history I do think this is a good idea.  Also today when you get the blood work done we will get lipid panel to assess her risk factor.  We had discussion today on chest region pain and associated work-up.  I can only do one set of troponin presently.  You are in the building working all day so if your pain worsens or changes then advised emergency department evaluation.  Follow-up date to be determined after lab review.  50 minutes spent with pt today. At least 20 minutes of time counseling on various diagnosis she had recently in addition to work up that we would do for her left upper pectoralis pain that started end of exam. Explained full work up would be 2 sets of troponin in ED. She declined to be seen in ED presently. She works in  [employer]. She expresses undertandig that if faint pain worsen or changes then would need to be seen in ED.

## 2018-03-08 NOTE — Patient Instructions (Addendum)
For your history of erythema nodosum, continue with colchicine prescribed by dermatologist.  With your positive ANA and elevated titer, I am going to try to get you back in with your former rheumatologist.  You do describe some neuropathic type pain in the lower extremities.  So I did prescribe gabapentin to use 1 tablet prior to sleep.  We will see if this helps.  For your history of anemia, we will repeat your CBC today and get iron level.  Also please turn in stool cards to check for blood.  For your history of atypical chest pain, I did refer you to cardiologist.  In light of your dad's history I do think this is a good idea. (ekg today showed nsr)  Also today when you get the blood work done we will get lipid panel to assess her risk factor.  We had  discussion today on chest region pain and associated work-up.  I can only do one set of troponin presently.  You are in the building working all day so if your pain worsens or changes then advised emergency department evaluation.  Follow-up date to be determined after lab review.

## 2018-03-09 LAB — IRON AND TIBC
IRON SATURATION: 12 % — AB (ref 15–55)
IRON: 43 ug/dL (ref 27–159)
Total Iron Binding Capacity: 363 ug/dL (ref 250–450)
UIBC: 320 ug/dL (ref 131–425)

## 2018-03-14 MED ORDER — FERROUS SULFATE 325 (65 FE) MG PO TABS: 325.0000 mg | ORAL_TABLET | Freq: Every day | ORAL | 3 refills | Status: DC

## 2018-03-14 MED FILL — FERROUS SULFATE 325 MG TAB: 325 (65 FE) | 100 days supply | Qty: 100 | Fill #0

## 2018-03-14 NOTE — Telephone Encounter (Signed)
Author phoned pt. to follow-up on blood clots, and L leg pain. Pt. Came in 9/4 to address concerns, and confirmed that she sees a gynecologist. Pt. Is in need of po iron order, and is planning to pick up stool cards today after work. Pt is. requesting rheumatology referral to be sent to Dr. Leigh Aurora instead, who she has seen in the past. Orders for iron and referral left pended for [facility] review.

## 2018-03-14 NOTE — Telephone Encounter (Signed)
Referral to Dr. Amil Amen placed and signed fe order.

## 2018-03-16 ENCOUNTER — Encounter: Payer: Self-pay | Admitting: Medical

## 2018-03-16 ENCOUNTER — Telehealth: Payer: Self-pay | Admitting: *Deleted

## 2018-03-16 NOTE — Telephone Encounter (Signed)
Received Medical records from [facility]; forwarded to provider/SLS 09/12

## 2018-03-17 ENCOUNTER — Encounter: Payer: Self-pay | Admitting: Cardiology

## 2018-03-17 ENCOUNTER — Ambulatory Visit: Payer: 59 | Admitting: Cardiology

## 2018-03-17 VITALS — BP 132/64 | HR 101 | Ht 66.0 in | Wt 197.0 lb

## 2018-03-17 DIAGNOSIS — R0609 Other forms of dyspnea: Secondary | ICD-10-CM | POA: Diagnosis not present

## 2018-03-17 DIAGNOSIS — R079 Chest pain, unspecified: Secondary | ICD-10-CM | POA: Diagnosis not present

## 2018-03-17 DIAGNOSIS — L52 Erythema nodosum: Secondary | ICD-10-CM | POA: Diagnosis not present

## 2018-03-17 NOTE — Progress Notes (Signed)
Cardiology Office Note:    Date:  03/17/2018   ID:  [name], DOB [birth year], MRN [id number]  PCP:  Mackie Pai, PA-C  Cardiologist:  Jenean Lindau, MD   Referring MD: Mackie Pai, PA-C    ASSESSMENT:    1. Dyspnea on exertion   2. Chest pain, unspecified type   3. Erythema nodosum    PLAN:    In order of problems listed above:  1. Primary prevention stressed to the patient.  Importance of compliance with diet and medication stressed and she vocalized understanding. 2. Her blood pressure is stable.  Diet was discussed obesity and weight reduction was stressed. 3. Her chest pain is atypical for coronary etiology.  She has shortness of breath.  In view of this we will do an echocardiogram to assess murmur heard on auscultation and to assess her symptoms of chest pain she will undergo stress echocardiography.  She knows to go to the nearest emergency room for any significant symptoms. 4. Patient will be seen in follow-up appointment in 6 months or earlier if the patient has any concerns    Medication Adjustments/Labs and Tests Ordered: Current medicines are reviewed at length with the patient today.  Concerns regarding medicines are outlined above.  No orders of the defined types were placed in this encounter.  No orders of the defined types were placed in this encounter.    History of Present Illness:    Nicole Donaldson is a 42 y.o. female who is being seen today for the evaluation of chest pain and dyspnea on exertion at the request of Nicole Donaldson, [place].  Patient is a pleasant 42 year old female.  She has past medical history of erythema nodosum.  She mentions to me that she has some dyspnea on exertion and chest tightness at times for which she is referred here.  No orthopnea or PND.  No radiation of the symptoms to any part of the body.  This is not clearly related to exertion.  It has mixed features.  She does not exercise on a regular basis.  It is  unpredictable when the symptoms happen.  She tells me that recently she had a d-dimer checked and it was negative.  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  At the time of my evaluation, the patient is alert awake oriented and in no distress.  She denies any history of hypertension, dyslipidemia or diabetes mellitus.  She has no family history of coronary artery disease.  Past Medical History:  Diagnosis Date  . Anemia   . Asthma    childhood exercise induced  . Enlarged thyroid    during pregnancy  . Gestational diabetes    diet controlled  . Headache    Migraines  . History of blood transfusion   . History of hiatal hernia   . No pertinent past medical history   . Pinched nerve in neck     Past Surgical History:  Procedure Laterality Date  . COLONOSCOPY    . DILATATION & CURETTAGE/HYSTEROSCOPY WITH MYOSURE N/A 03/19/2016   Procedure: DILATATION & CURETTAGE/HYSTEROSCOPY WITH MYOSURE;  Surgeon: Servando Salina, MD;  Location: [facility];  Service: Gynecology;  Laterality: N/A;  . NO PAST SURGERIES    . UPPER GI ENDOSCOPY      Current Medications: Current Meds  Medication Sig  . Aspirin-Salicylamide-Caffeine (BC HEADACHE POWDER PO) Take 1 packet by mouth daily as needed (migraine).   . Colchicine 0.6 MG CAPS 0.6  mg.  . doxycycline (VIBRA-TABS) 100 MG tablet Take 1 tablet (100 mg total) by mouth 2 (two) times daily.  . ferrous sulfate 325 (65 FE) MG tablet Take 1 tablet (325 mg total) by mouth daily with breakfast.  . gabapentin (NEURONTIN) 100 MG capsule Take 1 capsule (100 mg total) by mouth at bedtime.  Marland Kitchen ibuprofen (ADVIL,MOTRIN) 800 MG tablet Take 1 tablet (800 mg total) by mouth every 8 (eight) hours as needed for headache.  . Vitamin D, Ergocalciferol, (DRISDOL) 50000 units CAPS capsule Take 1 capsule (50,000 Units total) by mouth every 7 (seven) days.     Allergies:   Clarithromycin and Sudafed [pseudoephedrine hcl]   Social History    Socioeconomic History  . Marital status: Single    Spouse name: Not on file  . Number of children: Not on file  . Years of education: Not on file  . Highest education level: Not on file  Occupational History  . Not on file  Social Needs  . Financial resource strain: Not on file  . Food insecurity:    Worry: Not on file    Inability: Not on file  . Transportation needs:    Medical: Not on file    Non-medical: Not on file  Tobacco Use  . Smoking status: Never Smoker  . Smokeless tobacco: Never Used  Substance and Sexual Activity  . Alcohol use: No    Alcohol/week: 0.0 standard drinks  . Drug use: No  . Sexual activity: Never    Birth control/protection: Pill  Lifestyle  . Physical activity:    Days per week: Not on file    Minutes per session: Not on file  . Stress: Not on file  Relationships  . Social connections:    Talks on phone: Not on file    Gets together: Not on file    Attends religious service: Not on file    Active member of club or organization: Not on file    Attends meetings of clubs or organizations: Not on file    Relationship status: Not on file  Other Topics Concern  . Not on file  Social History Narrative  . Not on file     Family History: The patient's family history includes COPD in her maternal grandfather; Cancer in her maternal grandmother and mother; Diabetes in her maternal grandmother; Early death in her father; Heart disease in her maternal grandfather; Hypertension in her maternal grandmother and mother; Thyroid disease in her mother.  ROS:   Please see the history of present illness.    All other systems reviewed and are negative.  EKGs/Labs/Other Studies Reviewed:    The following studies were reviewed today: EKG reveals sinus rhythm and nonspecific ST-T changes.   Recent Labs: 02/23/2018: ALT 10; BUN 13; Creatinine, Ser 0.83; Magnesium 2.1; Potassium 4.2; Sodium 141; TSH 1.26 03/08/2018: Hemoglobin 10.3; Platelets 343.0   Recent Lipid Panel    Component Value Date/Time   CHOL 162 08/15/2014 0846   TRIG 50 08/15/2014 0846   HDL 64 08/15/2014 0846   CHOLHDL 2.5 08/15/2014 0846   VLDL 10 08/15/2014 0846   LDLCALC 88 08/15/2014 0846    Physical Exam:    VS:  BP 132/64 (BP Location: Right Arm, Patient Position: Sitting, Cuff Size: Normal)   Pulse (!) 101   Ht 5\' 6"  (1.676 m)   Wt 197 lb (89.4 kg)   SpO2 98%   BMI 31.80 kg/m     Wt Readings from Last 3 Encounters:  03/17/18 197 lb (89.4 kg)  03/08/18 196 lb (88.9 kg)  03/01/18 195 lb (88.5 kg)     GEN: Patient is in no acute distress HEENT: Normal NECK: No JVD; No carotid bruits LYMPHATICS: No lymphadenopathy CARDIAC: S1 S2 regular, 2/6 systolic murmur at the apex. RESPIRATORY:  Clear to auscultation without rales, wheezing or rhonchi  ABDOMEN: Soft, non-tender, non-distended MUSCULOSKELETAL:  No edema; No deformity  SKIN: Warm and dry NEUROLOGIC:  Alert and oriented x 3 PSYCHIATRIC:  Normal affect    Signed, Jenean Lindau, MD  03/17/2018 10:15 AM    [place]  -

## 2018-03-17 NOTE — Patient Instructions (Signed)
Medication Instructions:  Your physician recommends that you continue on your current medications as directed. Please refer to the Current Medication list given to you today.  Labwork: None  Testing/Procedures: Your physician has requested that you have an echocardiogram. Echocardiography is a painless test that uses sound waves to create images of your heart. It provides your doctor with information about the size and shape of your heart and how well your heart's chambers and valves are working. This procedure takes approximately one hour. There are no restrictions for this procedure.  Your physician has requested that you have a stress echocardiogram. For further information please visit [email or web address]. Please follow instruction sheet as given.  Follow-Up: Your physician recommends that you schedule a follow-up appointment in: 6 months  Any Other Special Instructions Will Be Listed Below (If Applicable).     If you need a refill on your cardiac medications before your next appointment, please call your pharmacy.   [place], RN, BSN

## 2018-03-17 NOTE — Addendum Note (Signed)
Addended by: Mattie Marlin on: 03/17/2018 10:21 AM   Modules accepted: Orders

## 2018-03-22 ENCOUNTER — Ambulatory Visit (HOSPITAL_BASED_OUTPATIENT_CLINIC_OR_DEPARTMENT_OTHER)
Admission: RE | Admit: 2018-03-22 | Discharge: 2018-03-22 | Disposition: A | Discharge status: Home / Self Care | Payer: 59 | Source: Ambulatory Visit | Attending: Cardiology | Admitting: Cardiology

## 2018-03-22 DIAGNOSIS — R0609 Other forms of dyspnea: Secondary | ICD-10-CM | POA: Insufficient documentation

## 2018-03-22 NOTE — Progress Notes (Signed)
  Echocardiogram 2D Echocardiogram has been performed.  Nicole Donaldson T Nicole Donaldson 03/22/2018, 11:03 AM

## 2018-03-24 ENCOUNTER — Telehealth: Payer: Self-pay

## 2018-03-24 NOTE — Telephone Encounter (Signed)
Patient called back for results, please call

## 2018-03-24 NOTE — Telephone Encounter (Signed)
Requested that the patient call the office to discuss echo.

## 2018-03-27 ENCOUNTER — Other Ambulatory Visit: Payer: Self-pay

## 2018-03-27 DIAGNOSIS — R931 Abnormal findings on diagnostic imaging of heart and coronary circulation: Secondary | ICD-10-CM

## 2018-03-30 ENCOUNTER — Ambulatory Visit (HOSPITAL_BASED_OUTPATIENT_CLINIC_OR_DEPARTMENT_OTHER): Payer: 59

## 2018-03-31 DIAGNOSIS — Z79899 Other long term (current) drug therapy: Secondary | ICD-10-CM | POA: Diagnosis not present

## 2018-03-31 DIAGNOSIS — L52 Erythema nodosum: Secondary | ICD-10-CM | POA: Diagnosis not present

## 2018-03-31 DIAGNOSIS — L810 Postinflammatory hyperpigmentation: Secondary | ICD-10-CM | POA: Diagnosis not present

## 2018-03-31 DIAGNOSIS — R609 Edema, unspecified: Secondary | ICD-10-CM | POA: Diagnosis not present

## 2018-04-06 ENCOUNTER — Telehealth: Payer: Self-pay | Admitting: *Deleted

## 2018-04-06 ENCOUNTER — Encounter: Payer: Self-pay | Admitting: *Deleted

## 2018-04-06 ENCOUNTER — Other Ambulatory Visit (INDEPENDENT_AMBULATORY_CARE_PROVIDER_SITE_OTHER): Payer: 59

## 2018-04-06 DIAGNOSIS — D649 Anemia, unspecified: Secondary | ICD-10-CM

## 2018-04-06 LAB — FECAL OCCULT BLOOD, IMMUNOCHEMICAL: FECAL OCCULT BLD: NEGATIVE

## 2018-04-06 NOTE — Telephone Encounter (Signed)
Left message regarding Cardiac MRI that is scheduled for 04/18/18 at 12 pm at [facility].  Informed patient I will be mailing all information regarding this to her and if she has questions, to please call [phone].

## 2018-04-07 ENCOUNTER — Encounter: Payer: Self-pay | Admitting: *Deleted

## 2018-04-11 ENCOUNTER — Encounter: Payer: Self-pay | Admitting: *Deleted

## 2018-04-12 DIAGNOSIS — D8989 Other specified disorders involving the immune mechanism, not elsewhere classified: Secondary | ICD-10-CM | POA: Diagnosis not present

## 2018-04-12 DIAGNOSIS — R5383 Other fatigue: Secondary | ICD-10-CM | POA: Diagnosis not present

## 2018-04-12 DIAGNOSIS — L52 Erythema nodosum: Secondary | ICD-10-CM | POA: Diagnosis not present

## 2018-04-12 DIAGNOSIS — E669 Obesity, unspecified: Secondary | ICD-10-CM | POA: Diagnosis not present

## 2018-04-12 DIAGNOSIS — Z6832 Body mass index (BMI) 32.0-32.9, adult: Secondary | ICD-10-CM | POA: Diagnosis not present

## 2018-04-12 DIAGNOSIS — R768 Other specified abnormal immunological findings in serum: Secondary | ICD-10-CM | POA: Diagnosis not present

## 2018-04-18 ENCOUNTER — Other Ambulatory Visit (HOSPITAL_COMMUNITY): Payer: 59

## 2018-04-21 ENCOUNTER — Ambulatory Visit (HOSPITAL_COMMUNITY)
Admission: RE | Admit: 2018-04-21 | Discharge: 2018-04-21 | Disposition: A | Discharge status: Home / Self Care | Payer: 59 | Source: Ambulatory Visit | Attending: Cardiology | Admitting: Cardiology

## 2018-04-21 DIAGNOSIS — R931 Abnormal findings on diagnostic imaging of heart and coronary circulation: Secondary | ICD-10-CM | POA: Diagnosis not present

## 2018-04-21 DIAGNOSIS — I340 Nonrheumatic mitral (valve) insufficiency: Secondary | ICD-10-CM | POA: Diagnosis not present

## 2018-04-21 LAB — CREATININE, SERUM
CREATININE: 0.84 mg/dL (ref 0.44–1.00)
GFR calc Af Amer: >60 mL/min (ref >60)

## 2018-04-21 MED ORDER — GADOBUTROL 1 MMOL/ML IV SOLN
10.0000 mL | Freq: Once | INTRAVENOUS | Status: AC | PRN
  Administered 2018-04-21: 10 mL via INTRAVENOUS

## 2018-04-25 ENCOUNTER — Telehealth: Payer: Self-pay | Admitting: Cardiology

## 2018-04-25 NOTE — Telephone Encounter (Signed)
New Message   Pt is calling to check on the results to her MRI, states it was suppose to stat and she has yet to hear anything. Please call

## 2018-04-26 NOTE — Telephone Encounter (Signed)
Results are in the computer and were released through mychart to the patient.

## 2018-04-26 NOTE — Telephone Encounter (Signed)
Have you heard anything about this MRI? Looks like she was placed on 9/23, is it still pending insurance?

## 2018-04-26 NOTE — Telephone Encounter (Signed)
Informed of results via voicemail; instructed to call the office with any questions or concerns.

## 2018-04-28 ENCOUNTER — Telehealth: Payer: Self-pay | Admitting: Cardiology

## 2018-04-28 NOTE — Telephone Encounter (Signed)
Spoke with patient, answered her question about MRI result. She is requesting to see Dr. Joneen Caraway to discuss this more thoroughly, and she has other questions about her stress echo, if this will still be completed. Patient advised that Dr. Geraldo Pitter and his nurse are out of the office will follow up on Monday regarding this. Scheduled patient for appointment to discuss her concerns.

## 2018-04-28 NOTE — Telephone Encounter (Signed)
Patient called and has viewed her MRI results and has questions. Please call patient.

## 2018-04-28 NOTE — Telephone Encounter (Signed)
Patient

## 2018-05-02 NOTE — Telephone Encounter (Signed)
Left voicemail to address stress echo questions.

## 2018-05-19 ENCOUNTER — Ambulatory Visit: Payer: 59 | Admitting: Cardiology

## 2018-05-19 ENCOUNTER — Encounter: Payer: Self-pay | Admitting: Cardiology

## 2018-05-19 ENCOUNTER — Other Ambulatory Visit: Payer: Self-pay | Admitting: Cardiology

## 2018-05-19 VITALS — BP 118/68 | HR 94 | Ht 66.0 in | Wt 195.0 lb

## 2018-05-19 DIAGNOSIS — Z13220 Encounter for screening for lipoid disorders: Secondary | ICD-10-CM | POA: Diagnosis not present

## 2018-05-19 DIAGNOSIS — R079 Chest pain, unspecified: Secondary | ICD-10-CM | POA: Diagnosis not present

## 2018-05-19 DIAGNOSIS — M7989 Other specified soft tissue disorders: Secondary | ICD-10-CM

## 2018-05-19 NOTE — Progress Notes (Signed)
Cardiology Office Note:    Date:  05/19/2018   ID:  Nicole Donaldson, DOB 10-01-1975, MRN [id number]  PCP:  Mackie Pai, PA-C  Cardiologist:  Jenean Lindau, MD   Referring MD: Mackie Pai, PA-C    ASSESSMENT:    1. Chest pain, unspecified type   2. Screening for cholesterol level    PLAN:    In order of problems listed above:  1. Primary prevention stressed with the patient.  Importance of compliance with diet and medications stressed and she vocalized understanding.  She is stable.  Diet was discussed for dyslipidemia.  Weight reduction was stressed 2. She will be back in the next few days for an exercise stress echo.  At that time we will do blood work including fasting lipids 3. Patient will be seen in follow-up appointment in 6 months or earlier if the patient has any concerns    Medication Adjustments/Labs and Tests Ordered: Current medicines are reviewed at length with the patient today.  Concerns regarding medicines are outlined above.  Orders Placed This Encounter  Procedures  . Hepatic function panel  . Lipid panel  . ECHOCARDIOGRAM STRESS TEST   No orders of the defined types were placed in this encounter.    No chief complaint on file.    History of Present Illness:    Nicole Donaldson is a 42 y.o. female.  Patient is here for follow-up.  She had an echocardiogram which revealed a suspicious density in the ascending aorta.  Subsequently an MRI was unremarkable.  Patient is doing fine at this time and denies any problems.  No chest pain orthopnea or PND.  She leads a sedentary lifestyle.  She has just some pressure-like symptoms at times in her chest and wants to undergo stress testing before she embarks on exercise program.  She also wants her lipids to be checked.  Past Medical History:  Diagnosis Date  . Anemia   . Asthma    childhood exercise induced  . Enlarged thyroid    during pregnancy  . Gestational diabetes    diet controlled  . Headache     Migraines  . History of blood transfusion   . History of hiatal hernia   . No pertinent past medical history   . Pinched nerve in neck     Past Surgical History:  Procedure Laterality Date  . COLONOSCOPY    . DILATATION & CURETTAGE/HYSTEROSCOPY WITH MYOSURE N/A 03/19/2016   Procedure: DILATATION & CURETTAGE/HYSTEROSCOPY WITH MYOSURE;  Surgeon: Servando Salina, MD;  Location: [facility];  Service: Gynecology;  Laterality: N/A;  . NO PAST SURGERIES    . UPPER GI ENDOSCOPY      Current Medications: Current Meds  Medication Sig  . Aspirin-Salicylamide-Caffeine (BC HEADACHE POWDER PO) Take 1 packet by mouth daily as needed (migraine).   . Colchicine 0.6 MG CAPS 0.6 mg.  . ferrous sulfate 325 (65 FE) MG tablet Take 1 tablet (325 mg total) by mouth daily with breakfast.  . gabapentin (NEURONTIN) 100 MG capsule Take 1 capsule (100 mg total) by mouth at bedtime.  Marland Kitchen ibuprofen (ADVIL,MOTRIN) 800 MG tablet Take 1 tablet (800 mg total) by mouth every 8 (eight) hours as needed for headache.  . Vitamin D, Ergocalciferol, (DRISDOL) 50000 units CAPS capsule Take 1 capsule (50,000 Units total) by mouth every 7 (seven) days.     Allergies:   Clarithromycin; Doxycycline; and Sudafed [pseudoephedrine hcl]   Social History   Socioeconomic History  . Marital  status: Single    Spouse name: Not on file  . Number of children: Not on file  . Years of education: Not on file  . Highest education level: Not on file  Occupational History  . Not on file  Social Needs  . Financial resource strain: Not on file  . Food insecurity:    Worry: Not on file    Inability: Not on file  . Transportation needs:    Medical: Not on file    Non-medical: Not on file  Tobacco Use  . Smoking status: Never Smoker  . Smokeless tobacco: Never Used  Substance and Sexual Activity  . Alcohol use: No    Alcohol/week: 0.0 standard drinks  . Drug use: No  . Sexual activity: Never    Birth control/protection: Pill    Lifestyle  . Physical activity:    Days per week: Not on file    Minutes per session: Not on file  . Stress: Not on file  Relationships  . Social connections:    Talks on phone: Not on file    Gets together: Not on file    Attends religious service: Not on file    Active member of club or organization: Not on file    Attends meetings of clubs or organizations: Not on file    Relationship status: Not on file  Other Topics Concern  . Not on file  Social History Narrative  . Not on file     Family History: The patient's family history includes COPD in her maternal grandfather; Cancer in her maternal grandmother and mother; Diabetes in her maternal grandmother; Early death in her father; Heart disease in her maternal grandfather; Hypertension in her maternal grandmother and mother; Thyroid disease in her mother.  ROS:   Please see the history of present illness.    All other systems reviewed and are negative.  EKGs/Labs/Other Studies Reviewed:    The following studies were reviewed today: I discussed the findings of the echocardiogram and the MRI with the patient at extensive length and reassured her.  Both were unremarkable.  The MRI confirmed a normal aorta without any significant problems.   Recent Labs: 02/23/2018: ALT 10; BUN 13; Magnesium 2.1; Potassium 4.2; Sodium 141; TSH 1.26 03/08/2018: Hemoglobin 10.3; Platelets 343.0 04/21/2018: Creatinine, Ser 0.84  Recent Lipid Panel    Component Value Date/Time   CHOL 162 08/15/2014 0846   TRIG 50 08/15/2014 0846   HDL 64 08/15/2014 0846   CHOLHDL 2.5 08/15/2014 0846   VLDL 10 08/15/2014 0846   LDLCALC 88 08/15/2014 0846    Physical Exam:    VS:  BP 118/68 (BP Location: Right Arm, Patient Position: Sitting, Cuff Size: Normal)   Pulse 94   Ht 5\' 6"  (1.676 m)   Wt 195 lb (88.5 kg)   SpO2 98%   BMI 31.47 kg/m     Wt Readings from Last 3 Encounters:  05/19/18 195 lb (88.5 kg)  03/17/18 197 lb (89.4 kg)  03/08/18 196  lb (88.9 kg)     GEN: Patient is in no acute distress HEENT: Normal NECK: No JVD; No carotid bruits LYMPHATICS: No lymphadenopathy CARDIAC: Hear sounds regular, 2/6 systolic murmur at the apex. RESPIRATORY:  Clear to auscultation without rales, wheezing or rhonchi  ABDOMEN: Soft, non-tender, non-distended MUSCULOSKELETAL:  No edema; No deformity  SKIN: Warm and dry NEUROLOGIC:  Alert and oriented x 3 PSYCHIATRIC:  Normal affect   Signed, Jenean Lindau, MD  05/19/2018 9:47 AM  [facility]

## 2018-05-19 NOTE — Patient Instructions (Addendum)
Medication Instructions:  Your physician recommends that you continue on your current medications as directed. Please refer to the Current Medication list given to you today.  If you need a refill on your cardiac medications before your next appointment, please call your pharmacy.   Lab work: None  If you have labs (blood work) drawn today and your tests are completely normal, you will receive your results only by: Marland Kitchen MyChart Message (if you have MyChart) OR . A paper copy in the mail If you have any lab test that is abnormal or we need to change your treatment, we will call you to review the results.  Testing/Procedures: Your physician has requested that you have a stress echocardiogram. For further information please visit [email or web address]. Please follow instruction sheet as given.  Follow-Up: At [facility], you and your health needs are our priority.  As part of our continuing mission to provide you with exceptional heart care, we have created designated Provider Care Teams.  These Care Teams include your primary Cardiologist (physician) and Advanced Practice Providers (APPs -  Physician Assistants and Nurse Practitioners) who all work together to provide you with the care you need, when you need it.  You will need a follow up appointment in 6 months.  Please call our office 2 months in advance to schedule this appointment.  You may see another member of our [employer] Provider Team in [place]: Jenne Campus, MD . Shirlee More, MD  Any Other Special Instructions Will Be Listed Below (If Applicable).

## 2018-05-20 LAB — LIPID PANEL
CHOL/HDL RATIO: 3 ratio (ref 0.0–4.4)
CHOLESTEROL TOTAL: 155 mg/dL (ref 100–199)
HDL: 51 mg/dL (ref >39)
LDL CALC: 91 mg/dL (ref 0–99)
Triglycerides: 63 mg/dL (ref 0–149)
VLDL Cholesterol Cal: 13 mg/dL (ref 5–40)

## 2018-05-20 LAB — HEPATIC FUNCTION PANEL
ALT: 8 IU/L (ref 0–32)
AST: 14 IU/L (ref 0–40)
Albumin: 4 g/dL (ref 3.5–5.5)
Alkaline Phosphatase: 75 IU/L (ref 39–117)
BILIRUBIN, DIRECT: 0.08 mg/dL (ref 0.00–0.40)
Bilirubin Total: 0.3 mg/dL (ref 0.0–1.2)
TOTAL PROTEIN: 6.9 g/dL (ref 6.0–8.5)

## 2018-05-26 ENCOUNTER — Ambulatory Visit (INDEPENDENT_AMBULATORY_CARE_PROVIDER_SITE_OTHER): Payer: 59

## 2018-05-26 DIAGNOSIS — R079 Chest pain, unspecified: Secondary | ICD-10-CM

## 2018-05-26 NOTE — Progress Notes (Signed)
Stress echocardiogram has been performed.  Jimmy Aquila Menzie RDCS, RVT

## 2018-06-05 ENCOUNTER — Other Ambulatory Visit (HOSPITAL_BASED_OUTPATIENT_CLINIC_OR_DEPARTMENT_OTHER): Payer: 59

## 2018-06-29 MED FILL — GABAPENTIN 100 MG CAPSULE: 100 | 90 days supply | Qty: 90 | Fill #1

## 2018-07-07 ENCOUNTER — Telehealth: Payer: 59 | Admitting: Family

## 2018-07-07 DIAGNOSIS — N760 Acute vaginitis: Secondary | ICD-10-CM | POA: Diagnosis not present

## 2018-07-07 DIAGNOSIS — B9689 Other specified bacterial agents as the cause of diseases classified elsewhere: Secondary | ICD-10-CM | POA: Diagnosis not present

## 2018-07-07 MED ORDER — METRONIDAZOLE 500 MG PO TABS: 500.0000 mg | ORAL_TABLET | Freq: Two times a day (BID) | ORAL | 0 refills | Status: DC

## 2018-07-07 NOTE — Progress Notes (Signed)
Thank you for the details you included in the comment boxes. Those details are very helpful in determining the best course of treatment for you and help [place] to provide the best care. It is likely BV based on your symptoms.  We are sorry that you are not feeling well. Here is how we plan to help! Based on what you shared with me it looks like you: May have a vaginosis due to bacteria  Vaginosis is an inflammation of the vagina that can result in discharge, itching and pain. The cause is usually a change in the normal balance of vaginal bacteria or an infection. Vaginosis can also result from reduced estrogen levels after menopause.  The most common causes of vaginosis are:   Bacterial vaginosis which results from an overgrowth of one on several organisms that are normally present in your vagina.   Yeast infections which are caused by a naturally occurring fungus called candida.   Vaginal atrophy (atrophic vaginosis) which results from the thinning of the vagina from reduced estrogen levels after menopause.   Trichomoniasis which is caused by a parasite and is commonly transmitted by sexual intercourse.  Factors that increase your risk of developing vaginosis include: Marland Kitchen Medications, such as antibiotics and steroids . Uncontrolled diabetes . Use of hygiene products such as bubble bath, vaginal spray or vaginal deodorant . Douching . Wearing damp or tight-fitting clothing . Using an intrauterine device (IUD) for birth control . Hormonal changes, such as those associated with pregnancy, birth control pills or menopause . Sexual activity . Having a sexually transmitted infection  Your treatment plan is Metronidazole or Flagyl 500mg  twice a day for 7 days.  I have electronically sent this prescription into the pharmacy that you have chosen.  Be sure to take all of the medication as directed. Stop taking any medication if you develop a rash, tongue swelling or shortness of breath. Mothers who  are breast feeding should consider pumping and discarding their breast milk while on these antibiotics. However, there is no consensus that infant exposure at these doses would be harmful.  Remember that medication creams can weaken latex condoms. Marland Kitchen   HOME CARE:  Good hygiene may prevent some types of vaginosis from recurring and may relieve some symptoms:  . Avoid baths, hot tubs and whirlpool spas. Rinse soap from your outer genital area after a shower, and dry the area well to prevent irritation. Don't use scented or harsh soaps, such as those with deodorant or antibacterial action. Marland Kitchen Avoid irritants. These include scented tampons and pads. . Wipe from front to back after using the toilet. Doing so avoids spreading fecal bacteria to your vagina.  Other things that may help prevent vaginosis include:  Marland Kitchen Don't douche. Your vagina doesn't require cleansing other than normal bathing. Repetitive douching disrupts the normal organisms that reside in the vagina and can actually increase your risk of vaginal infection. Douching won't clear up a vaginal infection. . Use a latex condom. Both female and female latex condoms may help you avoid infections spread by sexual contact. . Wear cotton underwear. Also wear pantyhose with a cotton crotch. If you feel comfortable without it, skip wearing underwear to bed. Yeast thrives in [employer] Your symptoms should improve in the next day or two.  GET HELP RIGHT AWAY IF:  . You have pain in your lower abdomen ( pelvic area or over your ovaries) . You develop nausea or vomiting . You develop a fever . Your discharge changes  or worsens . You have persistent pain with intercourse . You develop shortness of breath, a rapid pulse, or you faint.  These symptoms could be signs of problems or infections that need to be evaluated by a medical provider now.  MAKE SURE YOU    Understand these instructions.  Will watch your condition.  Will get help  right away if you are not doing well or get worse.  Your e-visit answers were reviewed by a board certified advanced clinical practitioner to complete your personal care plan. Depending upon the condition, your plan could have included both over the counter or prescription medications. Please review your pharmacy choice to make sure that you have choses a pharmacy that is open for you to pick up any needed prescription, Your safety is important to [place]. If you have drug allergies check your prescription carefully.   You can use MyChart to ask questions about today's visit, request a non-urgent call back, or ask for a work or school excuse for 24 hours related to this e-Visit. If it has been greater than 24 hours you will need to follow up with your provider, or enter a new e-Visit to address those concerns. You will get a MyChart message within the next two days asking about your experience. I hope that your e-visit has been valuable and will speed your recovery.

## 2018-07-14 ENCOUNTER — Telehealth: Payer: 59 | Admitting: Family

## 2018-07-14 DIAGNOSIS — B3731 Acute candidiasis of vulva and vagina: Secondary | ICD-10-CM

## 2018-07-14 DIAGNOSIS — B373 Candidiasis of vulva and vagina: Secondary | ICD-10-CM | POA: Diagnosis not present

## 2018-07-14 MED ORDER — FLUCONAZOLE 150 MG PO TABS: 150.0000 mg | ORAL_TABLET | Freq: Once | ORAL | 0 refills | Status: AC

## 2018-07-14 NOTE — Progress Notes (Signed)

## 2018-08-02 DIAGNOSIS — I8311 Varicose veins of right lower extremity with inflammation: Secondary | ICD-10-CM | POA: Diagnosis not present

## 2018-08-02 DIAGNOSIS — I8312 Varicose veins of left lower extremity with inflammation: Secondary | ICD-10-CM | POA: Diagnosis not present

## 2018-08-02 DIAGNOSIS — L819 Disorder of pigmentation, unspecified: Secondary | ICD-10-CM | POA: Diagnosis not present

## 2018-08-02 DIAGNOSIS — L52 Erythema nodosum: Secondary | ICD-10-CM | POA: Diagnosis not present

## 2018-08-02 DIAGNOSIS — R888 Abnormal findings in other body fluids and substances: Secondary | ICD-10-CM | POA: Diagnosis not present

## 2018-08-02 DIAGNOSIS — Z79899 Other long term (current) drug therapy: Secondary | ICD-10-CM | POA: Diagnosis not present

## 2018-08-02 DIAGNOSIS — D649 Anemia, unspecified: Secondary | ICD-10-CM | POA: Diagnosis not present

## 2018-08-02 DIAGNOSIS — L810 Postinflammatory hyperpigmentation: Secondary | ICD-10-CM | POA: Diagnosis not present

## 2018-08-02 MED FILL — HYDROQUINONE 4% CREAM: 4 | 28 days supply | Qty: 28 | Fill #0

## 2018-08-03 MED FILL — FERREX 150 CAPSULE: 150 | 90 days supply | Qty: 90 | Fill #0

## 2018-08-22 ENCOUNTER — Ambulatory Visit (HOSPITAL_BASED_OUTPATIENT_CLINIC_OR_DEPARTMENT_OTHER)
Admission: RE | Admit: 2018-08-22 | Discharge: 2018-08-22 | Disposition: A | Discharge status: Home / Self Care | Payer: 59 | Source: Ambulatory Visit | Attending: Obstetrics and Gynecology | Admitting: Obstetrics and Gynecology

## 2018-08-22 ENCOUNTER — Other Ambulatory Visit (HOSPITAL_BASED_OUTPATIENT_CLINIC_OR_DEPARTMENT_OTHER): Payer: Self-pay | Admitting: Obstetrics and Gynecology

## 2018-08-22 DIAGNOSIS — Z1239 Encounter for other screening for malignant neoplasm of breast: Secondary | ICD-10-CM

## 2018-08-22 DIAGNOSIS — Z1231 Encounter for screening mammogram for malignant neoplasm of breast: Secondary | ICD-10-CM | POA: Insufficient documentation

## 2018-08-30 MED FILL — HYDROQUINONE 4 % CREA: 4 | 28 days supply | Qty: 28 | Fill #1

## 2018-09-07 MED FILL — COLCHICINE 0.6 MG CAPSULE: 0.6 | 30 days supply | Qty: 60 | Fill #1

## 2018-09-12 ENCOUNTER — Telehealth: Payer: 59 | Admitting: Physician Assistant

## 2018-09-12 DIAGNOSIS — N898 Other specified noninflammatory disorders of vagina: Secondary | ICD-10-CM

## 2018-09-12 NOTE — Progress Notes (Signed)
Based on what you shared with me, I feel your condition warrants further evaluation and I recommend that you be seen for a face to face office visit. My biggest concern is your complaint of abdominal pain in the setting of what seems like a yeast infection.     NOTE: If you entered your credit card information for this eVisit, you will not be charged. You may see a "hold" on your card for the $30 but that hold will drop off and you will not have a charge processed.  If you are having a true medical emergency please call 911.  If you need an urgent face to face visit, [facility] has four urgent care centers for your convenience.  If you need care fast and have a high deductible or no insurance consider:   [email or web address] to reserve your spot online an avoid wait times  [facility] [address] [place], [place] [address] 8 am to 8 pm Monday-Friday 10 am to 4 pm Saturday-Sunday *Across the street from [employer]  [place], [address] 8 am to 5 pm Monday-Friday * In the [facility] on the [facility]   The following sites will take your insurance:  . [facility] [place] a Provider at this Location  [address] [place], [place] [address] . 10 am to 8 pm Monday-Friday . 12 pm to 8 pm Saturday-Sunday   . [facility] at [place] a Provider at this Location  [address] [place], [place], [place] [address] . 8 am to 8 pm Monday-Friday . 9 am to 6 pm Saturday . 11 am to 6 pm Sunday   . [facility] at [place] Get Driving Directions  [address] [place], [place] [address] . 8 am to 8 pm Monday-Friday . 8 am to 4 pm Saturday-Sunday   Your e-visit answers were reviewed by a board certified advanced clinical  practitioner to complete your personal care plan.  Thank you for using e-Visits.

## 2018-09-14 DIAGNOSIS — B373 Candidiasis of vulva and vagina: Secondary | ICD-10-CM | POA: Diagnosis not present

## 2018-09-14 DIAGNOSIS — Z113 Encounter for screening for infections with a predominantly sexual mode of transmission: Secondary | ICD-10-CM | POA: Diagnosis not present

## 2018-09-27 ENCOUNTER — Encounter: Payer: Self-pay | Admitting: Medical

## 2018-09-29 ENCOUNTER — Ambulatory Visit (INDEPENDENT_AMBULATORY_CARE_PROVIDER_SITE_OTHER): Payer: 59 | Admitting: Medical

## 2018-09-29 ENCOUNTER — Other Ambulatory Visit: Payer: Self-pay

## 2018-09-29 ENCOUNTER — Encounter: Payer: Self-pay | Admitting: Medical

## 2018-09-29 DIAGNOSIS — F419 Anxiety disorder, unspecified: Secondary | ICD-10-CM | POA: Diagnosis not present

## 2018-09-29 DIAGNOSIS — R0789 Other chest pain: Secondary | ICD-10-CM | POA: Diagnosis not present

## 2018-09-29 MED ORDER — ALPRAZOLAM 0.5 MG PO TABS: 0.5000 mg | ORAL_TABLET | Freq: Two times a day (BID) | ORAL | 0 refills | Status: DC | PRN

## 2018-09-29 MED FILL — ALPRAZolam 0.5 MG TABS: 0.5 | 10 days supply | Qty: 20 | Fill #0

## 2018-09-29 NOTE — Patient Instructions (Signed)
Patient appears to have atypical chest pressure intermittently of short duration that appears to be stress/anxiety related.  I did review her prior cardiac work-up results with her today.  Those studies were negative.  She does not have history of high cholesterol and she does not smoke.  These transient pressure events occur mostly under stress which considering that she may be exposed to covid infected this appears to be a reasonable level anxiety and stress.  I did prescribe Xanax today and we discussed how to use the medication.  Rx advisement given regarding sedation particularly.  We had discussion about steps to take if her symptoms worsen or change.  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  Also she is aware that she should be evaluated for severe symptoms even if she is at home with any worsening or changing symptoms.  Note patient does not report any associated cardiac type signs symptoms with this faint transient pressure events.  Patient will start medication today and she will update me on Monday if it has impact on the transient chest pressure events.  Follow-up date to be determined.

## 2018-09-29 NOTE — Progress Notes (Signed)
Subjective:    Patient ID: Nicole Donaldson, female    DOB: 1976/02/05, 43 y.o.   MRN: [id number]  HPI   Virtual Visit via Video Note  I connected with Nicole Donaldson on 09/29/18 at 11:00 AM EDT by a video enabled telemedicine application and verified that I am speaking with the correct person using two identifiers.   I discussed the limitations of evaluation and management by telemedicine and the availability of in person appointments. The patient expressed understanding and agreed to proceed.  History of Present Illness: Pt states she is having some faint chest pressure in her chest intermittently. This happens mostly at work. Seems to occur with changing policies on contact precautions guidelines. 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 The pressure occurs 5-15 minutes. No other cardiac type signs or symptoms. Usually after xray symptoms resolves.   At home will get some chest pressure with low level stresses at home.  Pt states no severe anxiety or depression.  Pt tried some lorazepam briefly for insomnia in past.  Pt saw Dr. Cherrie Gauze mid november. Stress echo was negative. Thn over past month symptoms seem to be reoccuring.  Pt had last felt the sensation last night at work.   Observations/Objective: Appears in good mood presently. No acute distress  Assessment and Plan: Patient appears to have atypical chest pressure intermittently of short duration that appears to be stress/anxiety related.  I did review her prior cardiac work-up results with her today.  Those studies were negative.  She does not have history of high cholesterol and she does not smoke.  These transient pressure events occur mostly under stress which considering that she may be exposed to covid infected this appears to be a reasonable level anxiety and stress.  I did prescribe Xanax today and we discussed how to use the medication.  Rx advisement given regarding sedation particularly.  We  had discussion about steps to take if her symptoms worsen or change.  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  Also she is aware that she should be evaluated for severe symptoms even if she is at home with any worsening or changing symptoms.  Note patient does not report any associated cardiac type signs symptoms with this faint transient pressure events.  Patient will start medication today and she will update me on Monday if it has impact on the transient chest pressure events.  Follow-up date to be determined.  Follow Up Instructions:    I discussed the assessment and treatment plan with the patient. The patient was provided an opportunity to ask questions and all were answered. The patient agreed with the plan and demonstrated an understanding of the instructions.   The patient was advised to call back or seek an in-person evaluation if the symptoms worsen or if the condition fails to improve as anticipated.  I provided 25 minutes of non-face-to-face time during this encounter.   Mackie Pai, PA-C      Review of Systems  Constitutional: Negative for chills, fatigue and fever.  Respiratory: Negative for cough, chest tightness, shortness of breath and wheezing.   Cardiovascular: Negative for chest pain and palpitations.       Atypical intermittent faint chest pressure.   Gastrointestinal: Negative for abdominal pain.  Musculoskeletal: Negative for back pain.  Skin: Negative for pallor and rash.  Neurological: Negative for dizziness, seizures and headaches.  Hematological: Negative for adenopathy. Does not bruise/bleed easily.  Psychiatric/Behavioral: Negative for behavioral problems, sleep disturbance and suicidal ideas. The patient is nervous/anxious.        Objective:   Physical Exam  NA.     Assessment & Plan:

## 2018-10-20 ENCOUNTER — Encounter: Payer: Self-pay | Admitting: Medical

## 2018-10-27 ENCOUNTER — Other Ambulatory Visit: Payer: 59

## 2018-10-27 ENCOUNTER — Other Ambulatory Visit: Payer: Self-pay

## 2018-10-27 DIAGNOSIS — Z79899 Other long term (current) drug therapy: Secondary | ICD-10-CM

## 2018-10-27 NOTE — Telephone Encounter (Signed)
Nicole Donaldson -- pt states she will need a new RX with above directions, quantity.

## 2018-10-28 ENCOUNTER — Other Ambulatory Visit: Payer: Self-pay | Admitting: Medical

## 2018-10-30 ENCOUNTER — Encounter: Payer: Self-pay | Admitting: Medical

## 2018-10-30 LAB — PAIN MGMT, PROFILE 8 W/CONF, U
6 Acetylmorphine: NEGATIVE ng/mL
Alcohol Metabolites: NEGATIVE ng/mL (ref <500)
Alphahydroxyalprazolam: 81 ng/mL
Alphahydroxymidazolam: NEGATIVE ng/mL
Alphahydroxytriazolam: NEGATIVE ng/mL
Aminoclonazepam: NEGATIVE ng/mL
Amphetamines: NEGATIVE ng/mL
Benzodiazepines: POSITIVE ng/mL
Buprenorphine, Urine: NEGATIVE ng/mL
Cocaine Metabolite: NEGATIVE ng/mL
Creatinine: 234 mg/dL
Hydroxyethylflurazepam: NEGATIVE ng/mL
Lorazepam: NEGATIVE ng/mL
MDMA: NEGATIVE ng/mL
Marijuana Metabolite: NEGATIVE ng/mL
Nordiazepam: NEGATIVE ng/mL
Opiates: NEGATIVE ng/mL
Oxazepam: NEGATIVE ng/mL
Oxidant: NEGATIVE ug/mL
Oxycodone: NEGATIVE ng/mL
Temazepam: NEGATIVE ng/mL
pH: 5.8 (ref 4.5–9.0)

## 2018-10-30 NOTE — Telephone Encounter (Signed)
Refill Request: Alprazolam   Last RX:09/26/18 Last OV:09/28/28 Next OV: None scheduled  UDS:08/28/18 CSC:10/27/18 CSR:

## 2018-10-30 NOTE — Telephone Encounter (Signed)
Rx xanax sent to pt pharmacy.

## 2018-11-01 ENCOUNTER — Telehealth: Payer: Self-pay

## 2018-11-01 MED ORDER — ALPRAZOLAM 1 MG PO TABS: 1.0000 mg | ORAL_TABLET | Freq: Two times a day (BID) | ORAL | 0 refills | Status: DC | PRN

## 2018-11-01 NOTE — Telephone Encounter (Signed)
Sent in 1 mg rx. Will you call downstairs and notify pharmacy to cancel 0.5 mg rx and only fill 1 mg rx.

## 2018-11-01 NOTE — Telephone Encounter (Signed)
Copied from [place] [phone]. Topic: General - Inquiry >> Nov 01, 2018 12:44 PM Richardo Priest, NT wrote: Reason for CRM: Patient called in stating she should have 1mg  of xanax instead of her .5mg  that was sent in. Requesting the 1mg  be sent in ASAP due to her going without it for 5 days.

## 2018-11-02 MED FILL — ALPRAZolam 1 MG TABS: 1 | 10 days supply | Qty: 20 | Fill #0

## 2018-11-02 NOTE — Telephone Encounter (Signed)
Pt notified correct rx sent to pharmacy.

## 2018-12-20 ENCOUNTER — Other Ambulatory Visit: Payer: Self-pay | Admitting: Medical

## 2018-12-21 ENCOUNTER — Encounter: Payer: Self-pay | Admitting: Medical

## 2018-12-21 ENCOUNTER — Other Ambulatory Visit: Payer: Self-pay | Admitting: Medical

## 2018-12-21 MED FILL — ALPRAZolam 1 MG TABS: 1 | 10 days supply | Qty: 20 | Fill #0

## 2018-12-21 NOTE — Telephone Encounter (Signed)
Rx xanax. Sent to pt pharmacy.

## 2018-12-21 NOTE — Telephone Encounter (Addendum)
Refill Request: Alprazolam   Last RX:11/01/18 Last OV:09/29/18 Next QQ:UIVH scheduled  UDS:10/27/18 CSC:10/27/18 CSR:12/21/2018

## 2018-12-22 NOTE — Telephone Encounter (Signed)
Rx sent last night.

## 2019-01-03 DIAGNOSIS — L810 Postinflammatory hyperpigmentation: Secondary | ICD-10-CM | POA: Diagnosis not present

## 2019-01-03 DIAGNOSIS — I8312 Varicose veins of left lower extremity with inflammation: Secondary | ICD-10-CM | POA: Diagnosis not present

## 2019-01-03 DIAGNOSIS — Z79899 Other long term (current) drug therapy: Secondary | ICD-10-CM | POA: Diagnosis not present

## 2019-01-03 DIAGNOSIS — L304 Erythema intertrigo: Secondary | ICD-10-CM | POA: Insufficient documentation

## 2019-01-03 DIAGNOSIS — D500 Iron deficiency anemia secondary to blood loss (chronic): Secondary | ICD-10-CM | POA: Diagnosis not present

## 2019-01-03 DIAGNOSIS — L52 Erythema nodosum: Secondary | ICD-10-CM | POA: Diagnosis not present

## 2019-01-03 DIAGNOSIS — D649 Anemia, unspecified: Secondary | ICD-10-CM | POA: Diagnosis not present

## 2019-01-03 DIAGNOSIS — I8311 Varicose veins of right lower extremity with inflammation: Secondary | ICD-10-CM | POA: Diagnosis not present

## 2019-01-03 DIAGNOSIS — L819 Disorder of pigmentation, unspecified: Secondary | ICD-10-CM | POA: Diagnosis not present

## 2019-01-03 MED FILL — NYSTATIN 100,000 UNIT/GM CR: 100000 | 15 days supply | Qty: 30 | Fill #0

## 2019-02-01 DIAGNOSIS — Z30011 Encounter for initial prescription of contraceptive pills: Secondary | ICD-10-CM | POA: Diagnosis not present

## 2019-02-01 DIAGNOSIS — Z113 Encounter for screening for infections with a predominantly sexual mode of transmission: Secondary | ICD-10-CM | POA: Diagnosis not present

## 2019-02-01 DIAGNOSIS — Z114 Encounter for screening for human immunodeficiency virus [HIV]: Secondary | ICD-10-CM | POA: Diagnosis not present

## 2019-02-13 MED FILL — HYDROQUINONE 4 % CREA: 4 | 28 days supply | Qty: 28 | Fill #2

## 2019-02-22 ENCOUNTER — Telehealth: Payer: 59 | Admitting: Physician Assistant

## 2019-02-22 DIAGNOSIS — B373 Candidiasis of vulva and vagina: Secondary | ICD-10-CM | POA: Diagnosis not present

## 2019-02-22 DIAGNOSIS — B3731 Acute candidiasis of vulva and vagina: Secondary | ICD-10-CM

## 2019-02-22 MED ORDER — FLUCONAZOLE 150 MG PO TABS: 150.0000 mg | ORAL_TABLET | Freq: Once | ORAL | 0 refills | Status: AC

## 2019-02-22 MED FILL — FLUCONAZOLE 150 MG TABS: 150 | 1 days supply | Qty: 1 | Fill #0

## 2019-02-22 NOTE — Progress Notes (Signed)

## 2019-02-22 NOTE — Progress Notes (Signed)
I have spent 5 minutes in review of e-visit questionnaire, review and updating patient chart, medical decision making and response to patient.   Elyzabeth Goatley Cody Clara Herbison, PA-C    

## 2019-02-22 NOTE — Progress Notes (Signed)
Message sent to patient requesting further input regarding current symptoms. Awaiting patient response.  

## 2019-03-09 DIAGNOSIS — Z01419 Encounter for gynecological examination (general) (routine) without abnormal findings: Secondary | ICD-10-CM | POA: Diagnosis not present

## 2019-03-09 DIAGNOSIS — Z6833 Body mass index (BMI) 33.0-33.9, adult: Secondary | ICD-10-CM | POA: Diagnosis not present

## 2019-03-09 DIAGNOSIS — N898 Other specified noninflammatory disorders of vagina: Secondary | ICD-10-CM | POA: Diagnosis not present

## 2019-03-09 DIAGNOSIS — Z1151 Encounter for screening for human papillomavirus (HPV): Secondary | ICD-10-CM | POA: Diagnosis not present

## 2019-03-09 DIAGNOSIS — Z118 Encounter for screening for other infectious and parasitic diseases: Secondary | ICD-10-CM | POA: Diagnosis not present

## 2019-04-03 ENCOUNTER — Encounter: Payer: Self-pay | Admitting: Medical

## 2019-04-04 ENCOUNTER — Telehealth: Payer: Self-pay | Admitting: Medical

## 2019-04-04 MED ORDER — ALPRAZOLAM 1 MG PO TABS: 1.0000 mg | ORAL_TABLET | Freq: Two times a day (BID) | ORAL | 0 refills | Status: DC | PRN

## 2019-04-04 NOTE — Telephone Encounter (Signed)
Rx refill xanax sent to pharmacy. ?

## 2019-04-04 NOTE — Telephone Encounter (Signed)
Last Xanax RX:  12/21/18, #20 Last OV: 09/29/18 Next OV: NO return date UDS: 10/27/18 CSC: 10/27/18

## 2019-04-05 MED FILL — ALPRAZolam 1 MG TABS: 1 | 10 days supply | Qty: 20 | Fill #0

## 2019-04-09 MED FILL — HYDROQUINONE 4% CREAM: 4 | 28 days supply | Qty: 28 | Fill #3

## 2019-06-24 IMAGING — US US EXTREM LOW VENOUS*L*
1 series · 13 of 24 positions shown · non-contrast
Comparison: None.

CLINICAL DATA: Left lower extremity pain and edema.



[Series 1: us extrem low venous*left* · 0.08mm/px · 13 of 31 slices shown]
[im 1/31]
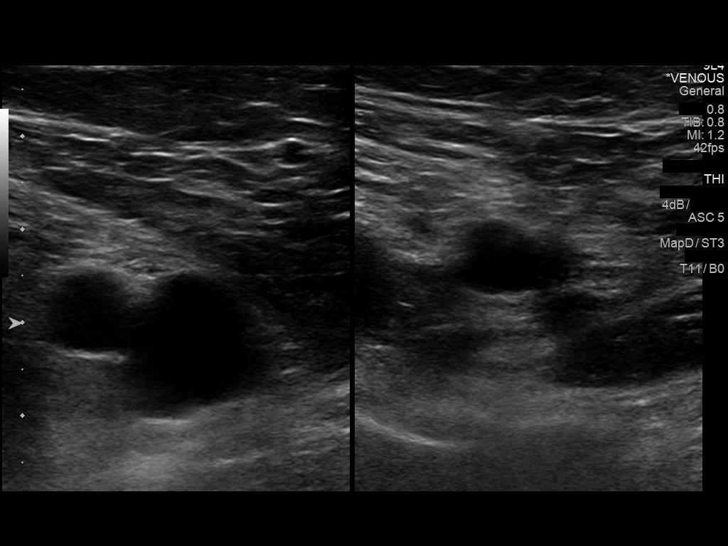
[im 3/31]
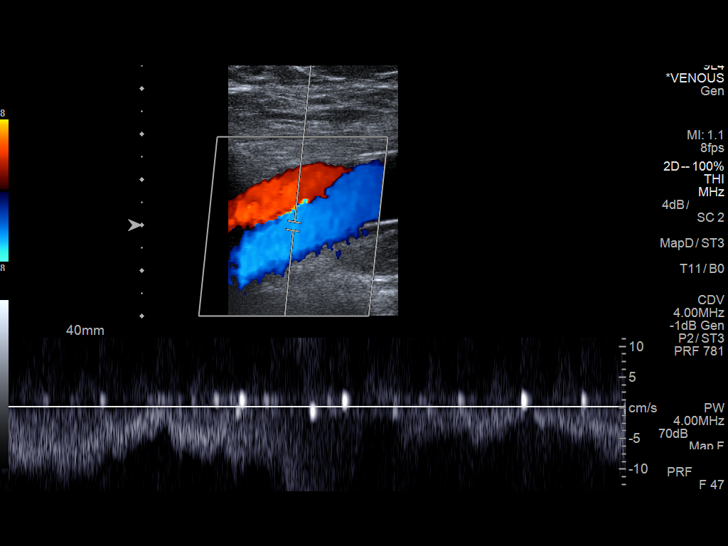
[im 6/31]
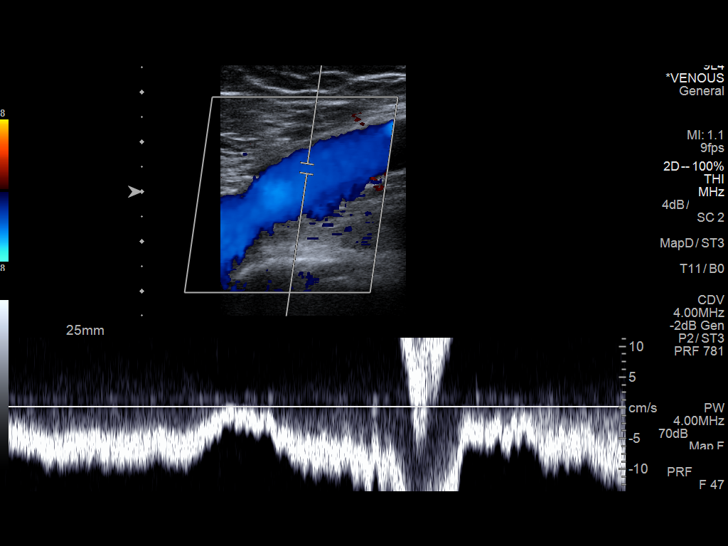
[im 8/31]
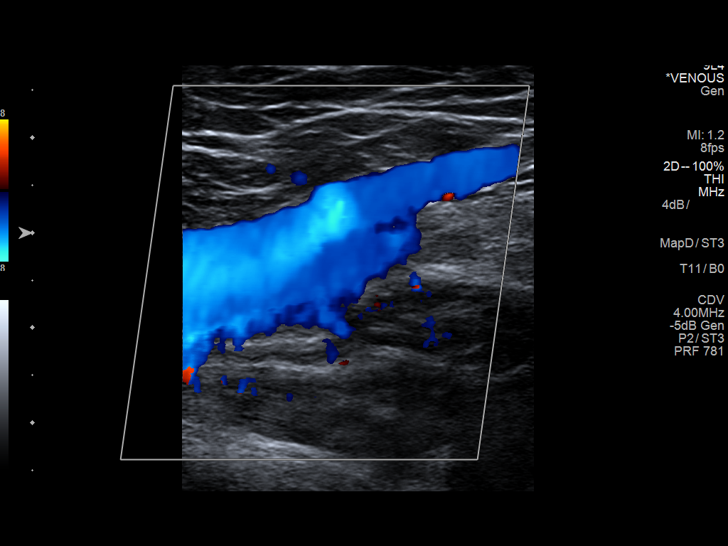
[im 11/31]
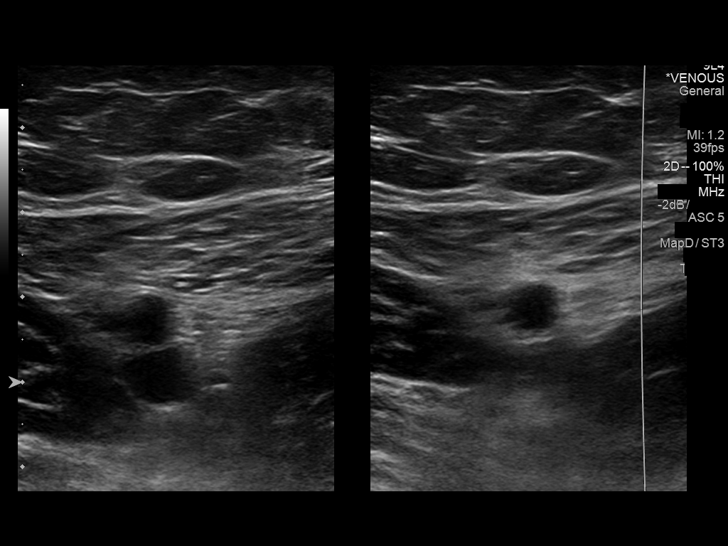
[im 14/31]
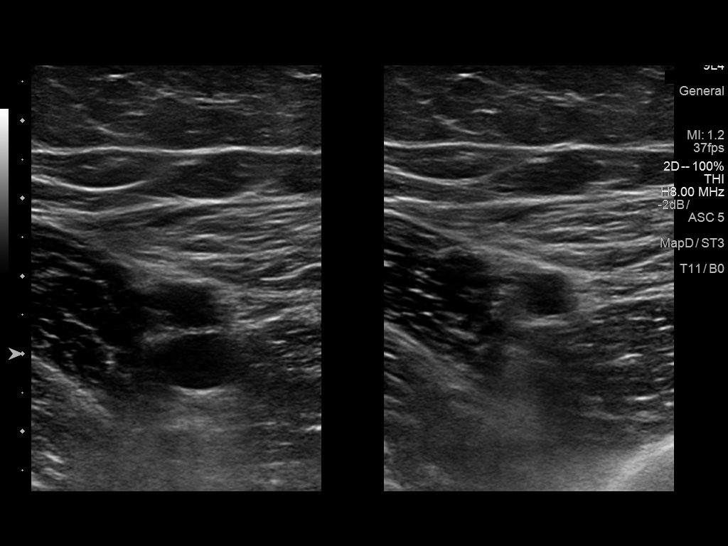
[im 16/31]
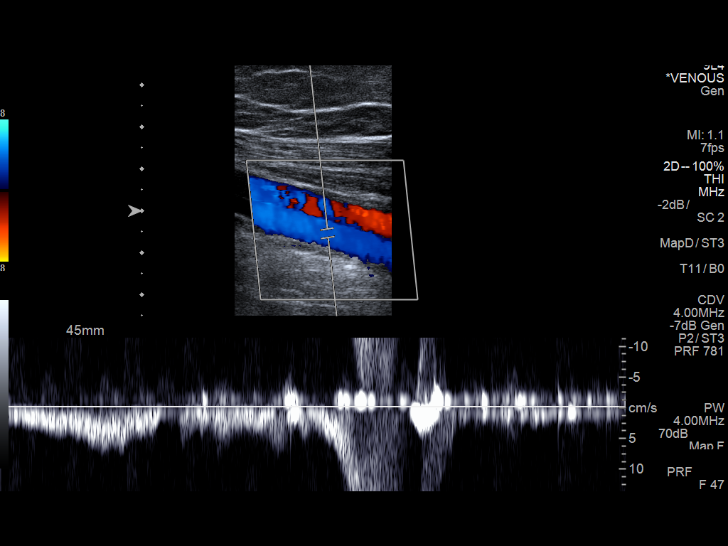
[im 17/31]
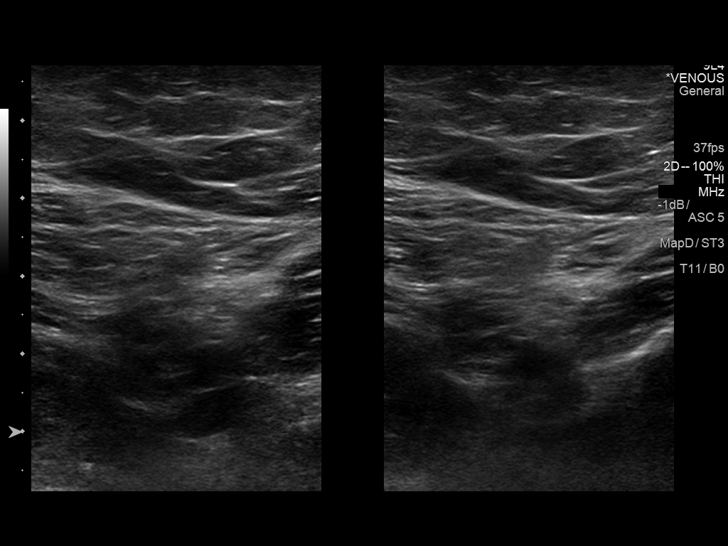
[im 20/31]
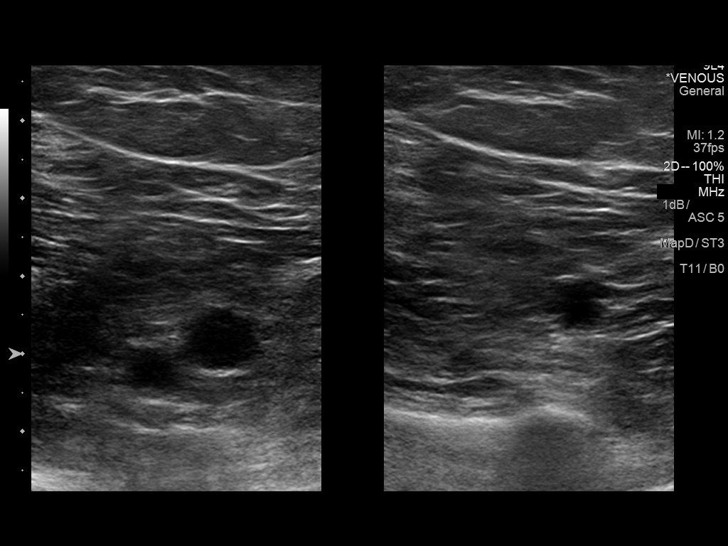
[im 23/31]
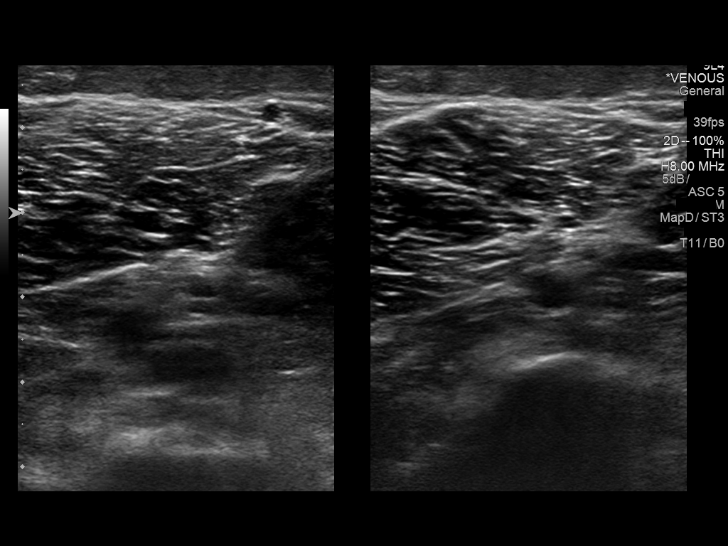
[im 25/31]
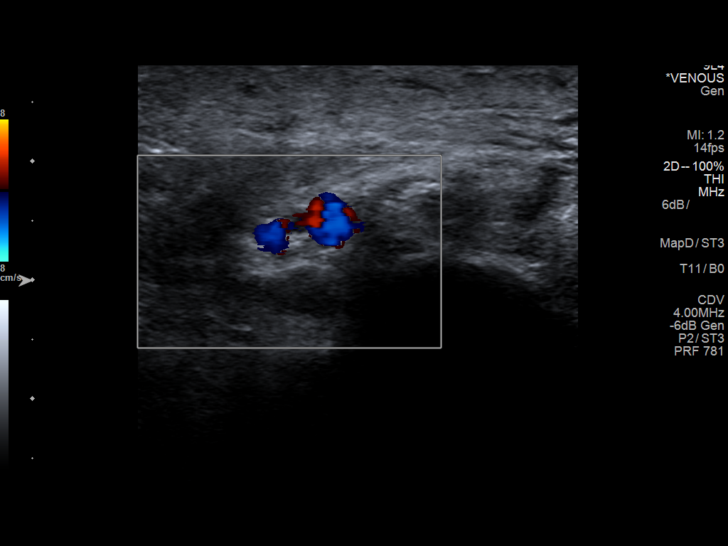
[im 28/31]
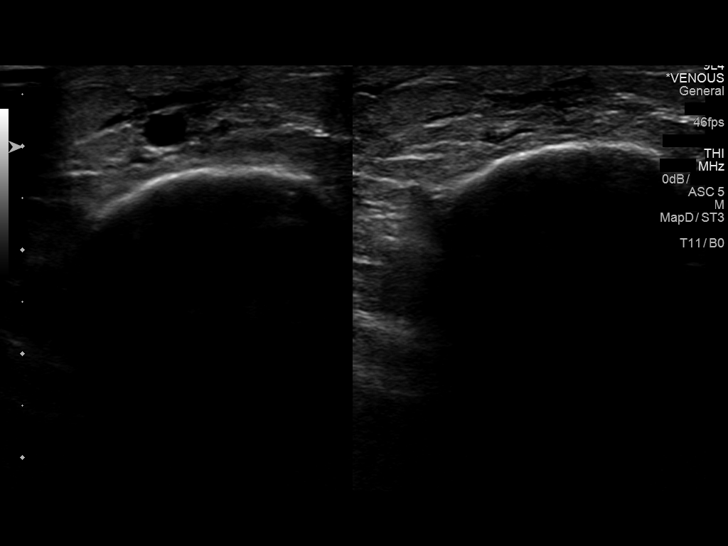
[im 31/31]
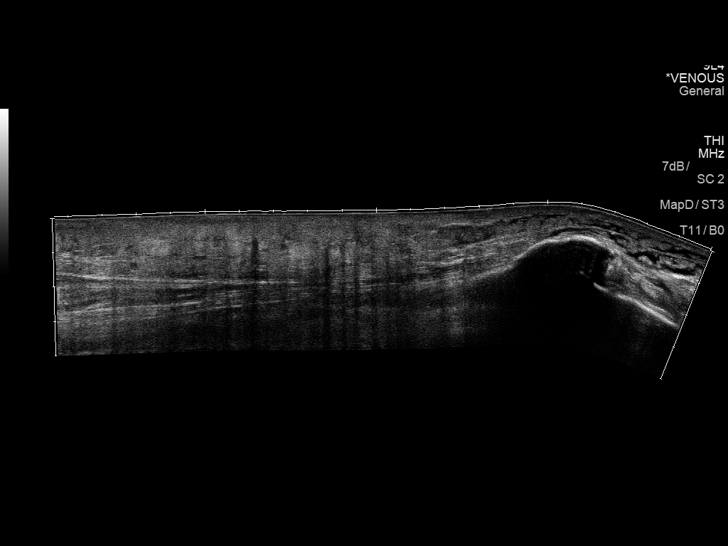

[13 of 24 positions shown; findings below may reference images not displayed]

FINDINGS: Contralateral Common Femoral Vein: Respiratory phasicity is normal
and symmetric with the symptomatic side. No evidence of thrombus.
Normal compressibility.

Common Femoral Vein: No evidence of thrombus. Normal
compressibility, respiratory phasicity and response to augmentation.

Saphenofemoral Junction: No evidence of thrombus. Normal
compressibility and flow on color Doppler imaging.

Profunda Femoral Vein: No evidence of thrombus. Normal
compressibility and flow on color Doppler imaging.

Femoral Vein: No evidence of thrombus. Normal compressibility,
respiratory phasicity and response to augmentation.

Popliteal Vein: No evidence of thrombus. Normal compressibility,
respiratory phasicity and response to augmentation.

Calf Veins: No evidence of thrombus. Normal compressibility and flow
on color Doppler imaging.

Superficial Great Saphenous Vein: No evidence of thrombus. Normal
compressibility.

Venous Reflux:  None.

Other Findings: No evidence of superficial thrombophlebitis or
abnormal fluid collection.
IMPRESSION: No evidence of left lower extremity deep venous thrombosis.

## 2019-07-09 DIAGNOSIS — L52 Erythema nodosum: Secondary | ICD-10-CM | POA: Diagnosis not present

## 2019-07-09 DIAGNOSIS — I776 Arteritis, unspecified: Secondary | ICD-10-CM | POA: Diagnosis not present

## 2019-07-09 DIAGNOSIS — Z79899 Other long term (current) drug therapy: Secondary | ICD-10-CM | POA: Diagnosis not present

## 2019-07-09 DIAGNOSIS — L819 Disorder of pigmentation, unspecified: Secondary | ICD-10-CM | POA: Diagnosis not present

## 2019-07-09 DIAGNOSIS — D649 Anemia, unspecified: Secondary | ICD-10-CM | POA: Diagnosis not present

## 2019-07-09 DIAGNOSIS — Z5181 Encounter for therapeutic drug level monitoring: Secondary | ICD-10-CM | POA: Diagnosis not present

## 2019-07-09 MED FILL — COLCHICINE 0.6 MG CAPSULE: 0.6 | 30 days supply | Qty: 60 | Fill #0

## 2019-07-09 MED FILL — HYDROQUINONE 4% CREAM: 4 | 14 days supply | Qty: 28 | Fill #0

## 2019-07-09 MED FILL — TRIAMCINOLONE 0.1% CREAM: 0.1 | 30 days supply | Qty: 454 | Fill #0

## 2019-07-24 ENCOUNTER — Telehealth: Payer: 59 | Admitting: Physician Assistant

## 2019-07-24 DIAGNOSIS — N760 Acute vaginitis: Secondary | ICD-10-CM

## 2019-07-24 MED ORDER — METRONIDAZOLE 500 MG PO TABS: 500.0000 mg | ORAL_TABLET | Freq: Three times a day (TID) | ORAL | 0 refills | Status: DC

## 2019-07-24 NOTE — Progress Notes (Signed)

## 2019-07-25 ENCOUNTER — Encounter: Payer: Self-pay | Admitting: Cardiology

## 2019-07-25 ENCOUNTER — Ambulatory Visit (INDEPENDENT_AMBULATORY_CARE_PROVIDER_SITE_OTHER): Payer: 59 | Admitting: Cardiology

## 2019-07-25 ENCOUNTER — Other Ambulatory Visit: Payer: Self-pay

## 2019-07-25 VITALS — BP 118/78 | HR 92 | Ht 66.0 in | Wt 193.0 lb

## 2019-07-25 DIAGNOSIS — Z13220 Encounter for screening for lipoid disorders: Secondary | ICD-10-CM

## 2019-07-25 DIAGNOSIS — R0789 Other chest pain: Secondary | ICD-10-CM | POA: Diagnosis not present

## 2019-07-25 DIAGNOSIS — R931 Abnormal findings on diagnostic imaging of heart and coronary circulation: Secondary | ICD-10-CM

## 2019-07-25 DIAGNOSIS — L52 Erythema nodosum: Secondary | ICD-10-CM | POA: Diagnosis not present

## 2019-07-25 DIAGNOSIS — Z1329 Encounter for screening for other suspected endocrine disorder: Secondary | ICD-10-CM

## 2019-07-25 MED FILL — METRONIDAZOLE 500 MG TABS: 500 | 7 days supply | Qty: 21 | Fill #0

## 2019-07-25 NOTE — Patient Instructions (Signed)
Medication Instructions:  Your physician recommends that you continue on your current medications as directed. Please refer to the Current Medication list given to you today. *If you need a refill on your cardiac medications before your next appointment, please call your pharmacy*  Lab Work: Your physician recommends that you return FASTING for a BMP, TSH, hepatic and lipid to be drawn  If you have labs (blood work) drawn today and your tests are completely normal, you will receive your results only by: Marland Kitchen MyChart Message (if you have MyChart) OR . A paper copy in the mail If you have any lab test that is abnormal or we need to change your treatment, we will call you to review the results.  Testing/Procedures: You had an EKG performed today  Follow-Up: At [facility], you and your health needs are our priority.  As part of our continuing mission to provide you with exceptional heart care, we have created designated Provider Care Teams.  These Care Teams include your primary Cardiologist (physician) and Advanced Practice Providers (APPs -  Physician Assistants and Nurse Practitioners) who all work together to provide you with the care you need, when you need it.  Your next appointment:   6 month(s)  The format for your next appointment:   In Person  Provider:   Jyl Heinz, MD

## 2019-07-25 NOTE — Progress Notes (Signed)
Cardiology Office Note:    Date:  07/25/2019   ID:  Nicole Donaldson, DOB 09/08/1975, MRN [id number]  PCP:  Mackie Pai, PA-C  Cardiologist:  Jenean Lindau, MD   Referring MD: Mackie Pai, PA-C    ASSESSMENT:    1. Abnormal echocardiogram   2. Screening cholesterol level   3. Thyroid disorder screen   4. Chest discomfort   5. Erythema nodosum    PLAN:    In order of problems listed above:  1. Chest discomfort and chest tightness: She mentions to me that the symptoms have disappeared and resolved.  She feels that because of the COVID-19 she underwent a lot of stress at work exposed to people with her medical condition but she has gotten used to that and takes good precautions now and is not worried about it much.  Lifestyle modification also has helped her. 2. For his stratification she would like to get blood work in the next few days and I will get her in for liver lipid check amongst other labs.  Primary prevention stressed.  Weight reduction stressed risks of being overweight explained and she vocalized understanding.Patient will be seen in follow-up appointment in 6 months or earlier if the patient has any concerns    Medication Adjustments/Labs and Tests Ordered: Current medicines are reviewed at length with the patient today.  Concerns regarding medicines are outlined above.  Orders Placed This Encounter  Procedures  . Basic Metabolic Panel (BMET)  . TSH  . Hepatic function panel  . Lipid Profile  . EKG 12-Lead   No orders of the defined types were placed in this encounter.    Chief Complaint  Patient presents with  . Follow-up    6 MO FU      History of Present Illness:    Nicole Donaldson is a 44 y.o. female.  Patient has past medical history of erythema nodosum on colchicine therapy.  She denies any problems at this time and takes care of activities of daily living.  No chest pain orthopnea or PND.  Last time her echocardiogram revealed abnormalities in  the aortic arch and therefore further evaluation was done with a cardiac MRI and this was unremarkable.  Patient feels fine now.  She denies any chest pain orthopnea or PND.  At the time of my evaluation, the patient is alert awake oriented and in no distress.  Past Medical History:  Diagnosis Date  . Anemia   . Asthma    childhood exercise induced  . Enlarged thyroid    during pregnancy  . Gestational diabetes    diet controlled  . Headache    Migraines  . History of blood transfusion   . History of hiatal hernia   . No pertinent past medical history   . Pinched nerve in neck     Past Surgical History:  Procedure Laterality Date  . COLONOSCOPY    . DILATATION & CURETTAGE/HYSTEROSCOPY WITH MYOSURE N/A 03/19/2016   Procedure: DILATATION & CURETTAGE/HYSTEROSCOPY WITH MYOSURE;  Surgeon: Servando Salina, MD;  Location: [facility];  Service: Gynecology;  Laterality: N/A;  . NO PAST SURGERIES    . UPPER GI ENDOSCOPY      Current Medications: Current Meds  Medication Sig  . ALPRAZolam (XANAX) 1 MG tablet Take 1 tablet (1 mg total) by mouth 2 (two) times daily as needed. for anxiety  . Aspirin-Salicylamide-Caffeine (BC HEADACHE POWDER PO) Take 1 packet by mouth daily as needed (migraine).   . Colchicine  0.6 MG CAPS 0.6 mg.  . ferrous sulfate 325 (65 FE) MG tablet Take 1 tablet (325 mg total) by mouth daily with breakfast.  . gabapentin (NEURONTIN) 100 MG capsule Take 1 capsule (100 mg total) by mouth at bedtime.  Marland Kitchen ibuprofen (ADVIL,MOTRIN) 800 MG tablet Take 1 tablet (800 mg total) by mouth every 8 (eight) hours as needed for headache.     Allergies:   Clarithromycin and Sudafed [pseudoephedrine hcl]   Social History   Socioeconomic History  . Marital status: Single    Spouse name: Not on file  . Number of children: Not on file  . Years of education: Not on file  . Highest education level: Not on file  Occupational History  . Not on file  Tobacco Use  . Smoking status: Never  Smoker  . Smokeless tobacco: Never Used  Substance and Sexual Activity  . Alcohol use: No    Alcohol/week: 0.0 standard drinks  . Drug use: No  . Sexual activity: Never    Birth control/protection: Pill  Other Topics Concern  . Not on file  Social History Narrative  . Not on file   Social Determinants of Health   Financial Resource Strain:   . Difficulty of Paying Living Expenses: Not on file  Food Insecurity:   . Worried About Charity fundraiser in the Last Year: Not on file  . Ran Out of Food in the Last Year: Not on file  Transportation Needs:   . Lack of Transportation (Medical): Not on file  . Lack of Transportation (Non-Medical): Not on file  Physical Activity:   . Days of Exercise per Week: Not on file  . Minutes of Exercise per Session: Not on file  Stress:   . Feeling of Stress : Not on file  Social Connections:   . Frequency of Communication with Friends and Family: Not on file  . Frequency of Social Gatherings with Friends and Family: Not on file  . Attends Religious Services: Not on file  . Active Member of Clubs or Organizations: Not on file  . Attends Archivist Meetings: Not on file  . Marital Status: Not on file     Family History: The patient's family history includes COPD in her maternal grandfather; Cancer in her maternal grandmother and mother; Diabetes in her maternal grandmother; Early death in her father; Heart disease in her maternal grandfather; Hypertension in her maternal grandmother and mother; Thyroid disease in her mother.  ROS:   Please see the history of present illness.    All other systems reviewed and are negative.  EKGs/Labs/Other Studies Reviewed:    The following studies were reviewed today: CLINICAL DATA:  44 year old female erythema nodosum and dyspnea on exertion.  EXAM: CARDIAC MRI  TECHNIQUE: The patient was scanned on a 1.5 Tesla GE magnet. A dedicated cardiac coil was used. Functional imaging was done  using Fiesta sequences. 2,3, and 4 chamber views were done to assess for RWMA's. Modified Simpson's rule using a short axis stack was used to calculate an ejection fraction on a dedicated work Conservation officer, nature. The patient received 11 cc of Gadavist. After 10 minutes inversion recovery sequences were used to assess for infiltration and scar tissue.  CONTRAST:  11 cc  of Gadavist  FINDINGS: 1. Normal left ventricular size, thickness and systolic function (LVEF 99991111). There are no regional wall motion abnormalities.  There is no late gadolinium enhancement in the left ventricular myocardium.  LVEDD: 49 mm  LVESD: 36 mm  LVEDV: 125 ml  LVESV: 52 ml  SV: 73 ml  CO: 5.2 L/minute  Myocardial mass: 87 g  2. Normal right ventricular size, thickness and systolic function (LVEF XX123456). There are no regional wall motion abnormalities.  3.  Normal left and right atrial size.  4. Normal size of the aortic root, ascending aorta and pulmonary artery.  5.  Mild mitral and trivial tricuspid regurgitation.  6.  Normal pericardium.  No pericardial effusion.  IMPRESSION: 1. Normal left ventricular size, thickness and systolic function (LVEF 99991111). There are no regional wall motion abnormalities.  There is no late gadolinium enhancement in the left ventricular myocardium.  2. Normal right ventricular size, thickness and systolic function (LVEF XX123456). There are no regional wall motion abnormalities.  3.  Normal left and right atrial size.  4. Normal size of the aortic root, ascending aorta and pulmonary artery.  5.  Mild mitral and trivial tricuspid regurgitation.  6.  Normal pericardium.  No pericardial effusion.  There is no evidence for an infiltrative or inflammatory cardiomyopathy such as sarcoidosis.  Ena Dawley   Electronically Signed   By: Ena Dawley   On: 04/25/2018 17:23   Recent Labs: No results found for  requested labs within last 8760 hours.  Recent Lipid Panel    Component Value Date/Time   CHOL 155 05/19/2018 0958   TRIG 63 05/19/2018 0958   HDL 51 05/19/2018 0958   CHOLHDL 3.0 05/19/2018 0958   CHOLHDL 2.5 08/15/2014 0846   VLDL 10 08/15/2014 0846   LDLCALC 91 05/19/2018 0958    Physical Exam:    VS:  BP 118/78   Pulse 92   Ht 5\' 6"  (1.676 m)   Wt 193 lb (87.5 kg)   SpO2 99%   BMI 31.15 kg/m     Wt Readings from Last 3 Encounters:  07/25/19 193 lb (87.5 kg)  05/19/18 195 lb (88.5 kg)  03/17/18 197 lb (89.4 kg)     GEN: Patient is in no acute distress HEENT: Normal NECK: No JVD; No carotid bruits LYMPHATICS: No lymphadenopathy CARDIAC: Hear sounds regular, 2/6 systolic murmur at the apex. RESPIRATORY:  Clear to auscultation without rales, wheezing or rhonchi  ABDOMEN: Soft, non-tender, non-distended MUSCULOSKELETAL:  No edema; No deformity  SKIN: Warm and dry NEUROLOGIC:  Alert and oriented x 3 PSYCHIATRIC:  Normal affect   Signed, Jenean Lindau, MD  07/25/2019 9:21 AM    [place]

## 2019-07-26 ENCOUNTER — Telehealth: Payer: Self-pay

## 2019-07-26 ENCOUNTER — Encounter: Payer: Self-pay | Admitting: Medical

## 2019-07-26 DIAGNOSIS — R931 Abnormal findings on diagnostic imaging of heart and coronary circulation: Secondary | ICD-10-CM | POA: Diagnosis not present

## 2019-07-26 DIAGNOSIS — Z13220 Encounter for screening for lipoid disorders: Secondary | ICD-10-CM | POA: Diagnosis not present

## 2019-07-26 DIAGNOSIS — Z1329 Encounter for screening for other suspected endocrine disorder: Secondary | ICD-10-CM | POA: Diagnosis not present

## 2019-07-26 MED ORDER — ALPRAZOLAM 1 MG PO TABS: 1.0000 mg | ORAL_TABLET | Freq: Two times a day (BID) | ORAL | 0 refills | Status: DC | PRN

## 2019-07-26 MED FILL — ALPRAZolam 1 MG TABS: 1 | 10 days supply | Qty: 20 | Fill #0

## 2019-07-26 NOTE — Telephone Encounter (Signed)
Refill sent to provider

## 2019-07-26 NOTE — Telephone Encounter (Signed)
Spoke to patient and scheduled appointment for patient

## 2019-07-26 NOTE — Telephone Encounter (Addendum)
Requesting:Xanax  Contract: 10/27/18 UDS:10/27/18 Last OV:09/29/18 Next OV:n/a Last Refill:04/04/19 #20 tab 0rf Database: refilled data base today   Please advise   Advise pt to follow up before she need next refill.

## 2019-07-27 LAB — HEPATIC FUNCTION PANEL
ALT: 12 IU/L (ref 0–32)
AST: 17 IU/L (ref 0–40)
Albumin: 4.2 g/dL (ref 3.8–4.8)
Alkaline Phosphatase: 85 IU/L (ref 39–117)
Bilirubin Total: 0.3 mg/dL (ref 0.0–1.2)
Bilirubin, Direct: 0.08 mg/dL (ref 0.00–0.40)
Total Protein: 6.9 g/dL (ref 6.0–8.5)

## 2019-07-27 LAB — LIPID PANEL
Chol/HDL Ratio: 2.7 ratio (ref 0.0–4.4)
Cholesterol, Total: 188 mg/dL (ref 100–199)
HDL: 69 mg/dL (ref >39)
LDL Chol Calc (NIH): 107 mg/dL — ABNORMAL HIGH (ref 0–99)
Triglycerides: 63 mg/dL (ref 0–149)
VLDL Cholesterol Cal: 12 mg/dL (ref 5–40)

## 2019-07-27 LAB — BASIC METABOLIC PANEL
BUN/Creatinine Ratio: 12 (ref 9–23)
BUN: 10 mg/dL (ref 6–24)
CO2: 24 mmol/L (ref 20–29)
Calcium: 8.8 mg/dL (ref 8.7–10.2)
Chloride: 103 mmol/L (ref 96–106)
Creatinine, Ser: 0.86 mg/dL (ref 0.57–1.00)
GFR calc Af Amer: 96 mL/min/{1.73_m2} (ref >59)
GFR calc non Af Amer: 83 mL/min/{1.73_m2} (ref >59)
Glucose: 97 mg/dL (ref 65–99)
Potassium: 4 mmol/L (ref 3.5–5.2)
Sodium: 140 mmol/L (ref 134–144)

## 2019-07-27 LAB — TSH: TSH: 2.84 u[IU]/mL (ref 0.450–4.500)

## 2019-08-02 ENCOUNTER — Telehealth: Payer: Self-pay

## 2019-08-02 ENCOUNTER — Ambulatory Visit: Payer: 59 | Admitting: Medical

## 2019-08-02 NOTE — Telephone Encounter (Signed)
Results relayed, copy sent to Dr. Harvie Heck.

## 2019-08-27 ENCOUNTER — Ambulatory Visit: Payer: 59 | Admitting: Medical

## 2019-08-27 ENCOUNTER — Other Ambulatory Visit: Payer: Self-pay

## 2019-08-27 VITALS — BP 118/70 | HR 86 | Temp 95.5°F | Resp 18 | Ht 65.0 in | Wt 195.8 lb

## 2019-08-27 DIAGNOSIS — L309 Dermatitis, unspecified: Secondary | ICD-10-CM | POA: Diagnosis not present

## 2019-08-27 DIAGNOSIS — R41840 Attention and concentration deficit: Secondary | ICD-10-CM

## 2019-08-27 DIAGNOSIS — F419 Anxiety disorder, unspecified: Secondary | ICD-10-CM | POA: Diagnosis not present

## 2019-08-27 DIAGNOSIS — R0789 Other chest pain: Secondary | ICD-10-CM

## 2019-08-27 MED ORDER — BUPROPION HCL ER (XL) 150 MG PO TB24: ORAL_TABLET | ORAL | 5 refills | Status: DC

## 2019-08-27 MED ORDER — CLOTRIMAZOLE-BETAMETHASONE 1-0.05 % EX CREA: 1.0000 "application " | TOPICAL_CREAM | Freq: Two times a day (BID) | CUTANEOUS | 0 refills | Status: DC

## 2019-08-27 MED ORDER — BUSPIRONE HCL 7.5 MG PO TABS: 7.5000 mg | ORAL_TABLET | Freq: Two times a day (BID) | ORAL | 0 refills | Status: DC

## 2019-08-27 MED FILL — CLOTRIMAZOLE-BETAMETHASONE: 1-0.05 | 30 days supply | Qty: 30 | Fill #0

## 2019-08-27 MED FILL — BUSPIRONE HCL 7.5 MG TABS: 7.5 | 30 days supply | Qty: 60 | Fill #0

## 2019-08-27 MED FILL — buPROPion HCL ER (XL) 150 M: 150 | 30 days supply | Qty: 30 | Fill #0

## 2019-08-27 NOTE — Patient Instructions (Signed)
For history of anxiety and some potential ADD, I rx'd buspar and wellbutrin. Will dc xanax presently. Could increase buspar if helps as giving lower side dose today.  For rash on back, advise to moisturize area twice daily and rx lotrisone. Combination fungal and steroid cream. No vesicles seen. If any occur then notify us. Does not appear shingles like presently. Also quite prolonged duration. If rash persists then refer to dermatologist.  For transient mild chest pressure with negative cardiac work up still proceed with caution. If any severe constant chest pressure or associated cardiac symptoms be seen in ED.   Follow up one month by my chart or sooner if needed

## 2019-08-27 NOTE — Progress Notes (Signed)
Subjective:    Patient ID: Nicole Donaldson, female    DOB: [birth year], 44 y.o.   MRN: [id number]  HPI Pt last seen by myself one year ago. At that time she was anxious and had rx'd xanax. 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 Since wearing n 95 consistently less anxious. Now just using xanax periodically. Pt mostly now just using prior to sleep now. She is using about 20 tabs over 4 months.  Pt has hx of low level faint transient pressure at times. Pt has been followed by cardiolgist for this just saw them on 07/25/2019. Pt had negative stress test in past and negative echo as well. Cardiologist advised for her to lose weight.   Did at the end mentions some recent over past year worse concentration. But noticed has been present for past 2 years. Pt states does not remember concentration issues as youth.  lmp- started this past Friday.  Chronic mid lower spine in intermittent since December.   Review of Systems  Constitutional: Negative for chills, fatigue and fever.  Respiratory: Negative for cough, chest tightness, shortness of breath and wheezing.   Cardiovascular: Negative for chest pain and palpitations.  Gastrointestinal: Negative for abdominal pain, constipation, nausea and rectal pain.  Genitourinary: Negative for dyspareunia, enuresis and genital sores.  Musculoskeletal: Negative for back pain.  Skin: Negative for rash.  Neurological: Negative for dizziness, seizures, syncope, weakness and headaches.  Hematological: Negative for adenopathy. Does not bruise/bleed easily.  Psychiatric/Behavioral: Negative for behavioral problems and suicidal ideas. The patient is not nervous/anxious.    Past Medical History:  Diagnosis Date  . Anemia   . Asthma    childhood exercise induced  . Enlarged thyroid    during pregnancy  . Gestational diabetes    diet controlled  . Headache    Migraines  . History of blood transfusion    . History of hiatal hernia   . No pertinent past medical history   . Pinched nerve in neck      Social History   Socioeconomic History  . Marital status: Single    Spouse name: Not on file  . Number of children: Not on file  . Years of education: Not on file  . Highest education level: Not on file  Occupational History  . Not on file  Tobacco Use  . Smoking status: Never Smoker  . Smokeless tobacco: Never Used  Substance and Sexual Activity  . Alcohol use: No    Alcohol/week: 0.0 standard drinks  . Drug use: No  . Sexual activity: Never    Birth control/protection: Pill  Other Topics Concern  . Not on file  Social History Narrative  . Not on file   Social Determinants of Health   Financial Resource Strain:   . Difficulty of Paying Living Expenses: Not on file  Food Insecurity:   . Worried About Charity fundraiser in the Last Year: Not on file  . Ran Out of Food in the Last Year: Not on file  Transportation Needs:   . Lack of Transportation (Medical): Not on file  . Lack of Transportation (Non-Medical): Not on file  Physical Activity:   . Days of Exercise per Week: Not on file  . Minutes of Exercise per Session: Not on file  Stress:   . Feeling of Stress : Not on file  Social Connections:   . Frequency of Communication with Friends  and Family: Not on file  . Frequency of Social Gatherings with Friends and Family: Not on file  . Attends Religious Services: Not on file  . Active Member of Clubs or Organizations: Not on file  . Attends Archivist Meetings: Not on file  . Marital Status: Not on file  Intimate Partner Violence:   . Fear of Current or Ex-Partner: Not on file  . Emotionally Abused: Not on file  . Physically Abused: Not on file  . Sexually Abused: Not on file    Past Surgical History:  Procedure Laterality Date  . COLONOSCOPY    . DILATATION & CURETTAGE/HYSTEROSCOPY WITH MYOSURE N/A 03/19/2016   Procedure: DILATATION &  CURETTAGE/HYSTEROSCOPY WITH MYOSURE;  Surgeon: Servando Salina, MD;  Location: [facility];  Service: Gynecology;  Laterality: N/A;  . NO PAST SURGERIES    . UPPER GI ENDOSCOPY      Family History  Problem Relation Age of Onset  . Cancer Mother        breast  . Hypertension Mother   . Thyroid disease Mother   . Early death Father   . Cancer Maternal Grandmother        bladder  . Diabetes Maternal Grandmother   . Hypertension Maternal Grandmother   . COPD Maternal Grandfather   . Heart disease Maternal Grandfather     Allergies  Allergen Reactions  . Clarithromycin Swelling    Lip swelling  . Sudafed [Pseudoephedrine Hcl] Swelling    Lip swelling    Current Outpatient Medications on File Prior to Visit  Medication Sig Dispense Refill  . ALPRAZolam (XANAX) 1 MG tablet Take 1 tablet (1 mg total) by mouth 2 (two) times daily as needed. for anxiety 20 tablet 0  . Aspirin-Salicylamide-Caffeine (BC HEADACHE POWDER PO) Take 1 packet by mouth daily as needed (migraine).     . Colchicine 0.6 MG CAPS 0.6 mg.    . ferrous sulfate 325 (65 FE) MG tablet Take 1 tablet (325 mg total) by mouth daily with breakfast. 30 tablet 3  . gabapentin (NEURONTIN) 100 MG capsule Take 1 capsule (100 mg total) by mouth at bedtime. 90 capsule 3  . ibuprofen (ADVIL,MOTRIN) 800 MG tablet Take 1 tablet (800 mg total) by mouth every 8 (eight) hours as needed for headache. 30 tablet 1  . metroNIDAZOLE (FLAGYL) 500 MG tablet Take 1 tablet (500 mg total) by mouth 3 (three) times daily. 21 tablet 0   No current facility-administered medications on file prior to visit.    BP 118/70 (BP Location: Right Arm, Patient Position: Sitting, Cuff Size: Large)   Pulse 86   Temp (!) 95.5 F (35.3 C) (Temporal)   Resp 18   Ht 5\' 5"  (1.651 m)   Wt 195 lb 12.8 oz (88.8 kg)   LMP 08/24/2019 (Exact Date)   SpO2 98%   BMI 32.58 kg/m       Objective:   Physical Exam  General Mental Status- Alert. General Appearance-  Not in acute distress.   Skin General: Color- Normal Color. Moisture- Normal Moisture.  Neck Carotid Arteries- Normal color. Moisture- Normal Moisture. No carotid bruits. No JVD.  Chest and Lung Exam Auscultation: Breath Sounds:-Normal.  Cardiovascular Auscultation:Rythm- Regular. Murmurs & Other Heart Sounds:Auscultation of the heart reveals- No Murmurs.   Neurologic Cranial Nerve exam:- CN III-XII intact(No nystagmus), symmetric smile. Strength:- 5/5 equal and symmetric strength both upper and lower extremities.  Derm- mid lumbar spine mild diffuse herperpigmentation.      Assessment &  Plan:  For history of anxiety and some potential ADD, I rx'd buspar and wellbutrin. Will dc xanax presently. Could increase buspar if helps as giving lower side dose today.  For rash on back, advise to moisturize area twice daily and rx lotrisone. Combination fungal and steroid cream. No vesicles seen. If any occur then notify us. Does not appear shingles like presently. Also quite prolonged duration. If rash persists then refer to dermatologist.  For transient mild chest pressure with negative cardiac work up still proceed with caution. If any severe constant chest pressure or associated cardiac symptoms be seen in ED.   Follow up one month by my chart or sooner if   30 minutes spent with pt.

## 2019-10-08 ENCOUNTER — Ambulatory Visit (HOSPITAL_BASED_OUTPATIENT_CLINIC_OR_DEPARTMENT_OTHER)
Admission: RE | Admit: 2019-10-08 | Discharge: 2019-10-08 | Disposition: A | Discharge status: Home / Self Care | Payer: 59 | Source: Ambulatory Visit | Attending: Obstetrics and Gynecology | Admitting: Obstetrics and Gynecology

## 2019-10-08 ENCOUNTER — Other Ambulatory Visit (HOSPITAL_BASED_OUTPATIENT_CLINIC_OR_DEPARTMENT_OTHER): Payer: Self-pay | Admitting: Medical

## 2019-10-08 ENCOUNTER — Other Ambulatory Visit: Payer: Self-pay

## 2019-10-08 ENCOUNTER — Other Ambulatory Visit (HOSPITAL_BASED_OUTPATIENT_CLINIC_OR_DEPARTMENT_OTHER): Payer: Self-pay | Admitting: Obstetrics and Gynecology

## 2019-10-08 DIAGNOSIS — Z1231 Encounter for screening mammogram for malignant neoplasm of breast: Secondary | ICD-10-CM | POA: Diagnosis not present

## 2019-11-27 ENCOUNTER — Other Ambulatory Visit: Payer: Self-pay

## 2019-11-27 ENCOUNTER — Encounter (HOSPITAL_BASED_OUTPATIENT_CLINIC_OR_DEPARTMENT_OTHER): Payer: Self-pay | Admitting: Emergency Medicine

## 2019-11-27 ENCOUNTER — Emergency Department (HOSPITAL_BASED_OUTPATIENT_CLINIC_OR_DEPARTMENT_OTHER)
Admission: EM | Admit: 2019-11-27 | Discharge: 2019-11-27 | Disposition: A | Discharge status: Home / Self Care | Payer: 59 | Attending: Emergency Medicine | Admitting: Emergency Medicine

## 2019-11-27 DIAGNOSIS — T7840XA Allergy, unspecified, initial encounter: Secondary | ICD-10-CM | POA: Diagnosis not present

## 2019-11-27 DIAGNOSIS — T783XXA Angioneurotic edema, initial encounter: Secondary | ICD-10-CM | POA: Diagnosis not present

## 2019-11-27 DIAGNOSIS — R21 Rash and other nonspecific skin eruption: Secondary | ICD-10-CM | POA: Diagnosis present

## 2019-11-27 DIAGNOSIS — J45909 Unspecified asthma, uncomplicated: Secondary | ICD-10-CM | POA: Diagnosis not present

## 2019-11-27 MED ORDER — DIPHENHYDRAMINE HCL 50 MG/ML IJ SOLN: 25.0000 mg | Freq: Once | INTRAMUSCULAR | Status: AC

## 2019-11-27 MED ORDER — METHYLPREDNISOLONE SODIUM SUCC 125 MG IJ SOLR: 125.0000 mg | Freq: Once | INTRAMUSCULAR | Status: AC

## 2019-11-27 MED ORDER — METHYLPREDNISOLONE 4 MG PO TBPK
ORAL_TABLET | ORAL | 0 refills | Status: DC
Start: 2019-11-27 — End: 2019-11-28

## 2019-11-27 MED ORDER — METHYLPREDNISOLONE SODIUM SUCC 125 MG IJ SOLR
INTRAMUSCULAR | Status: AC
  Administered 2019-11-27: 125 mg via INTRAVENOUS
  Filled 2019-11-27: qty 2

## 2019-11-27 MED ORDER — EPINEPHRINE 0.3 MG/0.3ML IJ SOAJ
0.3000 mg | Freq: Once | INTRAMUSCULAR | Status: AC
  Administered 2019-11-27: 0.3 mg via INTRAMUSCULAR
  Filled 2019-11-27: qty 0.3

## 2019-11-27 MED ORDER — EPINEPHRINE 0.3 MG/0.3ML IJ SOAJ: 0.3000 mg | INTRAMUSCULAR | 2 refills | Status: DC | PRN

## 2019-11-27 MED ORDER — FAMOTIDINE IN NACL 20-0.9 MG/50ML-% IV SOLN: 20.0000 mg | Freq: Once | INTRAVENOUS | Status: AC

## 2019-11-27 MED ORDER — FAMOTIDINE IN NACL 20-0.9 MG/50ML-% IV SOLN
INTRAVENOUS | Status: AC
  Administered 2019-11-27: 20 mg via INTRAVENOUS
  Filled 2019-11-27: qty 50

## 2019-11-27 MED ORDER — DIPHENHYDRAMINE HCL 50 MG/ML IJ SOLN
INTRAMUSCULAR | Status: AC
  Administered 2019-11-27: 25 mg via INTRAVENOUS
  Filled 2019-11-27: qty 1

## 2019-11-27 MED FILL — EPINEPHRINE 0.3 MG AUTO-INJ: 0.3 | 30 days supply | Qty: 2 | Fill #0

## 2019-11-27 MED FILL — METHYLPREDNISOLONE 4 MG TBP: 4 | 6 days supply | Qty: 21 | Fill #0

## 2019-11-27 NOTE — Discharge Instructions (Signed)
Avoid peanuts, tree nuts and fish/shellfish products until you can be tested by an allergist.  Take Benadryl every 6 hours for the next 2 days.  Take the steroid prescription as written as well.  If you have any increased tongue swelling, throat swelling, trouble breathing, call 911.

## 2019-11-27 NOTE — ED Notes (Signed)
Pt reports symptoms are unchanged at this time. VSS.

## 2019-11-27 NOTE — ED Notes (Signed)
ED Provider at bedside. 

## 2019-11-27 NOTE — ED Provider Notes (Signed)
[facility] DEPARTMENT Provider Note   CSN: [id number] Arrival date & time: 11/27/19  0401     History Chief Complaint  Patient presents with  . Allergic Reaction    Nicole Donaldson is a 44 y.o. female.  Patient presents to the emergency department for evaluation of allergic reaction.  Patient reports that she started having itching rash on her extremities this evening.  She had eaten [employer] Fillet[employer]o Fish a couple of hours before onset of symptoms.  No new medications.  She took Benadryl and went to sleep.  She woke up this morning with continued itching on her extremities and then noticed that she had sudden swelling of her tongue.  She is able to swallow but she does feel like her throat is swollen.  No difficulty breathing.        Past Medical History:  Diagnosis Date  . Anemia   . Asthma    childhood exercise induced  . Enlarged thyroid    during pregnancy  . Gestational diabetes    diet controlled  . Headache    Migraines  . History of blood transfusion   . History of hiatal hernia   . No pertinent past medical history   . Pinched nerve in neck     Patient Active Problem List   Diagnosis Date Noted  . Chest discomfort 07/25/2019  . Erythema nodosum 07/25/2019    Past Surgical History:  Procedure Laterality Date  . COLONOSCOPY    . DILATATION & CURETTAGE/HYSTEROSCOPY WITH MYOSURE N/A 03/19/2016   Procedure: DILATATION & CURETTAGE/HYSTEROSCOPY WITH MYOSURE;  Surgeon: Servando Salina, MD;  Location: [facility];  Service: Gynecology;  Laterality: N/A;  . NO PAST SURGERIES    . UPPER GI ENDOSCOPY       OB History    Gravida  1   Para  1   Term  1   Preterm      AB      Living  1     SAB      TAB      Ectopic      Multiple      Live Births  1           Family History  Problem Relation Age of Onset  . Cancer Mother        breast  . Hypertension Mother   . Thyroid disease Mother   . Early death Father   .  Cancer Maternal Grandmother        bladder  . Diabetes Maternal Grandmother   . Hypertension Maternal Grandmother   . COPD Maternal Grandfather   . Heart disease Maternal Grandfather     Social History   Tobacco Use  . Smoking status: Never Smoker  . Smokeless tobacco: Never Used  Substance Use Topics  . Alcohol use: No    Alcohol/week: 0.0 standard drinks  . Drug use: No    Home Medications Prior to Admission medications   Medication Sig Start Date End Date Taking? Authorizing Provider  EPINEPHrine (EPIPEN 2-PAK) 0.3 mg/0.3 mL IJ SOAJ injection Inject 0.3 mLs (0.3 mg total) into the muscle as needed for anaphylaxis. 11/27/19   Orpah Greek, MD  methylPREDNISolone (MEDROL DOSEPAK) 4 MG TBPK tablet As directed 11/27/19   Creston Klas, Gwenyth Allegra, MD    Allergies    Clarithromycin and Sudafed [pseudoephedrine hcl]  Review of Systems   Review of Systems  HENT: Negative for trouble swallowing.   Skin: Positive for  rash.  All other systems reviewed and are negative.   Physical Exam Updated Vital Signs BP 122/83   Pulse 95   Temp 98.1 F (36.7 C) (Oral)   Resp 19   Ht 5' 5.5" (1.664 m)   Wt 88.5 kg   SpO2 100%   BMI 31.96 kg/m   Physical Exam Vitals and nursing note reviewed.  Constitutional:      General: She is not in acute distress.    Appearance: Normal appearance. She is well-developed.  HENT:     Head: Normocephalic and atraumatic.     Right Ear: Hearing normal.     Left Ear: Hearing normal.     Nose: Nose normal.     Mouth/Throat:     Comments: Tongue swelling Eyes:     Conjunctiva/sclera: Conjunctivae normal.     Pupils: Pupils are equal, round, and reactive to light.  Cardiovascular:     Rate and Rhythm: Regular rhythm.     Heart sounds: S1 normal and S2 normal. No murmur. No friction rub. No gallop.   Pulmonary:     Effort: Pulmonary effort is normal. No respiratory distress.     Breath sounds: Normal breath sounds.  Chest:     Chest  wall: No tenderness.  Abdominal:     General: Bowel sounds are normal.     Palpations: Abdomen is soft.     Tenderness: There is no abdominal tenderness. There is no guarding or rebound. Negative signs include Murphy's sign and McBurney's sign.     Hernia: No hernia is present.  Musculoskeletal:        General: Normal range of motion.     Cervical back: Normal range of motion and neck supple.  Skin:    General: Skin is warm and dry.     Findings: No rash.  Neurological:     Mental Status: She is alert and oriented to person, place, and time.     GCS: GCS eye subscore is 4. GCS verbal subscore is 5. GCS motor subscore is 6.     Cranial Nerves: No cranial nerve deficit.     Sensory: No sensory deficit.     Coordination: Coordination normal.  Psychiatric:        Speech: Speech normal.        Behavior: Behavior normal.        Thought Content: Thought content normal.     ED Results / Procedures / Treatments   Labs (all labs ordered are listed, but only abnormal results are displayed) Labs Reviewed - No data to display  EKG None  Radiology No results found.  Procedures Procedures (including critical care time)  Medications Ordered in ED Medications  EPINEPHrine (EPI-PEN) injection 0.3 mg (0.3 mg Intramuscular Given 11/27/19 0427)  diphenhydrAMINE (BENADRYL) injection 25 mg (25 mg Intravenous Given 11/27/19 0424)  methylPREDNISolone sodium succinate (SOLU-MEDROL) 125 mg/2 mL injection 125 mg (125 mg Intravenous Given 11/27/19 0424)  famotidine (PEPCID) IVPB 20 mg premix (0 mg Intravenous Stopped 11/27/19 0454)    ED Course  I have reviewed the triage vital signs and the nursing notes.  Pertinent labs & imaging results that were available during my care of the patient were reviewed by me and considered in my medical decision making (see chart for details).    MDM Rules/Calculators/A&P                      Patient presents to the emergency department for evaluation of acute  allergic reaction.  Patient started to have itching and rash on her torso and extremities this evening.  She ate a fillet'o fish sandwich from [employer] prior to onset.  She also had some peanuts.  She has never had any history of food allergy.  Patient took Benadryl and went to sleep but then woke up with tongue swelling.  At arrival she did have some evidence of anterior tongue swelling without any airway occlusion.  She was administered intramuscular epi, IV Solu-Medrol, Benadryl, Pepcid.  She has been monitored for several hours.  Tongue swelling is improved and all of her itching and rash resolved.  Patient counseled to avoid peanuts, tree nuts and shellfish until she can follow-up with an allergist.  We will continue antihistamine therapy on a scheduled dose for the next several days as well as a Medrol Dosepak.  Return for worsening symptoms.  Final Clinical Impression(s) / ED Diagnoses Final diagnoses:  Allergic reaction, initial encounter  Angioedema, initial encounter    Rx / DC Orders ED Discharge Orders         Ordered    EPINEPHrine (EPIPEN 2-PAK) 0.3 mg/0.3 mL IJ SOAJ injection  As needed     11/27/19 0654    methylPREDNISolone (MEDROL DOSEPAK) 4 MG TBPK tablet     11/27/19 0654           Orpah Greek, MD 11/27/19 [phone]

## 2019-11-27 NOTE — ED Triage Notes (Signed)
Pt with allergic reaction. Pt states her hands began to itch and now has swelling to tongue. Airway intact.

## 2019-11-28 ENCOUNTER — Telehealth: Payer: 59 | Admitting: Family

## 2019-11-28 ENCOUNTER — Ambulatory Visit: Payer: 59 | Admitting: Medical

## 2019-11-28 ENCOUNTER — Encounter: Payer: Self-pay | Admitting: Medical

## 2019-11-28 VITALS — BP 124/84 | HR 104 | Resp 18 | Ht 65.5 in | Wt 184.8 lb

## 2019-11-28 DIAGNOSIS — F419 Anxiety disorder, unspecified: Secondary | ICD-10-CM

## 2019-11-28 DIAGNOSIS — Z87892 Personal history of anaphylaxis: Secondary | ICD-10-CM

## 2019-11-28 DIAGNOSIS — T7840XA Allergy, unspecified, initial encounter: Secondary | ICD-10-CM | POA: Diagnosis not present

## 2019-11-28 DIAGNOSIS — T7840XD Allergy, unspecified, subsequent encounter: Secondary | ICD-10-CM

## 2019-11-28 MED ORDER — FLUCONAZOLE 150 MG PO TABS: 150.0000 mg | ORAL_TABLET | Freq: Once | ORAL | 0 refills | Status: AC

## 2019-11-28 MED ORDER — METHYLPREDNISOLONE ACETATE 40 MG/ML IJ SUSP
40.0000 mg | Freq: Once | INTRAMUSCULAR | Status: AC
  Administered 2019-11-28: 40 mg via INTRAMUSCULAR

## 2019-11-28 MED ORDER — PREDNISONE 10 MG (21) PO TBPK
ORAL_TABLET | ORAL | 0 refills | Status: DC
Start: 2019-11-28 — End: 2020-01-08

## 2019-11-28 MED ORDER — VENLAFAXINE HCL ER 37.5 MG PO CP24
37.5000 mg | ORAL_CAPSULE | Freq: Every day | ORAL | 3 refills | Status: DC
Start: 2019-11-28 — End: 2022-01-12

## 2019-11-28 MED ORDER — HYDROXYZINE HCL 25 MG PO TABS: 25.0000 mg | ORAL_TABLET | Freq: Three times a day (TID) | ORAL | 0 refills | Status: AC | PRN

## 2019-11-28 MED FILL — FLUCONAZOLE 150 MG TABS: 150 | 1 days supply | Qty: 1 | Fill #0

## 2019-11-28 MED FILL — predniSONE 10 MG TABS: 10 | 6 days supply | Qty: 21 | Fill #0

## 2019-11-28 MED FILL — hydrOXYzine HCL 25 MG TABS: 25 | 10 days supply | Qty: 30 | Fill #0

## 2019-11-28 MED FILL — VENLAFAXINE HCL ER 37.5 MG: 37.5 | 30 days supply | Qty: 30 | Fill #0

## 2019-11-28 NOTE — Patient Instructions (Addendum)
For hx of anaphylactic reaction, I placed referral to allergist. Asking to get you in by early next week.  For persisting rash we gave depomedrol 40 mg im. Can stop medrol dose pack. Rx 6 day taper prednisone.  Rx hydroxyzine in place of benadryl. Same precaution. Maybe little better/similar.  For anxiety rx effexor.  For yeast infection difulcan.  Use epi-pen if needed.  If anaphylactic type reaction reoccurs then ED evaluation again.  Follow up 2-3 weeks or as needed

## 2019-11-28 NOTE — Progress Notes (Signed)
Subjective:    Patient ID: Nicole Donaldson, female    DOB: July 26, 1975, 44 y.o.   MRN: [id number]  HPI  Monday night she broke out with rash on both her wrist. She took benadryl. Then later that night woke up and her tongue swelled and felt thick. State felt like could not swallow. No wheezing. No lip swelling.  Hpi at ED.  Patient presents to the emergency department for evaluation of allergic reaction.  Patient reports that she started having itching rash on her extremities this evening.  She had eaten [employer] Fillet[employer]o Fish a couple of hours before onset of symptoms.  No new medications.  She took Benadryl and went to sleep.  She woke up this morning with continued itching on her extremities and then noticed that she had sudden swelling of her tongue.  She is able to swallow but she does feel like her throat is swollen.  No difficulty breathing.   A/P from ED. Patient presents to the emergency department for evaluation of acute allergic reaction.  Patient started to have itching and rash on her torso and extremities this evening.  She ate a fillet'o fish sandwich from [employer] prior to onset.  She also had some peanuts.  She has never had any history of food allergy.  Patient took Benadryl and went to sleep but then woke up with tongue swelling.  At arrival she did have some evidence of anterior tongue swelling without any airway occlusion.  She was administered intramuscular epi, IV Solu-Medrol, Benadryl, Pepcid.  She has been monitored for several hours.  Tongue swelling is improved and all of her itching and rash resolved.  Patient counseled to avoid peanuts, tree nuts and shellfish until she can follow-up with an allergist.  We will continue antihistamine therapy on a scheduled dose for the next several days as well as a Medrol Dosepak.  Return for worsening symptoms.   Pt was given solumedrol, pepcid, beneadryl and epipen.    Pt still has some rash on abdomen. But no recurrent tongue  swelling, no sob, no wheezing or light headed.  Pt told me she had pack of peanuts around time rash onset but also fillet of fish day of rash as well.        Review of Systems  Constitutional: Negative for chills, fatigue and fever.  HENT: Negative for congestion and drooling.   Respiratory: Negative for cough, chest tightness, shortness of breath and wheezing.   Cardiovascular: Negative for chest pain and palpitations.  Gastrointestinal: Negative for abdominal pain.  Genitourinary:       Vaginal itch. Maybe yeast.  Musculoskeletal: Negative for gait problem.  Skin: Negative for rash.       See hpi  Neurological: Negative for dizziness and headaches.  Hematological: Negative for adenopathy. Does not bruise/bleed easily.  Psychiatric/Behavioral: Negative for behavioral problems, hallucinations and sleep disturbance. The patient is nervous/anxious.        Pt has anxiety. buspar did not help. Pt willing to try effexor.    Past Medical History:  Diagnosis Date  . Anemia   . Asthma    childhood exercise induced  . Enlarged thyroid    during pregnancy  . Gestational diabetes    diet controlled  . Headache    Migraines  . History of blood transfusion   . History of hiatal hernia   . No pertinent past medical history   . Pinched nerve in neck      Social History  Socioeconomic History  . Marital status: Single    Spouse name: Not on file  . Number of children: Not on file  . Years of education: Not on file  . Highest education level: Not on file  Occupational History  . Not on file  Tobacco Use  . Smoking status: Never Smoker  . Smokeless tobacco: Never Used  Substance and Sexual Activity  . Alcohol use: No    Alcohol/week: 0.0 standard drinks  . Drug use: No  . Sexual activity: Never    Birth control/protection: Pill  Other Topics Concern  . Not on file  Social History Narrative  . Not on file   Social Determinants of Health   Financial Resource Strain:    . Difficulty of Paying Living Expenses:   Food Insecurity:   . Worried About Charity fundraiser in the Last Year:   . Arboriculturist in the Last Year:   Transportation Needs:   . Film/video editor (Medical):   Marland Kitchen Lack of Transportation (Non-Medical):   Physical Activity:   . Days of Exercise per Week:   . Minutes of Exercise per Session:   Stress:   . Feeling of Stress :   Social Connections:   . Frequency of Communication with Friends and Family:   . Frequency of Social Gatherings with Friends and Family:   . Attends Religious Services:   . Active Member of Clubs or Organizations:   . Attends Archivist Meetings:   Marland Kitchen Marital Status:   Intimate Partner Violence:   . Fear of Current or Ex-Partner:   . Emotionally Abused:   Marland Kitchen Physically Abused:   . Sexually Abused:     Past Surgical History:  Procedure Laterality Date  . COLONOSCOPY    . DILATATION & CURETTAGE/HYSTEROSCOPY WITH MYOSURE N/A 03/19/2016   Procedure: DILATATION & CURETTAGE/HYSTEROSCOPY WITH MYOSURE;  Surgeon: Servando Salina, MD;  Location: [facility];  Service: Gynecology;  Laterality: N/A;  . NO PAST SURGERIES    . UPPER GI ENDOSCOPY      Family History  Problem Relation Age of Onset  . Cancer Mother        breast  . Hypertension Mother   . Thyroid disease Mother   . Early death Father   . Cancer Maternal Grandmother        bladder  . Diabetes Maternal Grandmother   . Hypertension Maternal Grandmother   . COPD Maternal Grandfather   . Heart disease Maternal Grandfather     Allergies  Allergen Reactions  . Clarithromycin Swelling    Lip swelling  . Sudafed [Pseudoephedrine Hcl] Swelling    Lip swelling    Current Outpatient Medications on File Prior to Visit  Medication Sig Dispense Refill  . EPINEPHrine (EPIPEN 2-PAK) 0.3 mg/0.3 mL IJ SOAJ injection Inject 0.3 mLs (0.3 mg total) into the muscle as needed for anaphylaxis. 1 each 2  . methylPREDNISolone (MEDROL DOSEPAK) 4 MG TBPK  tablet As directed 21 tablet 0   No current facility-administered medications on file prior to visit.    BP 124/84 (BP Location: Left Arm, Patient Position: Sitting, Cuff Size: Normal)   Pulse (!) 118   Resp 18   Ht 5' 5.5" (1.664 m)   Wt 184 lb 12.8 oz (83.8 kg)   LMP 11/09/2019   SpO2 94%   BMI 30.28 kg/m       Objective:   Physical Exam  General- No acute distress. Pleasant patient. Neck- Full range  of motion, no jvd Lungs- Clear, even and unlabored. Heart- regular rate and rhythm. Neurologic- CNII- XII grossly intact. Skin- faint red rash on her thorax. Anterior aspect(shows pictures last night various areas of mild red rash)     Assessment & Plan:  For hx of anaphylactic reaction, I placed referral to allergist. Asking to get you in by early next week.  For persisting rash we gave depomedrol 40 mg im. Can stop medrol dose pack. Rx 6 day taper prednisone.  Rx hydroxyzine in place of benadryl. Same precaution. Maybe little better/similar.  For anxiety rx effexor.  For yeast infection difulcan.  Use epi-pen if needed.  If anaphylactic type reaction reoccurs then ED evaluation again.  Follow up 2-3 weeks or as needed  Mackie Pai, PA-C   Time spent with patient today was  30 minutes which consisted of chart review, discussing diagnosis, work up treatment and documentation.

## 2019-11-28 NOTE — Progress Notes (Signed)
Based on what you shared with me, I feel your condition warrants further evaluation and I recommend that you be seen for a face to face office visit.  Given you were having another allergic reaction and all over body itching you need to be seen face-to-face. I do not believe that your vaginal itching is coming from yeast.    NOTE: If you entered your credit card information for this eVisit, you will not be charged. You may see a "hold" on your card for the $35 but that hold will drop off and you will not have a charge processed.   If you are having a true medical emergency please call 911.      For an urgent face to face visit, [facility] has five urgent care centers for your convenience:      NEW:  [facility] [place] [facility] [place] Get Driving Directions [id number] [address] [place], [place] [address] . 10 am - 6pm Monday - Friday    [facility]) Get Driving Directions [id number] [address] [place], [place] [address] . 10 am to 8 pm Monday-Friday . 12 pm to 8 pm [facility] at [facility] Get Driving Directions [id number] [place], [place], [place] [address] . 8 am to 8 pm Monday-Friday . 9 am to 6 pm Saturday . 11 am to 6 pm Sunday     [facility] Get Driving Directions  [id number] [address], [place] [address] . 8 am to 8 pm Monday-Friday . 8 am to 4 pm [facility] at [place] Get Driving Directions [id number] [address] [place], [place] [address] . 12 pm to 6 pm Monday-Friday      Your e-visit answers were reviewed by a board certified advanced clinical practitioner to complete your personal care plan.  Thank you for using e-Visits.

## 2020-01-08 ENCOUNTER — Ambulatory Visit: Payer: 59 | Admitting: Allergy and Immunology

## 2020-01-08 ENCOUNTER — Encounter: Payer: Self-pay | Admitting: Allergy and Immunology

## 2020-01-08 ENCOUNTER — Other Ambulatory Visit: Payer: Self-pay

## 2020-01-08 VITALS — BP 110/80 | HR 85 | Temp 98.1°F | Resp 12 | Ht 64.72 in | Wt 192.2 lb

## 2020-01-08 DIAGNOSIS — T783XXA Angioneurotic edema, initial encounter: Secondary | ICD-10-CM | POA: Insufficient documentation

## 2020-01-08 DIAGNOSIS — T7840XD Allergy, unspecified, subsequent encounter: Secondary | ICD-10-CM

## 2020-01-08 DIAGNOSIS — H1013 Acute atopic conjunctivitis, bilateral: Secondary | ICD-10-CM

## 2020-01-08 DIAGNOSIS — L500 Allergic urticaria: Secondary | ICD-10-CM | POA: Diagnosis not present

## 2020-01-08 DIAGNOSIS — T7800XD Anaphylactic reaction due to unspecified food, subsequent encounter: Secondary | ICD-10-CM

## 2020-01-08 DIAGNOSIS — T7840XA Allergy, unspecified, initial encounter: Secondary | ICD-10-CM | POA: Insufficient documentation

## 2020-01-08 DIAGNOSIS — H1010 Acute atopic conjunctivitis, unspecified eye: Secondary | ICD-10-CM | POA: Insufficient documentation

## 2020-01-08 DIAGNOSIS — T783XXD Angioneurotic edema, subsequent encounter: Secondary | ICD-10-CM | POA: Diagnosis not present

## 2020-01-08 DIAGNOSIS — J3089 Other allergic rhinitis: Secondary | ICD-10-CM | POA: Diagnosis not present

## 2020-01-08 MED ORDER — FLUTICASONE PROPIONATE 50 MCG/ACT NA SUSP: NASAL | 5 refills | Status: DC

## 2020-01-08 MED ORDER — OLOPATADINE HCL 0.2 % OP SOLN
1.0000 [drp] | Freq: Every day | OPHTHALMIC | 4 refills | Status: DC | PRN
  Filled 2020-11-26: qty 2.5, 50d supply, fill #0

## 2020-01-08 MED ORDER — LEVOCETIRIZINE DIHYDROCHLORIDE 5 MG PO TABS
5.0000 mg | ORAL_TABLET | Freq: Every day | ORAL | 4 refills | Status: DC | PRN
  Filled 2020-11-26: qty 30, 30d supply, fill #0

## 2020-01-08 MED FILL — OLOPATADINE HCL 0.2 % SOLN: 0.2 | 25 days supply | Qty: 3 | Fill #0

## 2020-01-08 MED FILL — LEVOCETIRIZINE 5 MG TABLET: 5 | 30 days supply | Qty: 30 | Fill #0

## 2020-01-08 MED FILL — FLUTICASONE PROP 50 MCG SPR: 50 | 30 days supply | Qty: 16 | Fill #0

## 2020-01-08 NOTE — Patient Instructions (Addendum)
Allergic reaction The patients history suggests allergic reaction with an unclear trigger. Food allergen skin tests were negative today despite a positive histamine control. The negative predictive value for skin tests is excellent (greater than 95%). We will proceed with in vitro lab studies to help establish an etiology.  The following labs have been ordered: FCeRI antibody, anti-thyroglobulin antibody, thyroid peroxidase antibody, baseline serum tryptase, C4 level, CBC, CMP, ESR, ANA, and serum specific IgE against peanut, tree nut panel, fish panel, and alpha gal panel.   Should symptoms recur, a journal is to be kept recording any foods eaten, beverages consumed, medications taken within a 6 hour period prior to the onset of symptoms, as well as activities performed, and environmental conditions. For any symptoms concerning for anaphylaxis, epinephrine is to be administered and 911 is to be called immediately.  Seasonal and perennial allergic rhinitis  Aeroallergen avoidance measures have been discussed and provided in written form.  A prescription has been provided for levocetirizine(Xyzal), 5 mg daily as needed.  A prescription has been provided for fluticasone nasal spray, one spray per nostril 1-2 times daily as needed. Proper nasal spray technique has been discussed and demonstrated.  Nasal saline spray (i.e. Simply Saline) is recommended prior to medicated nasal sprays and as needed.  Allergic conjunctivitis  Treatment plan as outlined above for allergic rhinitis.  A prescription has been provided for generic Pataday, one drop per eye daily as needed.  If insurance does not cover this medication, medicated allergy eyedrops may be purchased over-the-counter as Restaurant manager, fast food.  I have also recommended eye lubricant drops (i.e., Natural Tears) as needed.   When lab results have returned you will be called with further recommendations. With the newly implemented  Cures Act, the labs may be visible to you at the same time they become visible to [place]. However, the results will typically not be addressed until all of the results are back, so please be patient.  Until you have heard from [place], please continue the treatment plan as outlined on your take home sheet.  Control of House Dust Mite Allergen  House dust mites play a major role in allergic asthma and rhinitis.  They occur in environments with high humidity wherever human skin, the food for dust mites is found. High levels have been detected in dust obtained from mattresses, pillows, carpets, upholstered furniture, bed covers, clothes and soft toys.  The principal allergen of the house dust mite is found in its feces.  A gram of dust may contain 1,000 mites and 250,000 fecal particles.  Mite antigen is easily measured in the air during house cleaning activities.    1. Encase mattresses, including the box spring, and pillow, in an air tight cover.  Seal the zipper end of the encased mattresses with wide adhesive tape. 2. Wash the bedding in water of 130 degrees Farenheit weekly.  Avoid cotton comforters/quilts and flannel bedding: the most ideal bed covering is the dacron comforter. 3. Remove all upholstered furniture from the bedroom. 4. Remove carpets, carpet padding, rugs, and non-washable window drapes from the bedroom.  Wash drapes weekly or use plastic window coverings. 5. Remove all non-washable stuffed toys from the bedroom.  Wash stuffed toys weekly. 6. Have the room cleaned frequently with a vacuum cleaner and a damp dust-mop.  The patient should not be in a room which is being cleaned and should wait 1 hour after cleaning before going into the room. 7. Close and seal all heating  outlets in the bedroom.  Otherwise, the room will become filled with dust-laden air.  An electric heater can be used to heat the room. 8. Reduce indoor humidity to less than 50%.  Do not use a humidifier.  Control of Mold  Allergen  Mold and fungi can grow on a variety of surfaces provided certain temperature and moisture conditions exist.  Outdoor molds grow on plants, decaying vegetation and soil.  The major outdoor mold, Alternaria and Cladosporium, are found in very high numbers during hot and dry conditions.  Generally, a late Summer - Fall peak is seen for common outdoor fungal spores.  Rain will temporarily lower outdoor mold spore count, but counts rise rapidly when the rainy period ends.  The most important indoor molds are Aspergillus and Penicillium.  Dark, humid and poorly ventilated basements are ideal sites for mold growth.  The next most common sites of mold growth are the bathroom and the kitchen.  Outdoor [employer] 1. Use air conditioning and keep windows closed 2. Avoid exposure to decaying vegetation. 3. Avoid leaf raking. 4. Avoid grain handling. 5. Consider wearing a face mask if working in moldy areas.  Indoor Mold Control 1. Maintain humidity below 50%. 2. Clean washable surfaces with 5% bleach solution. 3. Remove sources e.g. Contaminated carpets.  Reducing Pollen Exposure    1. Use nasal saline spray (i.e., Simply Saline) as needed. 2. Use eye lubricant drops (i.e., Natural Tears) as needed. 3. Do not hang sheets or clothing out to dry; pollen may collect on these items. 4. Do not mow lawns or spend time around freshly cut grass; mowing stirs up pollen. 5. Keep windows closed at night.  Keep car windows closed while driving. 6. Minimize morning activities outdoors, a time when pollen counts are usually at their highest. 7. Stay indoors as much as possible when pollen counts or humidity is high and on windy days when pollen tends to remain in the air longer. 8. Use air conditioning when possible.  Many air conditioners have filters that trap the pollen spores. 9. Use a HEPA room air filter to remove pollen form the indoor air you breathe.

## 2020-01-08 NOTE — Assessment & Plan Note (Signed)
   Treatment plan as outlined above for allergic rhinitis.  A prescription has been provided for generic Pataday, one drop per eye daily as needed.  If insurance does not cover this medication, medicated allergy eyedrops may be purchased over-the-counter as Pataday Extra Strength or Zaditor.  I have also recommended eye lubricant drops (i.e., Natural Tears) as needed. 

## 2020-01-08 NOTE — Assessment & Plan Note (Signed)
   Aeroallergen avoidance measures have been discussed and provided in written form.  A prescription has been provided for levocetirizine(Xyzal), 5 mg daily as needed.  A prescription has been provided for fluticasone nasal spray, one spray per nostril 1-2 times daily as needed. Proper nasal spray technique has been discussed and demonstrated.  Nasal saline spray (i.e. Simply Saline) is recommended prior to medicated nasal sprays and as needed.

## 2020-01-08 NOTE — Progress Notes (Signed)
New Patient Note  RE: Nicole Donaldson MRN: [id number] DOB: Nov 11, 1975 Date of Office Visit: 01/08/2020  Referring provider: Elise Benne Primary care provider: Mackie Pai, PA-C  Chief Complaint: Allergic Reaction, Angioedema, and Urticaria   History of present illness: Nicole Donaldson is a 44 y.o. female seen today in consultation requested by Mackie Pai, PA-C.  She reports that approximately 1 month ago she consumed coffee peanuts and a fish sandwich for dinner.  Several hours later she began "itching like crazy".  She woke up at 3:00 that morning with tongue swelling and went to the emergency department where she was treated with epinephrine and antihistamines.  She experienced mild pruritus throughout the day and then that night her "hives went crazy."  She went to the primary care physician the next morning and was given Medrol Dosepak and hydroxyzine.  A couple days later she again developed some lip swelling and was switched from the Medrol Dosepak to prednisone.  The symptoms resolved shortly thereafter and have not recurred since that time.  Other than consuming peanuts and fish for several hours before the onset of symptoms, she notes that she had been exposed to mold in one of the rooms in her townhome. She experiences nasal congestion, rhinorrhea, sneezing, postnasal drainage, nasal pruritus, and ocular pruritus.  The symptoms occur primarily during the springtime. She had exercise-induced bronchospasm during high school but has not had a problem with significant lower respiratory symptoms since that time.  She has no history of symptoms consistent with eczema.  Assessment and plan: Allergic reaction The patients history suggests allergic reaction with an unclear trigger. Food allergen skin tests were negative today despite a positive histamine control. The negative predictive value for skin tests is excellent (greater than 95%). We will proceed with in vitro lab  studies to help establish an etiology.  The following labs have been ordered: FCeRI antibody, anti-thyroglobulin antibody, thyroid peroxidase antibody, baseline serum tryptase, C4 level, CBC, CMP, ESR, ANA, and serum specific IgE against peanut, tree nut panel, fish panel, and alpha gal panel.   Should symptoms recur, a journal is to be kept recording any foods eaten, beverages consumed, medications taken within a 6 hour period prior to the onset of symptoms, as well as activities performed, and environmental conditions. For any symptoms concerning for anaphylaxis, epinephrine is to be administered and 911 is to be called immediately.  Seasonal and perennial allergic rhinitis  Aeroallergen avoidance measures have been discussed and provided in written form.  A prescription has been provided for levocetirizine(Xyzal), 5 mg daily as needed.  A prescription has been provided for fluticasone nasal spray, one spray per nostril 1-2 times daily as needed. Proper nasal spray technique has been discussed and demonstrated.  Nasal saline spray (i.e. Simply Saline) is recommended prior to medicated nasal sprays and as needed.  Allergic conjunctivitis  Treatment plan as outlined above for allergic rhinitis.  A prescription has been provided for generic Pataday, one drop per eye daily as needed.  If insurance does not cover this medication, medicated allergy eyedrops may be purchased over-the-counter as Restaurant manager, fast food.  I have also recommended eye lubricant drops (i.e., Natural Tears) as needed.   Meds ordered this encounter  Medications  . levocetirizine (XYZAL) 5 MG tablet    Sig: Take 1 tablet (5 mg total) by mouth daily as needed for allergies.    Dispense:  30 tablet    Refill:  5  . fluticasone (FLONASE)  50 MCG/ACT nasal spray    Sig: One spray per nostril 1-2 times a daily as needed    Dispense:  16 g    Refill:  5  . Olopatadine HCl (PATADAY) 0.2 % SOLN    Sig: Apply  1 drop to eye daily as needed.    Dispense:  2.5 mL    Refill:  5    Diagnostics: Environmental skin testing: Positive to ragweed pollen, mold, and dust mite antigen. Food allergen skin testing: Negative despite a positive histamine control.    Physical examination: Blood pressure 110/80, pulse 85, temperature 98.1 F (36.7 C), temperature source Oral, resp. rate 12, height 5' 4.72" (1.644 m), weight 192 lb 3.2 oz (87.2 kg), SpO2 99 %.  General: Alert, interactive, in no acute distress. HEENT: TMs pearly gray, turbinates moderately edematous without discharge, post-pharynx mildly erythematous. Neck: Supple without lymphadenopathy. Lungs: Clear to auscultation without wheezing, rhonchi or rales. CV: Normal S1, S2 without murmurs. Abdomen: Nondistended, nontender. Skin: Warm and dry, without lesions or rashes. Extremities:  No clubbing, cyanosis or edema. Neuro:   Grossly intact.  Review of systems:  Review of systems negative except as noted in HPI / PMHx or noted below: Review of Systems  Constitutional: Negative.   HENT: Negative.   Eyes: Negative.   Respiratory: Negative.   Cardiovascular: Negative.   Gastrointestinal: Negative.   Genitourinary: Negative.   Musculoskeletal: Negative.   Skin: Negative.   Neurological: Negative.   Endo/Heme/Allergies: Negative.   Psychiatric/Behavioral: Negative.     Past medical history:  Past Medical History:  Diagnosis Date  . Anemia   . Asthma    childhood exercise induced  . Enlarged thyroid    during pregnancy  . Gestational diabetes    diet controlled  . Headache    Migraines  . History of blood transfusion   . History of hiatal hernia   . No pertinent past medical history   . Pinched nerve in neck     Past surgical history:  Past Surgical History:  Procedure Laterality Date  . COLONOSCOPY    . DILATATION & CURETTAGE/HYSTEROSCOPY WITH MYOSURE N/A 03/19/2016   Procedure: DILATATION & CURETTAGE/HYSTEROSCOPY WITH  MYOSURE;  Surgeon: Servando Salina, MD;  Location: [facility];  Service: Gynecology;  Laterality: N/A;  . NO PAST SURGERIES    . UPPER GI ENDOSCOPY      Family history: Family History  Problem Relation Age of Onset  . Cancer Mother        breast  . Hypertension Mother   . Thyroid disease Mother   . Early death Father   . Cancer Maternal Grandmother        bladder  . Diabetes Maternal Grandmother   . Hypertension Maternal Grandmother   . COPD Maternal Grandfather   . Heart disease Maternal Grandfather     Social history: Social History   Socioeconomic History  . Marital status: Single    Spouse name: Not on file  . Number of children: Not on file  . Years of education: Not on file  . Highest education level: Not on file  Occupational History  . Not on file  Tobacco Use  . Smoking status: Never Smoker  . Smokeless tobacco: Never Used  Vaping Use  . Vaping Use: Never used  Substance and Sexual Activity  . Alcohol use: No    Alcohol/week: 0.0 standard drinks  . Drug use: No  . Sexual activity: Never    Birth control/protection: Pill  Other  Topics Concern  . Not on file  Social History Narrative  . Not on file   Social Determinants of Health   Financial Resource Strain:   . Difficulty of Paying Living Expenses:   Food Insecurity:   . Worried About Charity fundraiser in the Last Year:   . Arboriculturist in the Last Year:   Transportation Needs:   . Film/video editor (Medical):   Marland Kitchen Lack of Transportation (Non-Medical):   Physical Activity:   . Days of Exercise per Week:   . Minutes of Exercise per Session:   Stress:   . Feeling of Stress :   Social Connections:   . Frequency of Communication with Friends and Family:   . Frequency of Social Gatherings with Friends and Family:   . Attends Religious Services:   . Active Member of Clubs or Organizations:   . Attends Archivist Meetings:   Marland Kitchen Marital Status:   Intimate Partner Violence:   .  Fear of Current or Ex-Partner:   . Emotionally Abused:   Marland Kitchen Physically Abused:   . Sexually Abused:     Environmental History: Patient lives in a 43 year old townhouse with carpeting throughout, gassy, and central air.  There is mold/water damage in the home.  There are no pets in the home.  She is a non-smoker.  Current Outpatient Medications  Medication Sig Dispense Refill  . colchicine 0.6 MG tablet Take 0.6 mg by mouth daily.    . diphenhydrAMINE (BENADRYL) 50 MG capsule Take 50 mg by mouth every 6 (six) hours as needed.    Marland Kitchen EPINEPHrine (EPIPEN 2-PAK) 0.3 mg/0.3 mL IJ SOAJ injection Inject 0.3 mLs (0.3 mg total) into the muscle as needed for anaphylaxis. 1 each 2  . ibuprofen (ADVIL) 800 MG tablet Take 800 mg by mouth every 8 (eight) hours as needed.    . loratadine (CLARITIN) 10 MG tablet Take 10 mg by mouth daily as needed for allergies.    Marland Kitchen venlafaxine XR (EFFEXOR XR) 37.5 MG 24 hr capsule Take 1 capsule (37.5 mg total) by mouth daily with breakfast. 30 capsule 3  . fluticasone (FLONASE) 50 MCG/ACT nasal spray One spray per nostril 1-2 times a daily as needed 16 g 5  . hydrOXYzine (ATARAX/VISTARIL) 25 MG tablet Take 1 tablet (25 mg total) by mouth every 8 (eight) hours as needed for itching. (Patient not taking: Reported on 01/08/2020) 30 tablet 0  . levocetirizine (XYZAL) 5 MG tablet Take 1 tablet (5 mg total) by mouth daily as needed for allergies. 30 tablet 5  . Olopatadine HCl (PATADAY) 0.2 % SOLN Apply 1 drop to eye daily as needed. 2.5 mL 5   No current facility-administered medications for this visit.    Known medication allergies: Allergies  Allergen Reactions  . Clarithromycin Swelling    Lip swelling  . Sudafed [Pseudoephedrine Hcl] Swelling    Lip swelling    I appreciate the opportunity to take part in Camil's care. Please do not hesitate to contact me with questions.  Sincerely,   R. Edgar Frisk, MD

## 2020-01-08 NOTE — Assessment & Plan Note (Signed)
The patients history suggests allergic reaction with an unclear trigger. Food allergen skin tests were negative today despite a positive histamine control. The negative predictive value for skin tests is excellent (greater than 95%). We will proceed with in vitro lab studies to help establish an etiology.  The following labs have been ordered: FCeRI antibody, anti-thyroglobulin antibody, thyroid peroxidase antibody, baseline serum tryptase, C4 level, CBC, CMP, ESR, ANA, and serum specific IgE against peanut, tree nut panel, fish panel, and alpha gal panel.   Should symptoms recur, a journal is to be kept recording any foods eaten, beverages consumed, medications taken within a 6 hour period prior to the onset of symptoms, as well as activities performed, and environmental conditions. For any symptoms concerning for anaphylaxis, epinephrine is to be administered and 911 is to be called immediately.

## 2020-01-12 IMAGING — MG 2D DIGITAL SCREENING BILATERAL MAMMOGRAM WITH 3D TOMO WITH CAD
6 of 9 series · 6 of 25 positions shown · non-contrast
Comparison: Previous exam(s).

CLINICAL DATA: Screening.

EXAM:
2D DIGITAL SCREENING BILATERAL MAMMOGRAM WITH 3D TOMO WITH CAD

[L XCCL]
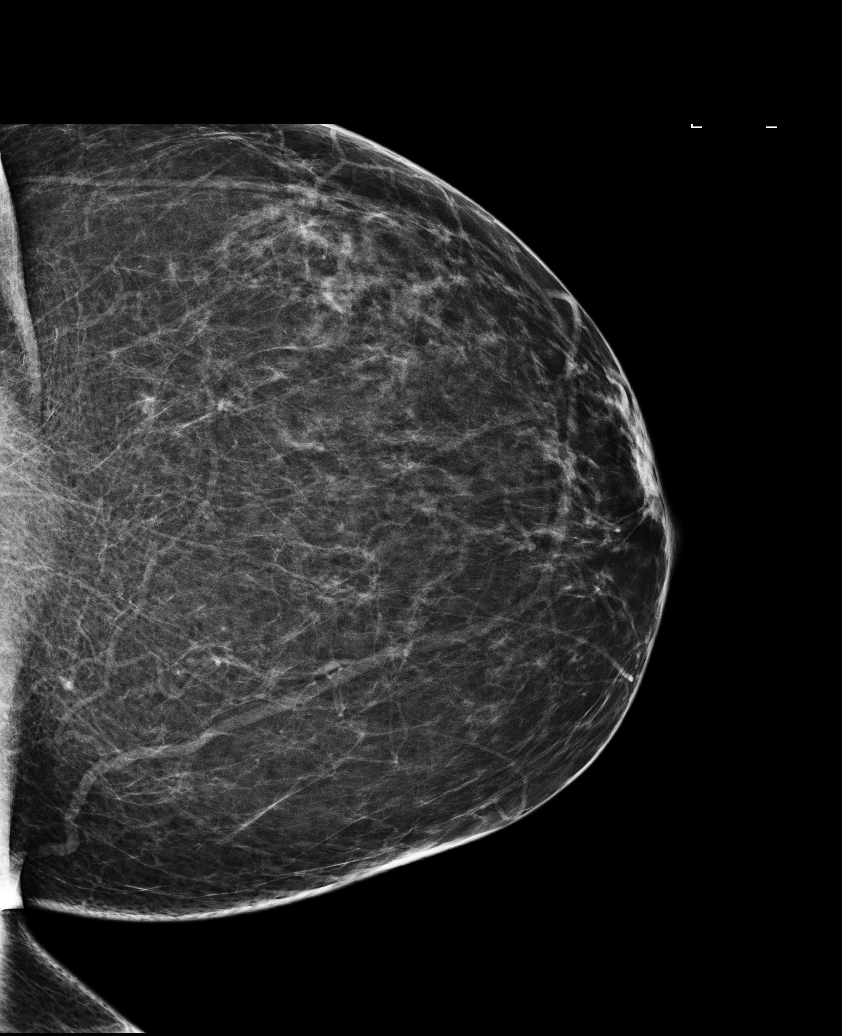

[L CC]
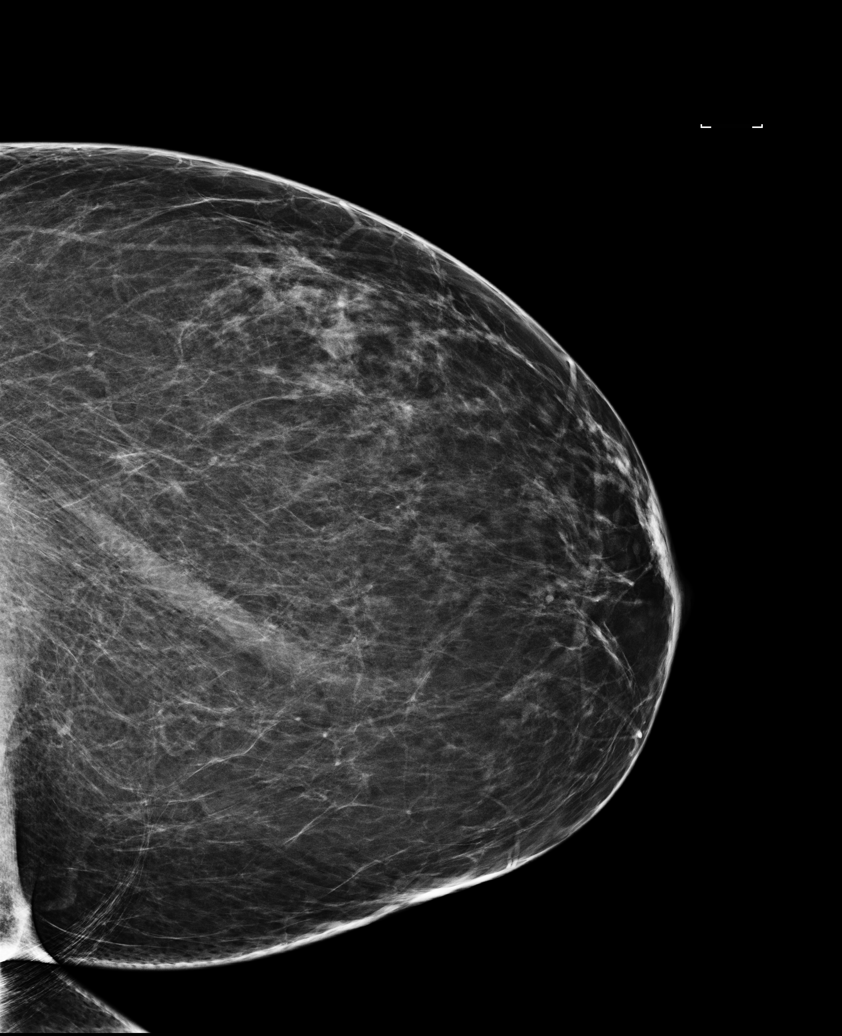

[R MLO]
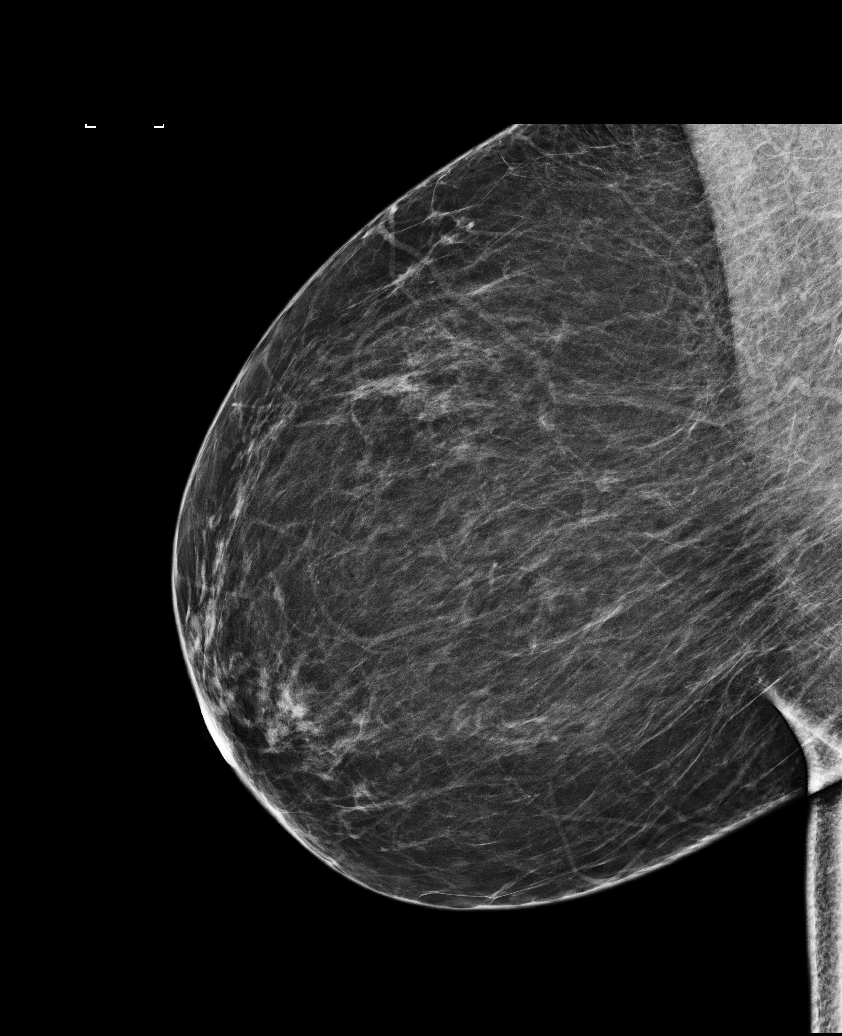

[R CC]
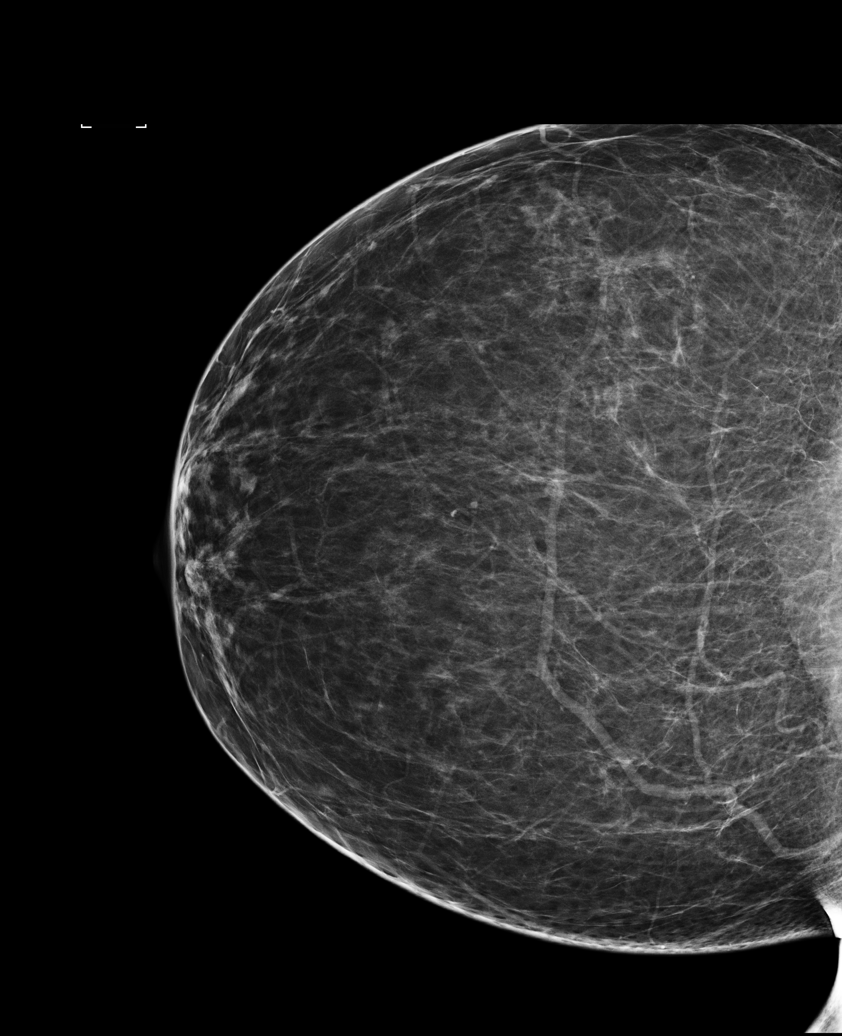

[L MLO]
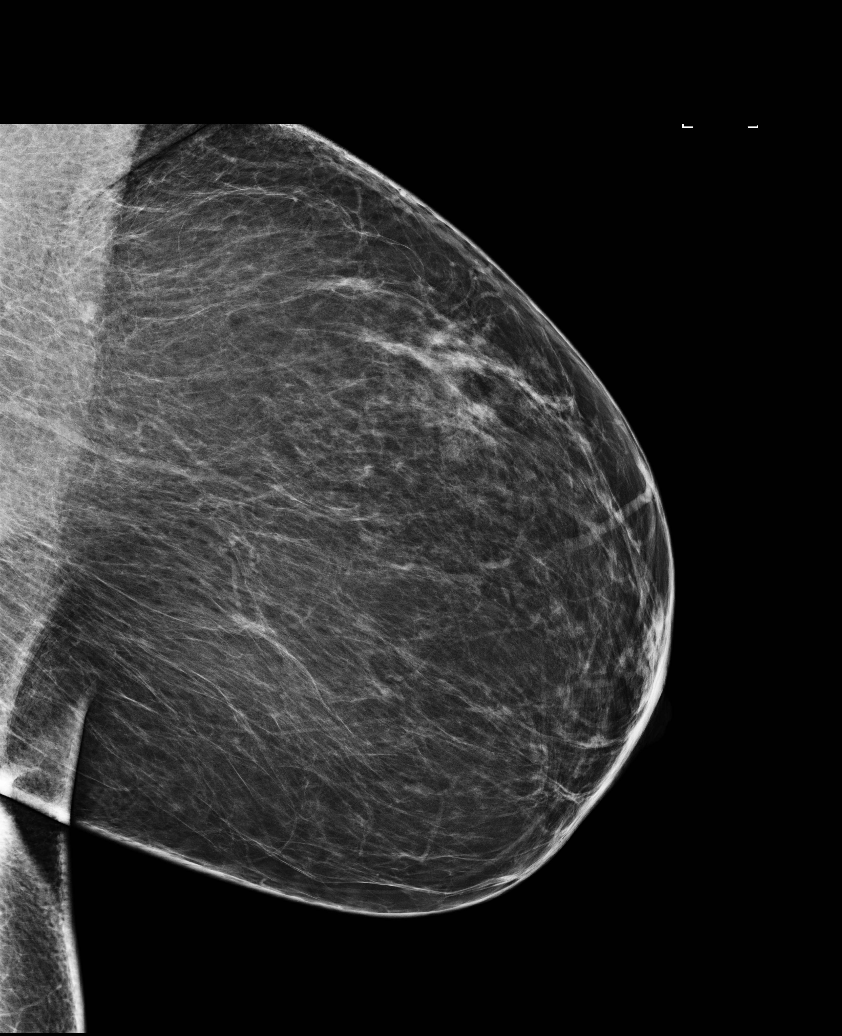

[R CC tomo · tomo slice 34/67.0]
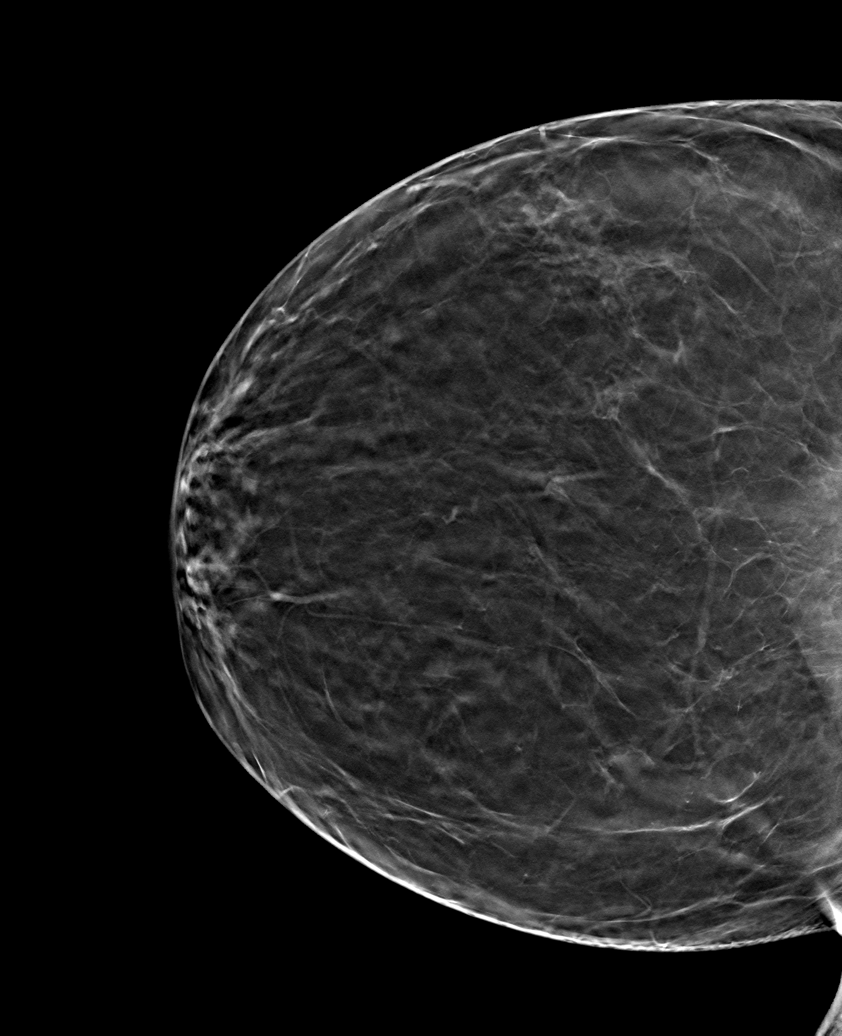

[6 of 25 positions shown; findings below may reference images not displayed]

ACR Breast Density Category b: There are scattered areas of
fibroglandular density.
FINDINGS: There are no findings suspicious for malignancy. Images were
processed with CAD.
IMPRESSION: No mammographic evidence of malignancy. A result letter of this
screening mammogram will be mailed directly to the patient.

RECOMMENDATION:
Screening mammogram in one year. (Code:[id number]-P-ZS0)

BI-RADS CATEGORY  1: Negative.

## 2020-01-16 LAB — IGE NUT PROF. W/COMPONENT RFLX
F017-IgE Hazelnut (Filbert): <0.1 kU/L
F018-IgE Brazil Nut: <0.1 kU/L
F020-IgE Almond: <0.1 kU/L
F202-IgE Cashew Nut: <0.1 kU/L
F203-IgE Pistachio Nut: <0.1 kU/L
F256-IgE Walnut: <0.1 kU/L
Macadamia Nut, IgE: <0.1 kU/L
Peanut, IgE: <0.1 kU/L
Pecan Nut IgE: <0.1 kU/L

## 2020-01-16 LAB — C4 COMPLEMENT: Complement C4, Serum: 23 mg/dL (ref 12–38)

## 2020-01-16 LAB — CBC WITH DIFFERENTIAL/PLATELET
Basophils Absolute: 0 10*3/uL (ref 0.0–0.2)
Basos: 1 %
EOS (ABSOLUTE): 0 10*3/uL (ref 0.0–0.4)
Eos: 1 %
Hematocrit: 34 % (ref 34.0–46.6)
Hemoglobin: 10.7 g/dL — ABNORMAL LOW (ref 11.1–15.9)
Immature Grans (Abs): 0 10*3/uL (ref 0.0–0.1)
Immature Granulocytes: 0 %
Lymphocytes Absolute: 1.1 10*3/uL (ref 0.7–3.1)
Lymphs: 23 %
MCH: 25.7 pg — ABNORMAL LOW (ref 26.6–33.0)
MCHC: 31.5 g/dL (ref 31.5–35.7)
MCV: 82 fL (ref 79–97)
Monocytes Absolute: 0.4 10*3/uL (ref 0.1–0.9)
Monocytes: 8 %
Neutrophils Absolute: 3.2 10*3/uL (ref 1.4–7.0)
Neutrophils: 67 %
Platelets: 374 10*3/uL (ref 150–450)
RBC: 4.17 x10E6/uL (ref 3.77–5.28)
RDW: 14.5 % (ref 11.7–15.4)
WBC: 4.8 10*3/uL (ref 3.4–10.8)

## 2020-01-16 LAB — COMPREHENSIVE METABOLIC PANEL
ALT: 10 IU/L (ref 0–32)
AST: 15 IU/L (ref 0–40)
Albumin/Globulin Ratio: 1.4 (ref 1.2–2.2)
Albumin: 4 g/dL (ref 3.8–4.8)
Alkaline Phosphatase: 83 IU/L (ref 48–121)
BUN/Creatinine Ratio: 12 (ref 9–23)
BUN: 10 mg/dL (ref 6–24)
Bilirubin Total: 0.2 mg/dL (ref 0.0–1.2)
CO2: 25 mmol/L (ref 20–29)
Calcium: 9 mg/dL (ref 8.7–10.2)
Chloride: 103 mmol/L (ref 96–106)
Creatinine, Ser: 0.81 mg/dL (ref 0.57–1.00)
GFR calc Af Amer: 103 mL/min/{1.73_m2} (ref >59)
GFR calc non Af Amer: 89 mL/min/{1.73_m2} (ref >59)
Globulin, Total: 2.8 g/dL (ref 1.5–4.5)
Glucose: 99 mg/dL (ref 65–99)
Potassium: 3.8 mmol/L (ref 3.5–5.2)
Sodium: 139 mmol/L (ref 134–144)
Total Protein: 6.8 g/dL (ref 6.0–8.5)

## 2020-01-16 LAB — ANA: Anti Nuclear Antibody (ANA): NEGATIVE

## 2020-01-16 LAB — ALLERGEN, PEANUT F13

## 2020-01-16 LAB — SEDIMENTATION RATE: Sed Rate: 47 mm/hr — ABNORMAL HIGH (ref 0–32)

## 2020-01-16 LAB — ALPHA-GAL PANEL
Alpha Gal IgE*: <0.1 kU/L (ref <0.10)
Beef (Bos spp) IgE: <0.1 kU/L (ref <0.35)
Class Interpretation: 0
Class Interpretation: 0
Class Interpretation: 0
Lamb/Mutton (Ovis spp) IgE: <0.1 kU/L (ref <0.35)
Pork (Sus spp) IgE: <0.1 kU/L (ref <0.35)

## 2020-01-16 LAB — TRYPTASE: Tryptase: 4.6 ug/L (ref 2.2–13.2)

## 2020-01-16 LAB — CHRONIC URTICARIA: cu index: 1.6 (ref <10)

## 2020-01-16 LAB — THYROID PEROXIDASE ANTIBODY: Thyroperoxidase Ab SerPl-aCnc: <9 IU/mL (ref 0–34)

## 2020-01-16 LAB — THYROGLOBULIN ANTIBODY: Thyroglobulin Antibody: <1 IU/mL (ref 0.0–0.9)

## 2020-01-17 DIAGNOSIS — T7840XD Allergy, unspecified, subsequent encounter: Secondary | ICD-10-CM | POA: Diagnosis not present

## 2020-01-17 NOTE — Addendum Note (Signed)
Addended by: Orpah Greek D on: 01/17/2020 11:31 AM   Modules accepted: Orders

## 2020-01-21 LAB — ALLERGEN PROFILE, FOOD-FISH
Allergen Mackerel IgE: <0.1 kU/L
Allergen Salmon IgE: <0.1 kU/L
Allergen Trout IgE: <0.1 kU/L
Allergen Walley Pike IgE: <0.1 kU/L
Codfish IgE: <0.1 kU/L
Halibut IgE: <0.1 kU/L
Tuna: <0.1 kU/L

## 2020-02-11 DIAGNOSIS — Z5181 Encounter for therapeutic drug level monitoring: Secondary | ICD-10-CM | POA: Diagnosis not present

## 2020-02-11 DIAGNOSIS — L52 Erythema nodosum: Secondary | ICD-10-CM | POA: Diagnosis not present

## 2020-02-11 DIAGNOSIS — L810 Postinflammatory hyperpigmentation: Secondary | ICD-10-CM | POA: Diagnosis not present

## 2020-02-11 MED FILL — COLCHICINE 0.6 MG CAPSULE: 0.6 | 60 days supply | Qty: 60 | Fill #0

## 2020-02-11 MED FILL — TRIAMCINOLONE ACETONIDE 0.1: 0.1 | 30 days supply | Qty: 454 | Fill #0

## 2020-02-11 MED FILL — HYDROQUINONE 4 % CREA: 4 | 28 days supply | Qty: 28 | Fill #0

## 2020-03-01 ENCOUNTER — Ambulatory Visit: Payer: 59 | Attending: Internal Medicine

## 2020-03-01 DIAGNOSIS — Z23 Encounter for immunization: Secondary | ICD-10-CM

## 2020-03-01 NOTE — Progress Notes (Signed)
   Covid-19 Vaccination Clinic  Name:  Nicole Donaldson    MRN: [id number] DOB: December 12, 1975  03/01/2020  Nicole Donaldson was observed post Covid-19 immunization for 30 minutes based on pre-vaccination screening without incident. She was provided with Vaccine Information Sheet and instruction to access the V-Safe system.   Nicole Donaldson was instructed to call 911 with any severe reactions post vaccine: Marland Kitchen Difficulty breathing  . Swelling of face and throat  . A fast heartbeat  . A bad rash all over body  . Dizziness and weakness   Immunizations Administered    Name Date Dose VIS Date Route   Pfizer COVID-19 Vaccine 03/01/2020  9:19 AM 0.3 mL 08/29/2018 Intramuscular   Manufacturer: [place]   Lot: [id number]   [place]: [id number]

## 2020-03-07 ENCOUNTER — Ambulatory Visit: Payer: 59

## 2020-03-14 ENCOUNTER — Ambulatory Visit: Payer: 59

## 2020-03-24 ENCOUNTER — Ambulatory Visit: Payer: 59 | Attending: Internal Medicine

## 2020-03-24 DIAGNOSIS — Z23 Encounter for immunization: Secondary | ICD-10-CM

## 2020-03-24 NOTE — Progress Notes (Signed)
   Covid-19 Vaccination Clinic  Name:  Nicole Donaldson    MRN: [id number] DOB: 1975-11-23  03/24/2020  Nicole Donaldson was observed post Covid-19 immunization for 30 minutes based on pre-vaccination screening without incident. She was provided with Vaccine Information Sheet and instruction to access the V-Safe system.  Vaccinated by Neoma Laming  Nicole Donaldson was instructed to call 911 with any severe reactions post vaccine: Marland Kitchen Difficulty breathing  . Swelling of face and throat  . A fast heartbeat  . A bad rash all over body  . Dizziness and weakness   Immunizations Administered    Name Date Dose VIS Date Route   Pfizer COVID-19 Vaccine 03/24/2020  9:20 AM 0.3 mL 08/29/2018 Intramuscular   Manufacturer: [employer]   Lot: [id number]   [place]: [id number]

## 2020-03-28 MED FILL — PFIZER-BIONTECH COVID-19 VA: 30 | 1 days supply | Qty: 0 | Fill #0

## 2020-05-16 ENCOUNTER — Other Ambulatory Visit (HOSPITAL_BASED_OUTPATIENT_CLINIC_OR_DEPARTMENT_OTHER): Payer: Self-pay | Admitting: Internal Medicine

## 2020-05-16 MED FILL — FLUARIX QUADRIVALENT 0.5 ML: 0.5 | 1 days supply | Qty: 1 | Fill #0

## 2020-08-15 DIAGNOSIS — Z5181 Encounter for therapeutic drug level monitoring: Secondary | ICD-10-CM | POA: Diagnosis not present

## 2020-08-24 IMAGING — DX DG CHEST 2V
2 series · 2 of 2 positions shown · non-contrast
Comparison: None.

CLINICAL DATA: 42-year-old female with chest pain.

EXAM:
CHEST - 2 VIEW

[chest pa]
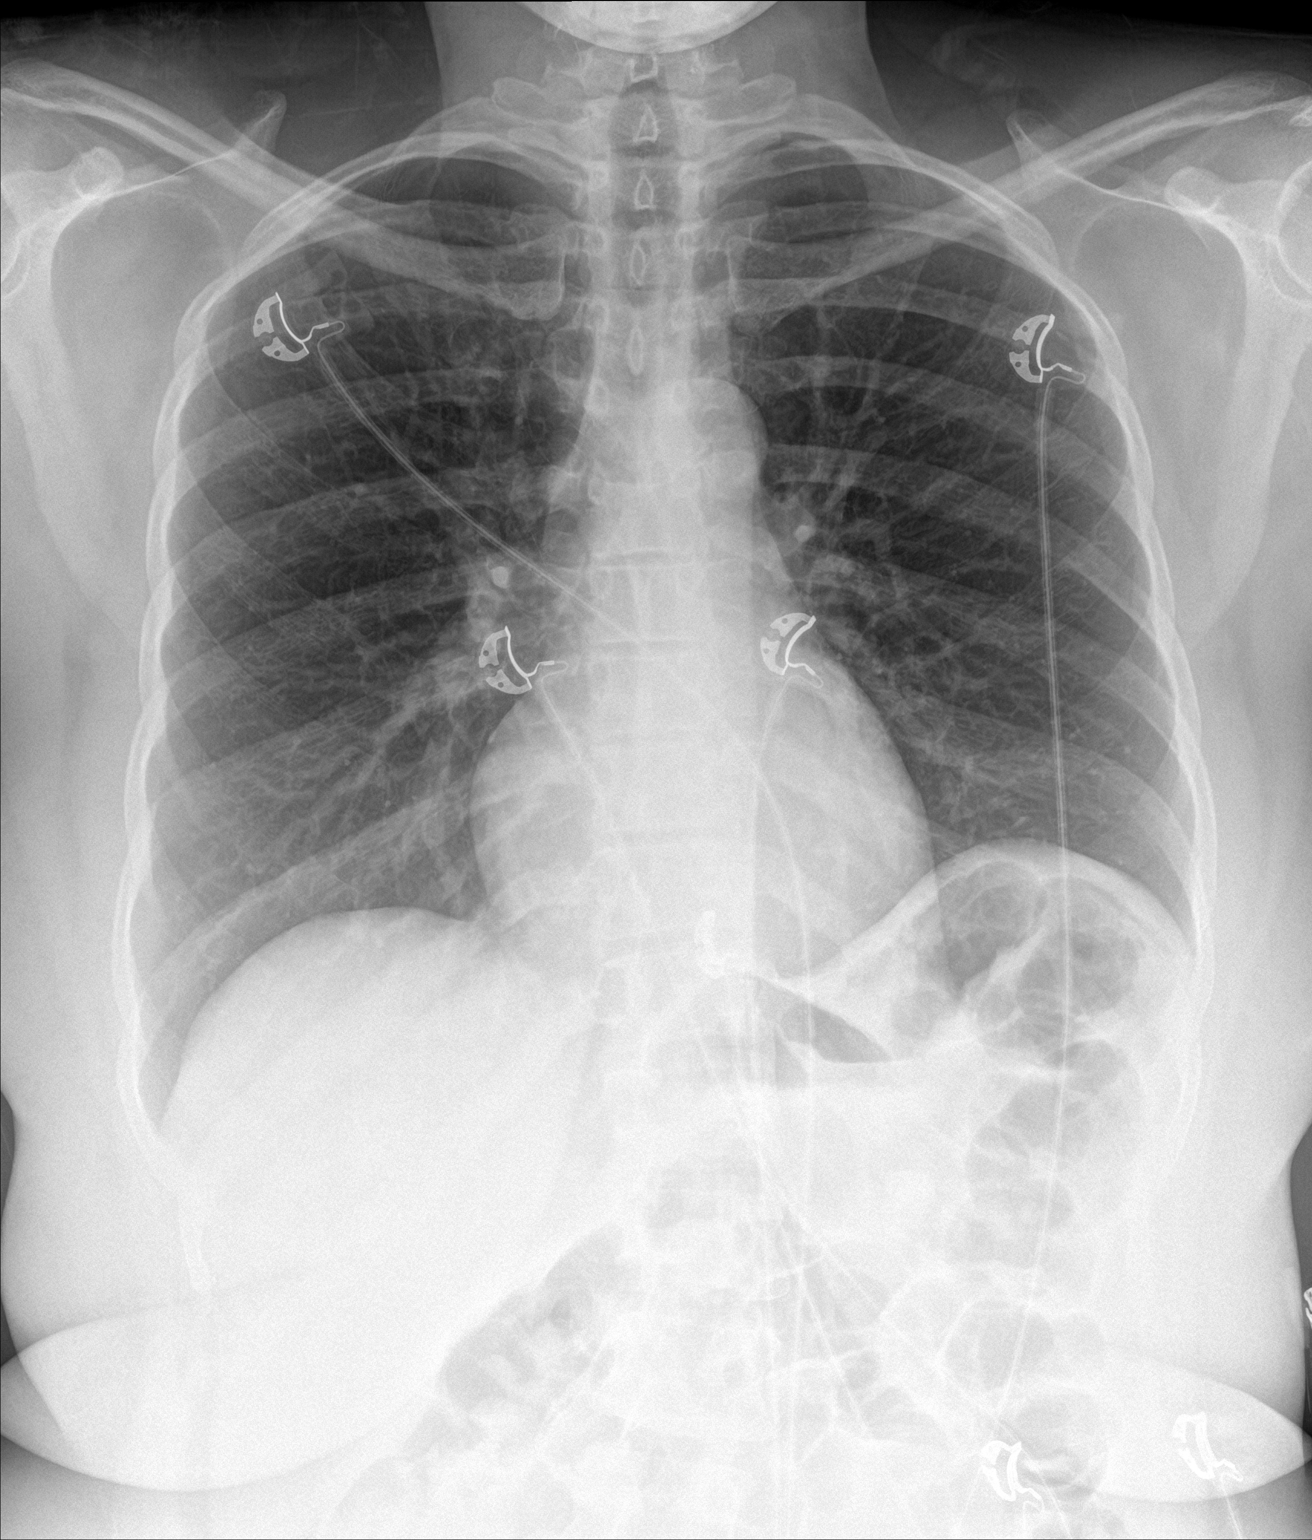

[chest lat]
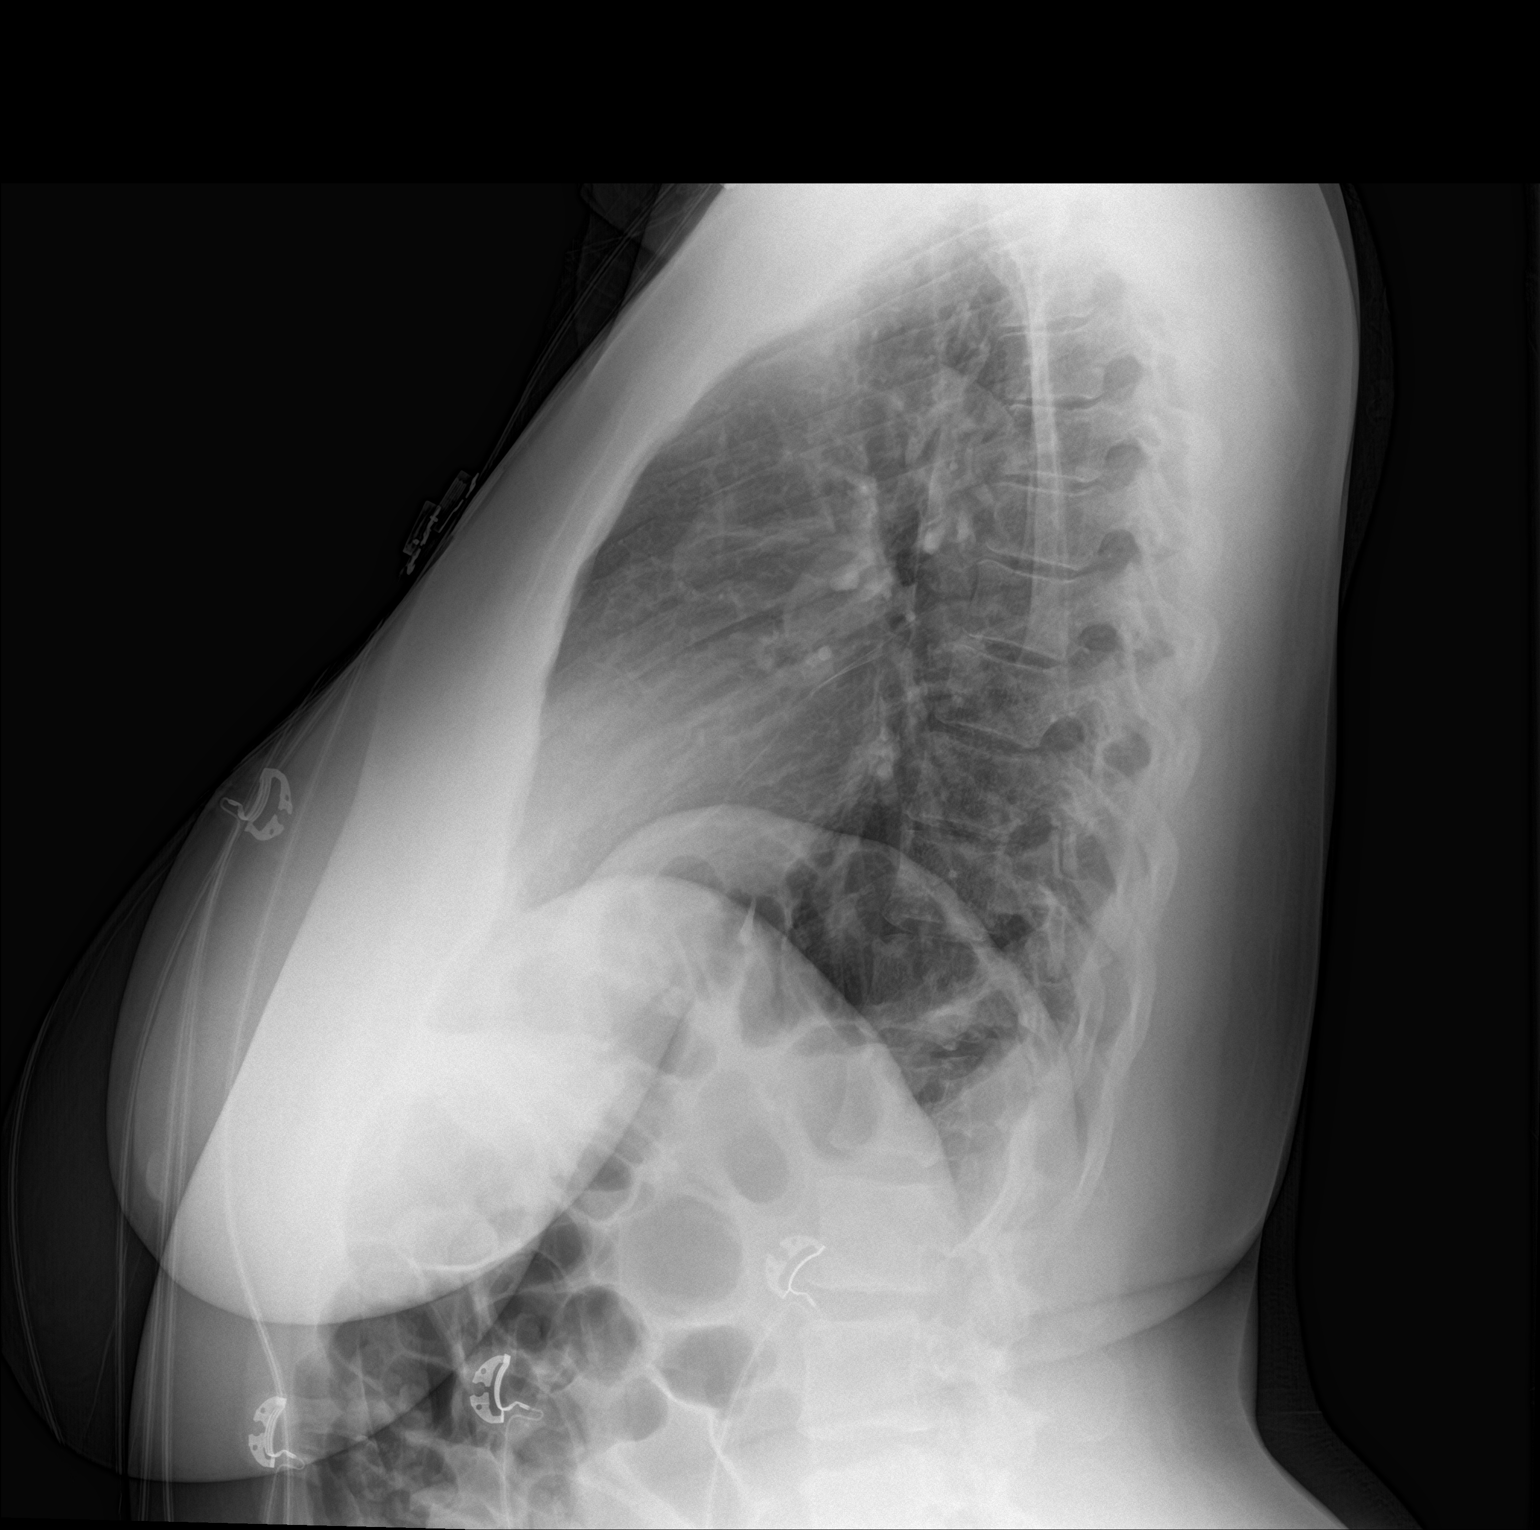

[2 of 2 positions shown; findings below may reference images not displayed]

FINDINGS: The heart size and mediastinal contours are within normal limits.
Both lungs are clear. The visualized skeletal structures are
unremarkable.
IMPRESSION: No active cardiopulmonary disease.

## 2020-09-30 ENCOUNTER — Other Ambulatory Visit (HOSPITAL_BASED_OUTPATIENT_CLINIC_OR_DEPARTMENT_OTHER): Payer: Self-pay | Admitting: Obstetrics and Gynecology

## 2020-09-30 DIAGNOSIS — Z139 Encounter for screening, unspecified: Secondary | ICD-10-CM

## 2020-10-14 ENCOUNTER — Other Ambulatory Visit: Payer: Self-pay

## 2020-10-14 ENCOUNTER — Ambulatory Visit (HOSPITAL_BASED_OUTPATIENT_CLINIC_OR_DEPARTMENT_OTHER)
Admission: RE | Admit: 2020-10-14 | Discharge: 2020-10-14 | Disposition: A | Discharge status: Home / Self Care | Payer: 59 | Source: Ambulatory Visit | Attending: Obstetrics and Gynecology | Admitting: Obstetrics and Gynecology

## 2020-10-14 ENCOUNTER — Encounter (HOSPITAL_BASED_OUTPATIENT_CLINIC_OR_DEPARTMENT_OTHER): Payer: Self-pay

## 2020-10-14 DIAGNOSIS — Z139 Encounter for screening, unspecified: Secondary | ICD-10-CM

## 2020-10-14 DIAGNOSIS — Z1231 Encounter for screening mammogram for malignant neoplasm of breast: Secondary | ICD-10-CM | POA: Diagnosis not present

## 2020-11-26 ENCOUNTER — Other Ambulatory Visit (HOSPITAL_BASED_OUTPATIENT_CLINIC_OR_DEPARTMENT_OTHER): Payer: Self-pay

## 2020-11-26 MED ORDER — COLCHICINE 0.6 MG PO CAPS
ORAL_CAPSULE | ORAL | 10 refills | Status: DC
  Filled 2020-11-26: qty 60, 60d supply, fill #0

## 2020-11-27 ENCOUNTER — Other Ambulatory Visit (HOSPITAL_BASED_OUTPATIENT_CLINIC_OR_DEPARTMENT_OTHER): Payer: Self-pay

## 2020-12-10 ENCOUNTER — Other Ambulatory Visit (HOSPITAL_BASED_OUTPATIENT_CLINIC_OR_DEPARTMENT_OTHER): Payer: Self-pay

## 2020-12-30 DIAGNOSIS — Z0000 Encounter for general adult medical examination without abnormal findings: Secondary | ICD-10-CM | POA: Diagnosis not present

## 2020-12-30 DIAGNOSIS — Z01419 Encounter for gynecological examination (general) (routine) without abnormal findings: Secondary | ICD-10-CM | POA: Diagnosis not present

## 2020-12-30 DIAGNOSIS — Z13220 Encounter for screening for lipoid disorders: Secondary | ICD-10-CM | POA: Diagnosis not present

## 2020-12-30 DIAGNOSIS — Z131 Encounter for screening for diabetes mellitus: Secondary | ICD-10-CM | POA: Diagnosis not present

## 2020-12-30 DIAGNOSIS — R5383 Other fatigue: Secondary | ICD-10-CM | POA: Diagnosis not present

## 2021-01-07 ENCOUNTER — Other Ambulatory Visit (HOSPITAL_BASED_OUTPATIENT_CLINIC_OR_DEPARTMENT_OTHER): Payer: Self-pay

## 2021-01-07 MED ORDER — VITAMIN D (ERGOCALCIFEROL) 1.25 MG (50000 UNIT) PO CAPS
ORAL_CAPSULE | ORAL | 4 refills | Status: DC
  Filled 2021-01-07: qty 4, 28d supply, fill #0
  Filled 2021-02-12: qty 4, 28d supply, fill #1
  Filled 2021-03-20: qty 4, 28d supply, fill #2
  Filled 2021-04-15: qty 4, 28d supply, fill #3
  Filled 2021-09-01: qty 4, 28d supply, fill #4

## 2021-02-12 ENCOUNTER — Other Ambulatory Visit (HOSPITAL_BASED_OUTPATIENT_CLINIC_OR_DEPARTMENT_OTHER): Payer: Self-pay

## 2021-02-13 ENCOUNTER — Other Ambulatory Visit (HOSPITAL_BASED_OUTPATIENT_CLINIC_OR_DEPARTMENT_OTHER): Payer: Self-pay

## 2021-03-10 ENCOUNTER — Other Ambulatory Visit (HOSPITAL_COMMUNITY): Payer: Self-pay

## 2021-03-20 ENCOUNTER — Other Ambulatory Visit (HOSPITAL_BASED_OUTPATIENT_CLINIC_OR_DEPARTMENT_OTHER): Payer: Self-pay

## 2021-04-15 ENCOUNTER — Other Ambulatory Visit (HOSPITAL_BASED_OUTPATIENT_CLINIC_OR_DEPARTMENT_OTHER): Payer: Self-pay

## 2021-04-28 ENCOUNTER — Other Ambulatory Visit (HOSPITAL_BASED_OUTPATIENT_CLINIC_OR_DEPARTMENT_OTHER): Payer: Self-pay

## 2021-04-28 MED ORDER — CARESTART COVID-19 HOME TEST VI KIT
PACK | 0 refills | Status: DC
  Filled 2021-04-28: qty 4, 4d supply, fill #0

## 2021-04-28 MED ORDER — INFLUENZA VAC SPLIT QUAD 0.5 ML IM SUSY
PREFILLED_SYRINGE | INTRAMUSCULAR | 0 refills | Status: DC
  Filled 2021-04-28: qty 0.5, 1d supply, fill #0

## 2021-05-15 ENCOUNTER — Other Ambulatory Visit (HOSPITAL_BASED_OUTPATIENT_CLINIC_OR_DEPARTMENT_OTHER): Payer: Self-pay

## 2021-05-15 MED ORDER — COLCHICINE 0.6 MG PO TABS
ORAL_TABLET | Freq: Every day | ORAL | 11 refills | Status: DC
  Filled 2021-05-15 (×4): qty 60, 60d supply, fill #0
  Filled 2022-02-21: qty 60, 60d supply, fill #1

## 2021-07-28 ENCOUNTER — Other Ambulatory Visit (HOSPITAL_BASED_OUTPATIENT_CLINIC_OR_DEPARTMENT_OTHER): Payer: Self-pay

## 2021-07-28 ENCOUNTER — Telehealth: Payer: 59 | Admitting: Physician Assistant

## 2021-07-28 DIAGNOSIS — B3731 Acute candidiasis of vulva and vagina: Secondary | ICD-10-CM | POA: Diagnosis not present

## 2021-07-28 MED ORDER — FLUCONAZOLE 150 MG PO TABS
150.0000 mg | ORAL_TABLET | Freq: Once | ORAL | 0 refills | Status: AC
  Filled 2021-07-28: qty 2, 3d supply, fill #0

## 2021-07-28 NOTE — Progress Notes (Signed)

## 2021-08-10 ENCOUNTER — Other Ambulatory Visit (HOSPITAL_BASED_OUTPATIENT_CLINIC_OR_DEPARTMENT_OTHER): Payer: Self-pay

## 2021-08-10 MED ORDER — CARESTART COVID-19 HOME TEST VI KIT
PACK | 0 refills | Status: DC
  Filled 2021-08-10: qty 2, 4d supply, fill #0

## 2021-08-26 ENCOUNTER — Other Ambulatory Visit (HOSPITAL_BASED_OUTPATIENT_CLINIC_OR_DEPARTMENT_OTHER): Payer: Self-pay

## 2021-08-26 DIAGNOSIS — L52 Erythema nodosum: Secondary | ICD-10-CM | POA: Diagnosis not present

## 2021-08-26 DIAGNOSIS — Z5181 Encounter for therapeutic drug level monitoring: Secondary | ICD-10-CM | POA: Diagnosis not present

## 2021-08-26 MED ORDER — COLCHICINE 0.6 MG PO TABS
0.6000 mg | ORAL_TABLET | Freq: Every day | ORAL | 11 refills | Status: DC
  Filled 2021-08-26: qty 60, 60d supply, fill #0

## 2021-09-01 ENCOUNTER — Other Ambulatory Visit (HOSPITAL_BASED_OUTPATIENT_CLINIC_OR_DEPARTMENT_OTHER): Payer: Self-pay

## 2021-09-02 ENCOUNTER — Other Ambulatory Visit (HOSPITAL_BASED_OUTPATIENT_CLINIC_OR_DEPARTMENT_OTHER): Payer: Self-pay

## 2021-09-09 ENCOUNTER — Telehealth: Payer: 59 | Admitting: Physician Assistant

## 2021-09-09 DIAGNOSIS — B9689 Other specified bacterial agents as the cause of diseases classified elsewhere: Secondary | ICD-10-CM | POA: Diagnosis not present

## 2021-09-09 DIAGNOSIS — J329 Chronic sinusitis, unspecified: Secondary | ICD-10-CM | POA: Diagnosis not present

## 2021-09-09 MED ORDER — DOXYCYCLINE HYCLATE 100 MG PO CAPS
100.0000 mg | ORAL_CAPSULE | Freq: Two times a day (BID) | ORAL | 0 refills | Status: DC
  Filled 2021-09-09: qty 20, 10d supply, fill #0

## 2021-09-09 MED ORDER — FLUTICASONE PROPIONATE 50 MCG/ACT NA SUSP
NASAL | 0 refills | Status: DC
  Filled 2021-09-09: qty 16, 30d supply, fill #0

## 2021-09-09 NOTE — Progress Notes (Signed)
E-Visit for Sinus Problems ? ?We are sorry that you are not feeling well.  Here is how we plan to help! ? ?Based on what you have shared with me it looks like you have sinusitis.  Sinusitis is inflammation and infection in the sinus cavities of the head.  Based on your presentation I believe you most likely have Acute Bacterial Sinusitis.  This is an infection caused by bacteria and is treated with antibiotics. I have prescribed Doxycycline '100mg'$  by mouth twice a day for 10 days. You may use an oral decongestant such as Mucinex D or if you have glaucoma or high blood pressure use plain Mucinex. Saline nasal spray help and can safely be used as often as needed for congestion.  If you develop worsening sinus pain, fever or notice severe headache and vision changes, or if symptoms are not better after completion of antibiotic, please schedule an appointment with a health care provider.   ? ?Sinus infections are not as easily transmitted as other respiratory infection, however we still recommend that you avoid close contact with loved ones, especially the very young and elderly.  Remember to wash your hands thoroughly throughout the day as this is the number one way to prevent the spread of infection! ? ?Home Care: ?Only take medications as instructed by your medical team. ?Complete the entire course of an antibiotic. ?Do not take these medications with alcohol. ?A steam or ultrasonic humidifier can help congestion.  You can place a towel over your head and breathe in the steam from hot water coming from a faucet. ?Avoid close contacts especially the very young and the elderly. ?Cover your mouth when you cough or sneeze. ?Always remember to wash your hands. ? ?Get Help Right Away If: ?You develop worsening fever or sinus pain. ?You develop a severe head ache or visual changes. ?Your symptoms persist after you have completed your treatment plan. ? ?Make sure you ?Understand these instructions. ?Will watch your  condition. ?Will get help right away if you are not doing well or get worse. ? ?Thank you for choosing an e-visit. ? ?Your e-visit answers were reviewed by a board certified advanced clinical practitioner to complete your personal care plan. Depending upon the condition, your plan could have included both over the counter or prescription medications. ? ?Please review your pharmacy choice. Make sure the pharmacy is open so you can pick up prescription now. If there is a problem, you may contact your provider through [employer] and have the prescription routed to another pharmacy.  Your safety is important to [place]. If you have drug allergies check your prescription carefully.  ? ?For the next 24 hours you can use MyChart to ask questions about today's visit, request a non-urgent call back, or ask for a work or school excuse. ?You will get an email in the next two days asking about your experience. I hope that your e-visit has been valuable and will speed your recovery. ? ?Greater than 5 minutes, yet less than 10 minutes of time have been spent researching, coordinating, and implementing care for this patient today ? ?

## 2021-09-10 ENCOUNTER — Other Ambulatory Visit (HOSPITAL_BASED_OUTPATIENT_CLINIC_OR_DEPARTMENT_OTHER): Payer: Self-pay

## 2021-09-10 MED ORDER — FLUCONAZOLE 150 MG PO TABS
150.0000 mg | ORAL_TABLET | Freq: Once | ORAL | 0 refills | Status: AC
  Filled 2021-09-10: qty 1, 1d supply, fill #0

## 2021-09-10 NOTE — Addendum Note (Signed)
Addended by: Perlie Mayo on: 09/10/2021 07:43 AM ? ? Modules accepted: Orders ? ?

## 2021-11-20 ENCOUNTER — Encounter: Payer: Self-pay | Admitting: Internal Medicine

## 2021-11-24 ENCOUNTER — Encounter: Payer: Self-pay | Admitting: Medical

## 2021-11-24 ENCOUNTER — Ambulatory Visit: Payer: 59 | Admitting: Medical

## 2021-11-24 ENCOUNTER — Other Ambulatory Visit (HOSPITAL_BASED_OUTPATIENT_CLINIC_OR_DEPARTMENT_OTHER): Payer: Self-pay

## 2021-11-24 VITALS — BP 129/80 | HR 86 | Temp 98.2°F | Resp 18 | Ht 64.0 in | Wt 193.0 lb

## 2021-11-24 DIAGNOSIS — E010 Iodine-deficiency related diffuse (endemic) goiter: Secondary | ICD-10-CM

## 2021-11-24 DIAGNOSIS — E559 Vitamin D deficiency, unspecified: Secondary | ICD-10-CM | POA: Diagnosis not present

## 2021-11-24 DIAGNOSIS — R768 Other specified abnormal immunological findings in serum: Secondary | ICD-10-CM | POA: Diagnosis not present

## 2021-11-24 DIAGNOSIS — R5383 Other fatigue: Secondary | ICD-10-CM | POA: Diagnosis not present

## 2021-11-24 DIAGNOSIS — M2550 Pain in unspecified joint: Secondary | ICD-10-CM

## 2021-11-24 MED ORDER — BUPROPION HCL ER (XL) 150 MG PO TB24
150.0000 mg | ORAL_TABLET | Freq: Every day | ORAL | 0 refills | Status: DC
  Filled 2021-11-24: qty 30, 30d supply, fill #0

## 2021-11-24 MED ORDER — ALPRAZOLAM 1 MG PO TABS
ORAL_TABLET | ORAL | 0 refills | Status: DC
  Filled 2021-11-24: qty 10, 10d supply, fill #0

## 2021-11-24 MED ORDER — FLUTICASONE PROPIONATE 50 MCG/ACT NA SUSP
2.0000 | Freq: Every day | NASAL | 1 refills | Status: DC
  Filled 2021-11-24: qty 16, 30d supply, fill #0

## 2021-11-24 NOTE — Patient Instructions (Addendum)
For anxiety, depressed mood and attention concerns prescribing xanax and wellbutrin. Will see how you do with meds. If anxiety controlled with xanax then have you sign controlled med contract and give uds.  For fatigue will get below listed lab.  For vit D deficiency get vit D level.  For possible mild enlarged thyroid will get thyroid US, tsh and t4.(Also ordered thyroid US)  Probable allergic rhinitis with rt side eustachian tube dysfunction. Rx flonase nasal spray.  Follow up one month or sooner if needed.

## 2021-11-24 NOTE — Progress Notes (Signed)
Subjective:    Patient ID: Nicole Donaldson, female    DOB: 06-01-1976, 46 y.o.   MRN: [id number]  HPI  Pt is anxious frequently recently. Stress with family and at work. Gad 7 score is 12. Depression score phq-9 14. Pt in past had mentioned ADD and I had used low dose wellbutrin. On discussion willing to retry for mood and possible better attention.  Pt in past had been on effexor in 2021.   Pt had used xanax in the past. She thinks worked better than buspar.   Pt reports feeling fatigued some daily.  Pt feels full sensation to rt side of neck. Pt thought her thyroid feels little large. Pt state mom had goiter and sister has nodules.  Recent mild nasal congestion with rt ear pressure.  Lmp- 1st day Saturday.  Review of Systems  Constitutional:  Positive for fatigue. Negative for chills and fever.  HENT:  Positive for congestion and ear pain.   Respiratory:  Negative for chest tightness and wheezing.   Cardiovascular:  Negative for chest pain and palpitations.  Gastrointestinal:  Negative for abdominal pain.  Endocrine: Negative for polyphagia.  Genitourinary:  Negative for dysuria, flank pain and frequency.  Musculoskeletal:  Negative for back pain.       See hpi.  Neurological:  Negative for dizziness and headaches.  Hematological:  Negative for adenopathy. Does not bruise/bleed easily.  Psychiatric/Behavioral:  The patient is nervous/anxious.     Past Medical History:  Diagnosis Date   Anemia    Asthma    childhood exercise induced   Enlarged thyroid    during pregnancy   Gestational diabetes    diet controlled   Headache    Migraines   History of blood transfusion    History of hiatal hernia    No pertinent past medical history    Pinched nerve in neck      Social History   Socioeconomic History   Marital status: Single    Spouse name: Not on file   Number of children: Not on file   Years of education: Not on file   Highest education level: Not on file   Occupational History   Not on file  Tobacco Use   Smoking status: Never   Smokeless tobacco: Never  Vaping Use   Vaping Use: Never used  Substance and Sexual Activity   Alcohol use: No    Alcohol/week: 0.0 standard drinks   Drug use: No   Sexual activity: Never    Birth control/protection: Pill  Other Topics Concern   Not on file  Social History Narrative   Not on file   Social Determinants of Health   Financial Resource Strain: Not on file  Food Insecurity: Not on file  Transportation Needs: Not on file  Physical Activity: Not on file  Stress: Not on file  Social Connections: Not on file  Intimate Partner Violence: Not on file    Past Surgical History:  Procedure Laterality Date   COLONOSCOPY     DILATATION & CURETTAGE/HYSTEROSCOPY WITH MYOSURE N/A 03/19/2016   Procedure: [place];  Surgeon: Servando Salina, MD;  Location: [facility];  Service: Gynecology;  Laterality: N/A;   NO PAST SURGERIES     UPPER GI ENDOSCOPY      Family History  Problem Relation Age of Onset   Cancer Mother        breast   Hypertension Mother    Thyroid disease Mother  Breast cancer Mother 81   Early death Father    Cancer Maternal Grandmother        bladder   Diabetes Maternal Grandmother    Hypertension Maternal Grandmother    COPD Maternal Grandfather    Heart disease Maternal Grandfather     Allergies  Allergen Reactions   Clarithromycin Swelling    Lip swelling   Sudafed [Pseudoephedrine Hcl] Swelling    Lip swelling    Current Outpatient Medications on File Prior to Visit  Medication Sig Dispense Refill   Colchicine 0.6 MG CAPS take 1 capsule by mouth daily 60 capsule 10   colchicine 0.6 MG tablet Take 0.6 mg by mouth daily.     colchicine 0.6 MG tablet Take 1 tablet by mouth daily. 60 tablet 11   colchicine 0.6 MG tablet Take 1 tablet (0.6 mg total) by mouth daily. 60 tablet 11   EPINEPHrine (EPIPEN 2-PAK) 0.3 mg/0.3 mL IJ  SOAJ injection Inject 0.3 mLs (0.3 mg total) into the muscle as needed for anaphylaxis. 1 each 2   fluticasone (FLONASE) 50 MCG/ACT nasal spray Place 1 spray into each nostril 1 - 2 times daily as needed 16 g 0   hydrOXYzine (ATARAX/VISTARIL) 25 MG tablet Take 1 tablet (25 mg total) by mouth every 8 (eight) hours as needed for itching. 30 tablet 0   ibuprofen (ADVIL) 800 MG tablet Take 800 mg by mouth every 8 (eight) hours as needed.     levocetirizine (XYZAL) 5 MG tablet Take 1 tablet (5 mg total) by mouth daily as needed for allergies. 30 tablet 4   loratadine (CLARITIN) 10 MG tablet Take 10 mg by mouth daily as needed for allergies.     Olopatadine HCl (PATADAY) 0.2 % SOLN Apply 1 drop to eye daily as needed. 2.5 mL 4   venlafaxine XR (EFFEXOR XR) 37.5 MG 24 hr capsule Take 1 capsule (37.5 mg total) by mouth daily with breakfast. 30 capsule 3   Vitamin D, Ergocalciferol, (DRISDOL) 1.25 MG (50000 UNIT) CAPS capsule Take 1 capsule by mouth once a week 4 capsule 4   No current facility-administered medications on file prior to visit.    BP 129/80   Pulse 86   Temp 98.2 F (36.8 C)   Resp 18   Ht '5\' 4"'$  (1.626 m)   Wt 193 lb (87.5 kg)   SpO2 99%   BMI 33.13 kg/m       Objective:   Physical Exam   General- No acute distress. Pleasant patient. Neck- Full range of motion, no jvd. Possible faint enlarged rt side of thyroid.  Lungs- Clear, even and unlabored. Heart- regular rate and rhythm. Neurologic- CNII- XII grossly intact.  Heent- normal.       Assessment & Plan:   Patient Instructions  For anxiety, depressed mood and attention concerns prescribing xanax and wellbutrin. Will see how you do with meds. If anxiety controlled with xanax then have you sign controlled med contract and give uds.  For fatigue will get below listed lab.  For vit D deficiency get vit D level.  For possible mild enlarged thyroid will get thyroid US, tsh and t4.  Probable allergic rhinitis with rt  side eustachian tube dysfunction. Rx flonase nasal spray.  Follow up one month or sooner if needed.     Mackie Pai, PA-C

## 2021-11-25 LAB — COMPREHENSIVE METABOLIC PANEL
ALT: 11 U/L (ref 0–35)
AST: 15 U/L (ref 0–37)
Albumin: 4.4 g/dL (ref 3.5–5.2)
Alkaline Phosphatase: 86 U/L (ref 39–117)
BUN: 10 mg/dL (ref 6–23)
CO2: 28 mEq/L (ref 19–32)
Calcium: 9.7 mg/dL (ref 8.4–10.5)
Chloride: 102 mEq/L (ref 96–112)
Creatinine, Ser: 0.81 mg/dL (ref 0.40–1.20)
GFR: 87.71 mL/min (ref >60.00)
Glucose, Bld: 95 mg/dL (ref 70–99)
Potassium: 4 mEq/L (ref 3.5–5.1)
Sodium: 140 mEq/L (ref 135–145)
Total Bilirubin: 0.3 mg/dL (ref 0.2–1.2)
Total Protein: 7.8 g/dL (ref 6.0–8.3)

## 2021-11-25 LAB — CBC WITH DIFFERENTIAL/PLATELET
Basophils Absolute: 0.1 10*3/uL (ref 0.0–0.1)
Basophils Relative: 1.2 % (ref 0.0–3.0)
Eosinophils Absolute: 0.1 10*3/uL (ref 0.0–0.7)
Eosinophils Relative: 1.9 % (ref 0.0–5.0)
HCT: 37.1 % (ref 36.0–46.0)
Hemoglobin: 11.7 g/dL — ABNORMAL LOW (ref 12.0–15.0)
Lymphocytes Relative: 29.8 % (ref 12.0–46.0)
Lymphs Abs: 1.7 10*3/uL (ref 0.7–4.0)
MCHC: 31.6 g/dL (ref 30.0–36.0)
MCV: 83.2 fl (ref 78.0–100.0)
Monocytes Absolute: 0.5 10*3/uL (ref 0.1–1.0)
Monocytes Relative: 8.4 % (ref 3.0–12.0)
Neutro Abs: 3.4 10*3/uL (ref 1.4–7.7)
Neutrophils Relative %: 58.7 % (ref 43.0–77.0)
Platelets: 362 10*3/uL (ref 150.0–400.0)
RBC: 4.46 Mil/uL (ref 3.87–5.11)
RDW: 16 % — ABNORMAL HIGH (ref 11.5–15.5)
WBC: 5.7 10*3/uL (ref 4.0–10.5)

## 2021-11-25 LAB — VITAMIN B12: Vitamin B-12: 221 pg/mL (ref 211–911)

## 2021-11-25 LAB — TSH: TSH: 1.65 u[IU]/mL (ref 0.35–5.50)

## 2021-11-25 LAB — T4, FREE: Free T4: 0.93 ng/dL (ref 0.60–1.60)

## 2021-11-25 LAB — IRON: Iron: 38 ug/dL — ABNORMAL LOW (ref 42–145)

## 2021-11-25 LAB — VITAMIN D 25 HYDROXY (VIT D DEFICIENCY, FRACTURES): VITD: 16.98 ng/mL — ABNORMAL LOW (ref 30.00–100.00)

## 2021-11-25 MED ORDER — EPINEPHRINE 0.3 MG/0.3ML IJ SOAJ
0.3000 mg | INTRAMUSCULAR | 2 refills | Status: DC | PRN
  Filled 2021-11-25: qty 2, 2d supply, fill #0

## 2021-11-25 MED ORDER — VITAMIN D (ERGOCALCIFEROL) 1.25 MG (50000 UNIT) PO CAPS
50000.0000 [IU] | ORAL_CAPSULE | ORAL | 0 refills | Status: DC
  Filled 2021-11-25: qty 8, 56d supply, fill #0

## 2021-11-25 MED ORDER — IRON (FERROUS SULFATE) 325 (65 FE) MG PO TABS
325.0000 mg | ORAL_TABLET | Freq: Every day | ORAL | 3 refills | Status: DC
  Filled 2021-11-25: qty 100, 90d supply, fill #0
  Filled 2022-02-21: qty 100, 100d supply, fill #0

## 2021-11-25 NOTE — Addendum Note (Signed)
Addended by: Anabel Halon on: 11/25/2021 08:28 PM   Modules accepted: Orders

## 2021-11-25 NOTE — Telephone Encounter (Signed)
Caller Name Kisa Fujii Phone Number [phone] Patient Name Nicole Donaldson Patient DOB 14-Dec-1975 Call Type Message Only Information Provided Reason for Call Request for General Office Information Initial Comment Caller is calling to ask about the status of her referral. Disp. Time Disposition Final User 11/24/2021 5:30:21 PM General Information Provided Yes Alexis Frock Call Closed By: Alexis Frock Transaction Date/Time: 11/24/2021 5:25:47 PM (ET)

## 2021-11-25 NOTE — Addendum Note (Signed)
Addended by: Anabel Halon on: 11/25/2021 09:18 PM   Modules accepted: Orders

## 2021-11-25 NOTE — Addendum Note (Signed)
Addended by: Anabel Halon on: 11/25/2021 08:23 PM   Modules accepted: Orders

## 2021-11-26 ENCOUNTER — Other Ambulatory Visit (HOSPITAL_BASED_OUTPATIENT_CLINIC_OR_DEPARTMENT_OTHER): Payer: Self-pay

## 2021-11-27 ENCOUNTER — Ambulatory Visit: Payer: 59 | Admitting: Medical

## 2021-11-30 LAB — VITAMIN B1: Vitamin B1 (Thiamine): 8 nmol/L (ref 8–30)

## 2021-12-01 ENCOUNTER — Other Ambulatory Visit (HOSPITAL_BASED_OUTPATIENT_CLINIC_OR_DEPARTMENT_OTHER): Payer: Self-pay

## 2021-12-04 ENCOUNTER — Encounter: Payer: Self-pay | Admitting: Medical

## 2021-12-04 ENCOUNTER — Other Ambulatory Visit: Payer: 59

## 2021-12-04 ENCOUNTER — Ambulatory Visit (INDEPENDENT_AMBULATORY_CARE_PROVIDER_SITE_OTHER): Payer: 59

## 2021-12-04 DIAGNOSIS — E010 Iodine-deficiency related diffuse (endemic) goiter: Secondary | ICD-10-CM

## 2021-12-04 DIAGNOSIS — E041 Nontoxic single thyroid nodule: Secondary | ICD-10-CM | POA: Diagnosis not present

## 2021-12-07 ENCOUNTER — Encounter: Payer: Self-pay | Admitting: Medical

## 2021-12-11 HISTORY — PX: BREAST BIOPSY: SHX20

## 2021-12-15 ENCOUNTER — Ambulatory Visit (HOSPITAL_BASED_OUTPATIENT_CLINIC_OR_DEPARTMENT_OTHER)
Admission: RE | Admit: 2021-12-15 | Discharge: 2021-12-15 | Disposition: A | Discharge status: Home / Self Care | Payer: 59 | Source: Ambulatory Visit | Attending: Obstetrics and Gynecology | Admitting: Obstetrics and Gynecology

## 2021-12-15 ENCOUNTER — Encounter (HOSPITAL_BASED_OUTPATIENT_CLINIC_OR_DEPARTMENT_OTHER): Payer: Self-pay

## 2021-12-15 ENCOUNTER — Other Ambulatory Visit (HOSPITAL_BASED_OUTPATIENT_CLINIC_OR_DEPARTMENT_OTHER): Payer: Self-pay | Admitting: Obstetrics and Gynecology

## 2021-12-15 ENCOUNTER — Other Ambulatory Visit (HOSPITAL_BASED_OUTPATIENT_CLINIC_OR_DEPARTMENT_OTHER): Payer: Self-pay | Admitting: Medical

## 2021-12-15 DIAGNOSIS — Z1231 Encounter for screening mammogram for malignant neoplasm of breast: Secondary | ICD-10-CM

## 2021-12-17 ENCOUNTER — Other Ambulatory Visit: Payer: Self-pay | Admitting: Obstetrics and Gynecology

## 2021-12-17 ENCOUNTER — Other Ambulatory Visit (HOSPITAL_BASED_OUTPATIENT_CLINIC_OR_DEPARTMENT_OTHER): Payer: Self-pay

## 2021-12-17 ENCOUNTER — Ambulatory Visit: Payer: 59 | Admitting: Family Medicine

## 2021-12-17 ENCOUNTER — Encounter: Payer: Self-pay | Admitting: Family Medicine

## 2021-12-17 VITALS — BP 115/64 | HR 83 | Temp 97.7°F | Ht 64.0 in | Wt 197.6 lb

## 2021-12-17 DIAGNOSIS — R221 Localized swelling, mass and lump, neck: Secondary | ICD-10-CM

## 2021-12-17 DIAGNOSIS — R928 Other abnormal and inconclusive findings on diagnostic imaging of breast: Secondary | ICD-10-CM

## 2021-12-17 MED ORDER — PREDNISONE 20 MG PO TABS
40.0000 mg | ORAL_TABLET | Freq: Every day | ORAL | 0 refills | Status: AC
  Filled 2021-12-17: qty 10, 5d supply, fill #0

## 2021-12-17 MED ORDER — FLUCONAZOLE 150 MG PO TABS
150.0000 mg | ORAL_TABLET | Freq: Once | ORAL | 1 refills | Status: AC
  Filled 2021-12-17: qty 2, 3d supply, fill #0
  Filled 2022-01-11: qty 2, 3d supply, fill #1

## 2021-12-17 MED ORDER — AMOXICILLIN-POT CLAVULANATE 875-125 MG PO TABS
1.0000 | ORAL_TABLET | Freq: Two times a day (BID) | ORAL | 0 refills | Status: AC
  Filled 2021-12-17: qty 20, 10d supply, fill #0

## 2021-12-17 NOTE — Patient Instructions (Signed)
Given the pain and swelling, let's try steroids and antibiotics. Ultrasound was unremarkable. Will refer to ENT to further evaluate and consider advanced imaging.   Please contact office for follow-up if symptoms do not improve or worsen. Seek emergency care if symptoms become severe.

## 2021-12-17 NOTE — Progress Notes (Signed)
Acute Office Visit  Subjective:     Patient ID: Nicole Donaldson, female    DOB: [birth year], 46 y.o.   MRN: [id number]  CC: neck swelling    HPI Patient is in today for left neck pain/swelling.  Patient was seen in our office by Mackie Pai, PA at the end of May with some right-sided neck discomfort/swelling, feeling of having something stuck in her throat or behind her thyroid area.  The discomfort was eventually starting to affect her voice and swallowing.  She also reported significant family history for thyroid disorder.  She had normal blood work and normal thyroid ultrasound.  States symptoms went away for about a week or so but then earlier this week symptoms returned however now on the left side of her neck and moving up to left ear/jaw.  Patient reports she is noticed swelling/tightness to her left upper neck, coworker told her this could be where her parotid gland is.  Reports the pain was quite significant last night, up to 7/10.  She has been getting some relief with Tylenol/ibuprofen however it does hurt to chew and open her mouth wide due to the swelling.  She has not noticed any discoloration, warmth to the area.  She has not had any fevers, chills, body aches, rashes.    ROS All review of systems negative except what is listed in the HPI      Objective:    BP 115/64   Pulse 83   Temp 97.7 F (36.5 C)   Ht '5\' 4"'$  (1.626 m)   Wt 197 lb 9.6 oz (89.6 kg)   LMP 11/23/2021   BMI 33.92 kg/m    Physical Exam Vitals reviewed.  Constitutional:      Appearance: Normal appearance.  HENT:     Right Ear: Tympanic membrane normal.     Left Ear: Tympanic membrane normal.     Mouth/Throat:     Mouth: Mucous membranes are moist.     Pharynx: Oropharynx is clear. No oropharyngeal exudate or posterior oropharyngeal erythema.  Neck:   Pulmonary:     Effort: No respiratory distress.  Musculoskeletal:     Cervical back: Normal range of motion and neck supple. Tenderness  present.  Lymphadenopathy:     Cervical: Cervical adenopathy present.  Skin:    General: Skin is warm and dry.     Findings: No erythema.  Neurological:     General: No focal deficit present.     Mental Status: She is alert and oriented to person, place, and time. Mental status is at baseline.  Psychiatric:        Mood and Affect: Mood normal.        Behavior: Behavior normal.        Thought Content: Thought content normal.        Judgment: Judgment normal.       No results found for any visits on 12/17/21.      Assessment & Plan:   1. Localized swelling, mass and lump, neck Parotid area with the most notable edema, but radiating tenderness/stiffness. Will start with ABX and steroids along with ENT referral. Neck/thyroid US recently (borderline thyromegaly with small nodules). If no improvement with this plan and delayed ENT appointment, consider advanced imaging.  Patient aware of signs/symptoms requiring further/urgent evaluation.   - Ambulatory referral to ENT - fluconazole (DIFLUCAN) 150 MG tablet; Take 1 tablet (150 mg total) by mouth once for 1 dose. Repeat in 72 hours if symptoms  persist.  Dispense: 2 tablet; Refill: 1 - amoxicillin-clavulanate (AUGMENTIN) 875-125 MG tablet; Take 1 tablet by mouth 2 (two) times daily for 10 days.  Dispense: 20 tablet; Refill: 0 - predniSONE (DELTASONE) 20 MG tablet; Take 2 tablets (40 mg total) by mouth daily with breakfast for 5 days.  Dispense: 10 tablet; Refill: 0   Return if symptoms worsen or fail to improve.  Terrilyn Saver, NP

## 2021-12-18 ENCOUNTER — Encounter: Payer: Self-pay | Admitting: Medical

## 2021-12-18 ENCOUNTER — Other Ambulatory Visit: Payer: Self-pay

## 2021-12-18 ENCOUNTER — Other Ambulatory Visit: Payer: Self-pay | Admitting: Medical

## 2021-12-24 ENCOUNTER — Ambulatory Visit
Admission: RE | Admit: 2021-12-24 | Discharge: 2021-12-24 | Disposition: A | Discharge status: Home / Self Care | Payer: 59 | Source: Ambulatory Visit | Attending: Obstetrics and Gynecology | Admitting: Obstetrics and Gynecology

## 2021-12-24 ENCOUNTER — Other Ambulatory Visit: Payer: Self-pay | Admitting: Obstetrics and Gynecology

## 2021-12-24 DIAGNOSIS — R928 Other abnormal and inconclusive findings on diagnostic imaging of breast: Secondary | ICD-10-CM

## 2021-12-24 DIAGNOSIS — Z803 Family history of malignant neoplasm of breast: Secondary | ICD-10-CM | POA: Diagnosis not present

## 2021-12-24 DIAGNOSIS — N6313 Unspecified lump in the right breast, lower outer quadrant: Secondary | ICD-10-CM | POA: Diagnosis not present

## 2021-12-28 ENCOUNTER — Other Ambulatory Visit (HOSPITAL_BASED_OUTPATIENT_CLINIC_OR_DEPARTMENT_OTHER): Payer: Self-pay

## 2021-12-28 ENCOUNTER — Other Ambulatory Visit: Payer: Self-pay | Admitting: Allergy and Immunology

## 2022-01-01 ENCOUNTER — Ambulatory Visit
Admission: RE | Admit: 2022-01-01 | Discharge: 2022-01-01 | Disposition: A | Discharge status: Home / Self Care | Payer: 59 | Source: Ambulatory Visit | Attending: Obstetrics and Gynecology | Admitting: Obstetrics and Gynecology

## 2022-01-01 DIAGNOSIS — N62 Hypertrophy of breast: Secondary | ICD-10-CM | POA: Diagnosis not present

## 2022-01-01 DIAGNOSIS — R928 Other abnormal and inconclusive findings on diagnostic imaging of breast: Secondary | ICD-10-CM

## 2022-01-01 DIAGNOSIS — N6313 Unspecified lump in the right breast, lower outer quadrant: Secondary | ICD-10-CM | POA: Diagnosis not present

## 2022-01-11 ENCOUNTER — Other Ambulatory Visit (HOSPITAL_BASED_OUTPATIENT_CLINIC_OR_DEPARTMENT_OTHER): Payer: Self-pay

## 2022-01-12 ENCOUNTER — Encounter: Payer: Self-pay | Admitting: Family Medicine

## 2022-01-12 ENCOUNTER — Ambulatory Visit: Payer: 59 | Admitting: Family Medicine

## 2022-01-12 ENCOUNTER — Other Ambulatory Visit: Payer: Self-pay | Admitting: Medical

## 2022-01-12 ENCOUNTER — Other Ambulatory Visit (HOSPITAL_BASED_OUTPATIENT_CLINIC_OR_DEPARTMENT_OTHER): Payer: Self-pay

## 2022-01-12 VITALS — BP 110/73 | HR 76 | Temp 97.8°F | Ht 64.0 in | Wt 196.2 lb

## 2022-01-12 DIAGNOSIS — N644 Mastodynia: Secondary | ICD-10-CM | POA: Diagnosis not present

## 2022-01-12 MED ORDER — CEPHALEXIN 500 MG PO CAPS
500.0000 mg | ORAL_CAPSULE | Freq: Two times a day (BID) | ORAL | 0 refills | Status: DC
  Filled 2022-01-12: qty 14, 7d supply, fill #0

## 2022-01-12 NOTE — Patient Instructions (Addendum)
Possibly a hematoma from the procedure. Per guidelines, it can take 4-6 weeks for this to resolve. Recommendations include: Cold compresses for the first 48 hours Tylenol instead of NSAIDs If you develop signs of infection, you can start the antibiotic (warmth, redness, swelling, fevers, chills, body aches). Incision itself looks okay today.  If you the area seems to be rapidly growing, more firm, severe pain, nipple discharge, breast appears asymmetrical, new/worse bruising, then go back to the breast center for further workup and treatment.

## 2022-01-12 NOTE — Progress Notes (Unsigned)
   Acute Office Visit  Subjective:     Patient ID: Nicole Donaldson, female    DOB: 1975-11-09, 46 y.o.   MRN: [id number]  No chief complaint on file.   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  Area does felel slightly warm, cannot tel lif shes swollen but it does feel fuller and  Shooting/throbbing pain into R nipple   No redness, rashes, drainage, fevers, chills, body aches    ROS      Objective:    BP 110/73   Pulse 76   Temp 97.8 F (36.6 C)   Ht '5\' 4"'$  (1.626 m)   Wt 196 lb 3.2 oz (89 kg)   LMP 11/23/2021   BMI 33.68 kg/m  {Vitals History (Optional):23777}  Physical Exam Vitals reviewed. Exam conducted with a chaperone present.  HENT:     Head: Normocephalic and atraumatic.  Chest:    Lymphadenopathy:     Upper Body:     Right upper body: No axillary or pectoral adenopathy.     Left upper body: No pectoral adenopathy.  Skin:    General: Skin is warm and dry.     Findings: Bruising present.  Neurological:     General: No focal deficit present.     Mental Status: She is alert and oriented to person, place, and time. Mental status is at baseline.  Psychiatric:        Mood and Affect: Mood normal.        Behavior: Behavior normal.        Thought Content: Thought content normal.         Judgment: Judgment normal.     No results found for any visits on 01/12/22.      Assessment & Plan:   Problem List Items Addressed This Visit   None   No orders of the defined types were placed in this encounter.   No follow-ups on file.  TTerrilyn Saver NP

## 2022-01-13 ENCOUNTER — Encounter: Payer: Self-pay | Admitting: Medical

## 2022-01-13 ENCOUNTER — Encounter: Payer: Self-pay | Admitting: Family Medicine

## 2022-01-13 MED ORDER — ALPRAZOLAM 1 MG PO TABS
0.5000 mg | ORAL_TABLET | Freq: Every day | ORAL | 0 refills | Status: DC | PRN
  Filled 2022-01-13 – 2022-02-03 (×2): qty 4, 4d supply, fill #0

## 2022-01-13 NOTE — Telephone Encounter (Addendum)
Requesting: alprazolam '1mg'$   Contract: 10/27/2018 UDS: 10/27/2018 Last Visit: 11/24/21, saw Lovena Le 01/12/22 Next Visit: None Last Refill: 11/24/21 #10 and 0RF  Please Advise  Some time since I have seen pt for anxiety. Sent in only 4 tab prescription. Advise pt she would need to have office visit with me for any further refills. Offer her appointment.  Thanks

## 2022-01-14 ENCOUNTER — Other Ambulatory Visit (HOSPITAL_BASED_OUTPATIENT_CLINIC_OR_DEPARTMENT_OTHER): Payer: Self-pay

## 2022-01-25 ENCOUNTER — Other Ambulatory Visit (HOSPITAL_BASED_OUTPATIENT_CLINIC_OR_DEPARTMENT_OTHER): Payer: Self-pay

## 2022-02-03 ENCOUNTER — Other Ambulatory Visit: Payer: Self-pay | Admitting: Medical

## 2022-02-03 ENCOUNTER — Encounter: Payer: Self-pay | Admitting: Family Medicine

## 2022-02-03 ENCOUNTER — Other Ambulatory Visit (HOSPITAL_BASED_OUTPATIENT_CLINIC_OR_DEPARTMENT_OTHER): Payer: Self-pay

## 2022-02-03 ENCOUNTER — Other Ambulatory Visit: Payer: Self-pay | Admitting: Family Medicine

## 2022-02-03 DIAGNOSIS — N644 Mastodynia: Secondary | ICD-10-CM

## 2022-02-03 MED ORDER — BUPROPION HCL ER (XL) 150 MG PO TB24
150.0000 mg | ORAL_TABLET | Freq: Every day | ORAL | 0 refills | Status: DC
  Filled 2022-02-03: qty 30, 30d supply, fill #0

## 2022-02-11 ENCOUNTER — Ambulatory Visit
Admission: RE | Admit: 2022-02-11 | Discharge: 2022-02-11 | Disposition: A | Discharge status: Home / Self Care | Payer: 59 | Source: Ambulatory Visit | Attending: Family Medicine | Admitting: Family Medicine

## 2022-02-11 DIAGNOSIS — R339 Retention of urine, unspecified: Secondary | ICD-10-CM | POA: Diagnosis not present

## 2022-02-11 DIAGNOSIS — N644 Mastodynia: Secondary | ICD-10-CM

## 2022-02-11 DIAGNOSIS — Z01419 Encounter for gynecological examination (general) (routine) without abnormal findings: Secondary | ICD-10-CM | POA: Diagnosis not present

## 2022-02-11 DIAGNOSIS — R87810 Cervical high risk human papillomavirus (HPV) DNA test positive: Secondary | ICD-10-CM | POA: Diagnosis not present

## 2022-02-11 DIAGNOSIS — R928 Other abnormal and inconclusive findings on diagnostic imaging of breast: Secondary | ICD-10-CM | POA: Diagnosis not present

## 2022-02-21 ENCOUNTER — Other Ambulatory Visit: Payer: Self-pay | Admitting: Medical

## 2022-02-22 ENCOUNTER — Other Ambulatory Visit (HOSPITAL_BASED_OUTPATIENT_CLINIC_OR_DEPARTMENT_OTHER): Payer: Self-pay

## 2022-02-22 MED ORDER — ALPRAZOLAM 1 MG PO TABS
0.5000 mg | ORAL_TABLET | Freq: Every day | ORAL | 0 refills | Status: DC | PRN
  Filled 2022-02-22: qty 4, 4d supply, fill #0

## 2022-02-22 NOTE — Telephone Encounter (Signed)
Requesting: alprazolam '1mg'$   Contract: 10/27/2018 UDS: 10/27/2018 Last Visit: 01/12/22 w/ Lovena Le Next Visit: None Last Refill: 01/13/22 #4 and 0RF  Please Advise

## 2022-02-26 ENCOUNTER — Other Ambulatory Visit (HOSPITAL_BASED_OUTPATIENT_CLINIC_OR_DEPARTMENT_OTHER): Payer: Self-pay

## 2022-02-26 ENCOUNTER — Ambulatory Visit: Payer: 59 | Admitting: Family Medicine

## 2022-02-26 ENCOUNTER — Encounter: Payer: Self-pay | Admitting: Family Medicine

## 2022-02-26 VITALS — BP 108/78 | HR 97 | Temp 98.1°F | Ht 65.0 in | Wt 195.4 lb

## 2022-02-26 DIAGNOSIS — M898X1 Other specified disorders of bone, shoulder: Secondary | ICD-10-CM | POA: Diagnosis not present

## 2022-02-26 MED ORDER — TRAMADOL HCL 50 MG PO TABS
50.0000 mg | ORAL_TABLET | Freq: Three times a day (TID) | ORAL | 0 refills | Status: AC | PRN
  Filled 2022-02-26: qty 20, 7d supply, fill #0

## 2022-02-26 NOTE — Patient Instructions (Addendum)
Ice/cold pack over area for 10-15 min twice daily.  OK to take Tylenol 1000 mg (2 extra strength tabs) or 975 mg (3 regular strength tabs) every 6 hours as needed.  Ibuprofen 400-600 mg (2-3 over the counter strength tabs) every 6 hours as needed for pain.  Heat (pad or rice pillow in microwave) over affected area, 10-15 minutes twice daily.   Let us know if you need anything.

## 2022-02-26 NOTE — Progress Notes (Signed)
Musculoskeletal Exam  Patient: Nicole Donaldson DOB: 1976/05/21  DOS: 02/26/2022  SUBJECTIVE:  Chief Complaint:   Chief Complaint  Patient presents with   Follow-up    Injured collar bone    Nicole Donaldson is a 46 y.o.  female for evaluation and treatment of R collar bone pain.   Onset: 6 days ago.  She was in the batting cage around 6 days ago and had some pain in the right shoulder radiating through her collarbone.  2 days ago, she put all her weight down on a double door lock and felt pain over the collarbone area. Location: Medial collar bone  Character:  burning and throbbing  Progression of issue:  has worsened Associated symptoms: pain limited ROM, protrusion on R No bruising, swelling, redness Treatment: to date has been ice and OTC NSAIDS.   Neurovascular symptoms: no  Past Medical History:  Diagnosis Date   Anemia    Asthma    childhood exercise induced   Enlarged thyroid    during pregnancy   Gestational diabetes    diet controlled   Headache    Migraines   History of blood transfusion    History of hiatal hernia    No pertinent past medical history    Pinched nerve in neck     Objective: VITAL SIGNS: BP 108/78   Pulse 97   Temp 98.1 F (36.7 C) (Oral)   Ht '5\' 5"'$  (1.651 m)   Wt 195 lb 6 oz (88.6 kg)   SpO2 99%   BMI 32.51 kg/m  Constitutional: Well formed, well developed. No acute distress. Thorax & Lungs: No accessory muscle use Musculoskeletal: Right shoulder.   She has decreased active and passive range of motion of her right shoulder Tenderness to palpation: Yes over the right SCM joint and medial clavicle There is also tenderness over the right trapezius muscle. Deformity: The medial right clavicle is more prominent than the left Ecchymosis: no Tests positive: none Tests negative: Neer's, Hawkins, crossover, liftoff, O'Briens did not cause pain for the respective muscle/tendons of a test for but did cause pain over the [facility] joint Neurologic:  Normal sensory function. No focal deficits noted. Psychiatric: Normal mood. Age appropriate judgment and insight. Alert & oriented x 3.    Assessment:  Pain of right clavicle - Plan: traMADol (ULTRAM) 50 MG tablet, DG [place] [facility]  Plan: Stretches/exercises, heat, ice, Tylenol. Ultram prn.  Warnings about this medication verbalized.  X-ray as above.  May need PT vs specialty referral.  F/u as originally scheduled with her regular PCP. The patient voiced understanding and agreement to the plan.   [place], DO 02/26/22  4:17 PM

## 2022-03-02 ENCOUNTER — Other Ambulatory Visit: Payer: Self-pay | Admitting: Family Medicine

## 2022-03-02 ENCOUNTER — Ambulatory Visit (INDEPENDENT_AMBULATORY_CARE_PROVIDER_SITE_OTHER): Payer: 59

## 2022-03-02 DIAGNOSIS — M898X1 Other specified disorders of bone, shoulder: Secondary | ICD-10-CM

## 2022-03-02 DIAGNOSIS — T1490XA Injury, unspecified, initial encounter: Secondary | ICD-10-CM

## 2022-03-02 DIAGNOSIS — M25511 Pain in right shoulder: Secondary | ICD-10-CM | POA: Diagnosis not present

## 2022-03-02 DIAGNOSIS — R0781 Pleurodynia: Secondary | ICD-10-CM | POA: Diagnosis not present

## 2022-03-02 DIAGNOSIS — S4991XA Unspecified injury of right shoulder and upper arm, initial encounter: Secondary | ICD-10-CM

## 2022-03-03 ENCOUNTER — Encounter: Payer: Self-pay | Admitting: Family Medicine

## 2022-03-04 ENCOUNTER — Other Ambulatory Visit (HOSPITAL_BASED_OUTPATIENT_CLINIC_OR_DEPARTMENT_OTHER): Payer: Self-pay

## 2022-03-04 ENCOUNTER — Other Ambulatory Visit: Payer: Self-pay | Admitting: Family Medicine

## 2022-03-04 ENCOUNTER — Other Ambulatory Visit (HOSPITAL_COMMUNITY): Payer: Self-pay

## 2022-03-04 MED ORDER — TIZANIDINE HCL 4 MG PO TABS
4.0000 mg | ORAL_TABLET | Freq: Four times a day (QID) | ORAL | 0 refills | Status: AC | PRN
  Filled 2022-03-04: qty 30, 8d supply, fill #0

## 2022-03-05 ENCOUNTER — Ambulatory Visit: Payer: 59 | Admitting: Medical

## 2022-03-09 ENCOUNTER — Telehealth: Payer: Self-pay | Admitting: Medical

## 2022-03-09 NOTE — Telephone Encounter (Signed)
Called the patient informed Nicole Donaldson got the stretches for her to rehab her Nicole Donaldson. Put at the front desk. Called the patient and left a detailed message to pickup at our office.

## 2022-03-11 ENCOUNTER — Other Ambulatory Visit (HOSPITAL_BASED_OUTPATIENT_CLINIC_OR_DEPARTMENT_OTHER): Payer: Self-pay

## 2022-03-12 ENCOUNTER — Ambulatory Visit: Payer: 59 | Admitting: Medical

## 2022-03-12 ENCOUNTER — Ambulatory Visit (INDEPENDENT_AMBULATORY_CARE_PROVIDER_SITE_OTHER): Payer: 59

## 2022-03-12 ENCOUNTER — Other Ambulatory Visit (HOSPITAL_BASED_OUTPATIENT_CLINIC_OR_DEPARTMENT_OTHER): Payer: Self-pay

## 2022-03-12 VITALS — BP 106/68 | HR 90 | Resp 18 | Ht 65.0 in | Wt 197.6 lb

## 2022-03-12 DIAGNOSIS — R600 Localized edema: Secondary | ICD-10-CM

## 2022-03-12 DIAGNOSIS — R0600 Dyspnea, unspecified: Secondary | ICD-10-CM

## 2022-03-12 DIAGNOSIS — R059 Cough, unspecified: Secondary | ICD-10-CM | POA: Diagnosis not present

## 2022-03-12 DIAGNOSIS — R5383 Other fatigue: Secondary | ICD-10-CM | POA: Diagnosis not present

## 2022-03-12 DIAGNOSIS — F419 Anxiety disorder, unspecified: Secondary | ICD-10-CM

## 2022-03-12 DIAGNOSIS — E559 Vitamin D deficiency, unspecified: Secondary | ICD-10-CM

## 2022-03-12 DIAGNOSIS — D649 Anemia, unspecified: Secondary | ICD-10-CM

## 2022-03-12 DIAGNOSIS — Z79899 Other long term (current) drug therapy: Secondary | ICD-10-CM

## 2022-03-12 LAB — CBC WITH DIFFERENTIAL/PLATELET
Basophils Absolute: 0 10*3/uL (ref 0.0–0.1)
Basophils Relative: 0.4 % (ref 0.0–3.0)
Eosinophils Absolute: 0.1 10*3/uL (ref 0.0–0.7)
Eosinophils Relative: 1.3 % (ref 0.0–5.0)
HCT: 34.9 % — ABNORMAL LOW (ref 36.0–46.0)
Hemoglobin: 10.9 g/dL — ABNORMAL LOW (ref 12.0–15.0)
Lymphocytes Relative: 29.2 % (ref 12.0–46.0)
Lymphs Abs: 1.5 10*3/uL (ref 0.7–4.0)
MCHC: 31.4 g/dL (ref 30.0–36.0)
MCV: 82.3 fl (ref 78.0–100.0)
Monocytes Absolute: 0.4 10*3/uL (ref 0.1–1.0)
Monocytes Relative: 8.5 % (ref 3.0–12.0)
Neutro Abs: 3 10*3/uL (ref 1.4–7.7)
Neutrophils Relative %: 60.6 % (ref 43.0–77.0)
Platelets: 340 10*3/uL (ref 150.0–400.0)
RBC: 4.24 Mil/uL (ref 3.87–5.11)
RDW: 15.6 % — ABNORMAL HIGH (ref 11.5–15.5)
WBC: 5 10*3/uL (ref 4.0–10.5)

## 2022-03-12 LAB — VITAMIN B12: Vitamin B-12: 204 pg/mL — ABNORMAL LOW (ref 211–911)

## 2022-03-12 LAB — IRON: Iron: 53 ug/dL (ref 42–145)

## 2022-03-12 LAB — VITAMIN D 25 HYDROXY (VIT D DEFICIENCY, FRACTURES): VITD: 16.9 ng/mL — ABNORMAL LOW (ref 30.00–100.00)

## 2022-03-12 LAB — BRAIN NATRIURETIC PEPTIDE: Pro B Natriuretic peptide (BNP): 8 pg/mL (ref 0.0–100.0)

## 2022-03-12 MED ORDER — BUPROPION HCL ER (XL) 300 MG PO TB24
300.0000 mg | ORAL_TABLET | Freq: Every day | ORAL | 0 refills | Status: DC
  Filled 2022-03-12: qty 30, 30d supply, fill #0

## 2022-03-12 NOTE — Progress Notes (Signed)
Subjective:    Patient ID: Nicole Donaldson, female    DOB: [birth year], 46 y.o.   MRN: [id number]  HPI  Pt in for follow up on depression and anxiety.   Last A/P  "For anxiety, depressed mood and attention concerns prescribing xanax and wellbutrin. Will see how you do with meds. If anxiety controlled with xanax then have you sign controlled med contract and give uds"  Pt states anxiety was very high after she got breast biopsy and subsequent bruising and swelling. She still has some random intermittent sharp pain around biopsy site.   Pt anxiety can be sporadic and increased at times such as above.   Some ankle swelling bilaterally over past month and half. She has gained some weight over pat 2 years.  02 at 96%. No sob lying flat on back.   Pt states no chance of pregnancy.  Review of Systems  Constitutional:  Positive for fatigue. Negative for chills.  Respiratory:  Negative for cough, chest tightness, shortness of breath and wheezing.   Cardiovascular:  Negative for chest pain and palpitations.  Gastrointestinal:  Negative for abdominal pain.  Musculoskeletal:  Negative for back pain, myalgias and neck stiffness.  Skin:  Negative for rash.  Neurological:  Negative for dizziness, speech difficulty, weakness, numbness and headaches.  Hematological:  Negative for adenopathy. Does not bruise/bleed easily.  Psychiatric/Behavioral:  Negative for behavioral problems, decreased concentration, dysphoric mood and hallucinations. The patient is nervous/anxious.    Past Medical History:  Diagnosis Date   Anemia    Asthma    childhood exercise induced   Enlarged thyroid    during pregnancy   Gestational diabetes    diet controlled   Headache    Migraines   History of blood transfusion    History of hiatal hernia    No pertinent past medical history    Pinched nerve in neck      Social History   Socioeconomic History   Marital status: Single    Spouse name: Not on file    Number of children: Not on file   Years of education: Not on file   Highest education level: Not on file  Occupational History   Not on file  Tobacco Use   Smoking status: Never   Smokeless tobacco: Never  Vaping Use   Vaping Use: Never used  Substance and Sexual Activity   Alcohol use: No    Alcohol/week: 0.0 standard drinks of alcohol   Drug use: No   Sexual activity: Never    Birth control/protection: Pill  Other Topics Concern   Not on file  Social History Narrative   Not on file   Social Determinants of Health   Financial Resource Strain: Not on file  Food Insecurity: Not on file  Transportation Needs: Not on file  Physical Activity: Not on file  Stress: Not on file  Social Connections: Not on file  Intimate Partner Violence: Not on file    Past Surgical History:  Procedure Laterality Date   COLONOSCOPY     DILATATION & CURETTAGE/HYSTEROSCOPY WITH MYOSURE N/A 03/19/2016   Procedure: [place];  Surgeon: Servando Salina, MD;  Location: [facility];  Service: Gynecology;  Laterality: N/A;   NO PAST SURGERIES     UPPER GI ENDOSCOPY      Family History  Problem Relation Age of Onset   Cancer Mother        breast   Hypertension Mother    Thyroid  disease Mother    Breast cancer Mother 81   Early death Father    Cancer Maternal Grandmother        bladder   Diabetes Maternal Grandmother    Hypertension Maternal Grandmother    COPD Maternal Grandfather    Heart disease Maternal Grandfather     Allergies  Allergen Reactions   Clarithromycin Swelling    Lip swelling   Sudafed [Pseudoephedrine Hcl] Swelling    Lip swelling    Current Outpatient Medications on File Prior to Visit  Medication Sig Dispense Refill   ALPRAZolam (XANAX) 1 MG tablet Take 1/2-1 tablet (0.5-1 mg total) by mouth daily as needed for anxiety or insomnia. 4 tablet 0   buPROPion (WELLBUTRIN XL) 150 MG 24 hr tablet Take 1 tablet (150 mg total) by mouth  daily. 30 tablet 0   colchicine 0.6 MG tablet Take 1 tablet by mouth daily. 60 tablet 11   colchicine 0.6 MG tablet Take 1 tablet (0.6 mg total) by mouth daily. 60 tablet 11   EPINEPHrine (EPIPEN 2-PAK) 0.3 mg/0.3 mL IJ SOAJ injection Inject 0.3 mg into the muscle as needed for anaphylaxis. 2 each 2   fluticasone (FLONASE) 50 MCG/ACT nasal spray Place 1 spray into each nostril 1 - 2 times daily as needed 16 g 0   fluticasone (FLONASE) 50 MCG/ACT nasal spray Place 2 sprays into both nostrils daily. 16 g 1   hydrOXYzine (ATARAX/VISTARIL) 25 MG tablet Take 1 tablet (25 mg total) by mouth every 8 (eight) hours as needed for itching. 30 tablet 0   ibuprofen (ADVIL) 800 MG tablet Take 800 mg by mouth every 8 (eight) hours as needed.     Iron, Ferrous Sulfate, 325 (65 Fe) MG TABS Take one tablet (325 mg) by mouth daily. 100 tablet 3   levocetirizine (XYZAL) 5 MG tablet Take 1 tablet (5 mg total) by mouth daily as needed for allergies. 30 tablet 4   loratadine (CLARITIN) 10 MG tablet Take 10 mg by mouth daily as needed for allergies.     Olopatadine HCl (PATADAY) 0.2 % SOLN Apply 1 drop to eye daily as needed. 2.5 mL 4   tiZANidine (ZANAFLEX) 4 MG tablet Take 1 tablet (4 mg total) by mouth every 6 (six) hours as needed for muscle spasms. 30 tablet 0   Vitamin D, Ergocalciferol, (DRISDOL) 1.25 MG (50000 UNIT) CAPS capsule Take 1 capsule (50,000 Units total) by mouth every 7 (seven) days. 8 capsule 0   No current facility-administered medications on file prior to visit.    BP 106/68   Pulse 90   Resp 18   Ht '5\' 5"'$  (1.651 m)   Wt 197 lb 9.6 oz (89.6 kg)   SpO2 96%   BMI 32.88 kg/m        Objective:   Physical Exam  General- No acute distress. Pleasant patient. Neck- Full range of motion, no jvd Lungs- Clear, even and unlabored. Heart- regular rate and rhythm. Neurologic- CNII- XII grossly intact.  Lower ext- faint 1 + pedal edema bilaterally.      Assessment & Plan:   Patient  Instructions  Anxiety- rx xanax 0.5 mg to use bid prn anxiety. #15 tab. Contract and uds today.  Some depressed mood in past and decreased concentration as well increased to 300 mg xl.  Fatigue cbc, iron, vit d and b12.  Mild pedal edema with  random mild dyspnea. Also random cough. Get bnp and cxr today.  Follow up in 6  months controlled med visit or sooner if needed.    Mackie Pai, PA-C

## 2022-03-12 NOTE — Patient Instructions (Addendum)
Anxiety- rx xanax 0.5 mg to use bid prn anxiety. #15 tab. Contract and uds today.  Some depressed mood in past and decreased concentration as well increased to 300 mg xl.  Will see if wellbutrin helping with concentration at higher dose  Fatigue cbc, iron, vit d and b12.  Mild pedal edema with  random mild dyspnea. Also random cough. Get bnp and cxr today.  Follow up in 6 months controlled med visit or sooner if needed.

## 2022-03-16 ENCOUNTER — Encounter: Payer: Self-pay | Admitting: Medical

## 2022-03-16 ENCOUNTER — Other Ambulatory Visit (HOSPITAL_BASED_OUTPATIENT_CLINIC_OR_DEPARTMENT_OTHER): Payer: Self-pay

## 2022-03-16 LAB — DRUG MONITORING PANEL 376104, URINE

## 2022-03-16 LAB — DM TEMPLATE

## 2022-03-16 MED ORDER — VITAMIN D (ERGOCALCIFEROL) 1.25 MG (50000 UNIT) PO CAPS
50000.0000 [IU] | ORAL_CAPSULE | ORAL | 0 refills | Status: DC
  Filled 2022-03-16: qty 8, 56d supply, fill #0

## 2022-03-16 NOTE — Addendum Note (Signed)
Addended by: Anabel Halon on: 03/16/2022 05:42 PM   Modules accepted: Orders

## 2022-03-23 DIAGNOSIS — M7989 Other specified soft tissue disorders: Secondary | ICD-10-CM | POA: Diagnosis not present

## 2022-03-23 DIAGNOSIS — R768 Other specified abnormal immunological findings in serum: Secondary | ICD-10-CM | POA: Diagnosis not present

## 2022-03-23 DIAGNOSIS — R5383 Other fatigue: Secondary | ICD-10-CM | POA: Diagnosis not present

## 2022-03-23 DIAGNOSIS — D8989 Other specified disorders involving the immune mechanism, not elsewhere classified: Secondary | ICD-10-CM | POA: Diagnosis not present

## 2022-03-23 DIAGNOSIS — L52 Erythema nodosum: Secondary | ICD-10-CM | POA: Diagnosis not present

## 2022-03-23 DIAGNOSIS — E669 Obesity, unspecified: Secondary | ICD-10-CM | POA: Diagnosis not present

## 2022-03-23 DIAGNOSIS — Z6832 Body mass index (BMI) 32.0-32.9, adult: Secondary | ICD-10-CM | POA: Diagnosis not present

## 2022-03-29 ENCOUNTER — Encounter: Payer: Self-pay | Admitting: Medical

## 2022-03-29 DIAGNOSIS — E538 Deficiency of other specified B group vitamins: Secondary | ICD-10-CM

## 2022-03-29 NOTE — Telephone Encounter (Signed)
How many weeks is patient supposed to get b12 injections

## 2022-03-30 ENCOUNTER — Other Ambulatory Visit (HOSPITAL_BASED_OUTPATIENT_CLINIC_OR_DEPARTMENT_OTHER): Payer: Self-pay

## 2022-03-30 MED ORDER — "SYRINGE/NEEDLE (DISP) 25G X 5/8"" 3 ML MISC"
1.0000 | 0 refills | Status: DC
  Filled 2022-03-30: qty 5, 30d supply, fill #0

## 2022-03-30 MED ORDER — CYANOCOBALAMIN 1000 MCG/ML IJ SOLN
INTRAMUSCULAR | 0 refills | Status: DC
  Filled 2022-03-30: qty 4, 28d supply, fill #0

## 2022-04-16 ENCOUNTER — Encounter: Payer: Self-pay | Admitting: Family Medicine

## 2022-04-16 ENCOUNTER — Ambulatory Visit: Payer: 59 | Admitting: Family Medicine

## 2022-04-16 ENCOUNTER — Other Ambulatory Visit (HOSPITAL_BASED_OUTPATIENT_CLINIC_OR_DEPARTMENT_OTHER): Payer: Self-pay

## 2022-04-16 VITALS — BP 121/80 | HR 95 | Temp 98.2°F | Ht 65.0 in | Wt 196.1 lb

## 2022-04-16 DIAGNOSIS — J040 Acute laryngitis: Secondary | ICD-10-CM | POA: Diagnosis not present

## 2022-04-16 DIAGNOSIS — M25511 Pain in right shoulder: Secondary | ICD-10-CM

## 2022-04-16 DIAGNOSIS — H6591 Unspecified nonsuppurative otitis media, right ear: Secondary | ICD-10-CM | POA: Diagnosis not present

## 2022-04-16 LAB — POCT INFLUENZA A/B

## 2022-04-16 LAB — POC COVID19 BINAXNOW

## 2022-04-16 MED ORDER — AMOXICILLIN-POT CLAVULANATE 875-125 MG PO TABS
1.0000 | ORAL_TABLET | Freq: Two times a day (BID) | ORAL | 0 refills | Status: AC
  Filled 2022-04-16: qty 14, 7d supply, fill #0

## 2022-04-16 MED ORDER — PREDNISONE 20 MG PO TABS
40.0000 mg | ORAL_TABLET | Freq: Every day | ORAL | 0 refills | Status: AC
  Filled 2022-04-16: qty 10, 5d supply, fill #0

## 2022-04-16 NOTE — Progress Notes (Signed)
Chief Complaint  Patient presents with   [place] here for URI complaints.  Duration: 9 days  Associated symptoms: sinus congestion, rhinorrhea, wheezing, shortness of breath, fatigue, body aches, and coughing Denies: sinus pain, itchy watery eyes, ear pain, ear drainage, sore throat, and fevers Treatment to date: Nyquil, OTC Cold and flu medication Sick contacts: Yes; sister + for covid Tested neg for covid.   [place] jt pain Patient was seen by me at the end of August for pain of her right clavicle region.  Imaging was unremarkable.  She was given stretches and exercises which have improved things.  She is still having some tenderness over the inside portion of the collarbone.  It is still protruding more than the left side.  No swelling, redness, or bruising.  Past Medical History:  Diagnosis Date   Anemia    Asthma    childhood exercise induced   Enlarged thyroid    during pregnancy   Gestational diabetes    diet controlled   Headache    Migraines   History of blood transfusion    History of hiatal hernia    No pertinent past medical history    Pinched nerve in neck     Objective BP 121/80 (BP Location: Left Arm, Patient Position: Sitting, Cuff Size: Normal)   Pulse 95   Temp 98.2 F (36.8 C) (Oral)   Ht '5\' 5"'$  (1.651 m)   Wt 196 lb 2 oz (89 kg)   SpO2 99%   BMI 32.64 kg/m  General: Awake, alert, appears stated age HEENT: AT, [place], ears patent b/l and TM's neg, nares patent w/o discharge, pharynx pink and without exudates, MMM Neck: No masses or asymmetry Heart: RRR Lungs: CTAB, no accessory muscle use MSK: R medial Clavicle mildly ttp and more prominent than contralateral side.  Psych: Age appropriate judgment and insight, normal mood and affect  Laryngitis  Fluid level behind tympanic membrane of right ear - Plan: predniSONE (DELTASONE) 20 MG tablet  Sternoclavicular joint pain, right  Flu and covid testing neg. 5 d pred burst 40  mg/d. If no improvement in 2-3 d, will send in Augmentin to take for 7 days. Continue to push fluids, practice good hand hygiene, cover mouth when coughing. F/u prn. If starting to experience fevers, shaking, or shortness of breath, seek immediate care. [place] joint pain is improving.  Physical therapy if she starts having worsening again. Pt voiced understanding and agreement to the plan.  [place], DO 04/16/22 3:14 PM

## 2022-04-16 NOTE — Patient Instructions (Signed)
Continue to push fluids, practice good hand hygiene, and cover your mouth if you cough. ? ?If you start having fevers, shaking or shortness of breath, seek immediate care. ? ?OK to take Tylenol 1000 mg (2 extra strength tabs) or 975 mg (3 regular strength tabs) every 6 hours as needed. ? ?Let us know if you need anything. ?

## 2022-04-19 ENCOUNTER — Telehealth: Payer: 59 | Admitting: Family Medicine

## 2022-04-19 ENCOUNTER — Other Ambulatory Visit (HOSPITAL_BASED_OUTPATIENT_CLINIC_OR_DEPARTMENT_OTHER): Payer: Self-pay

## 2022-04-19 DIAGNOSIS — B3731 Acute candidiasis of vulva and vagina: Secondary | ICD-10-CM | POA: Diagnosis not present

## 2022-04-19 MED ORDER — FLUCONAZOLE 150 MG PO TABS
150.0000 mg | ORAL_TABLET | ORAL | 0 refills | Status: DC
  Filled 2022-04-19: qty 2, 3d supply, fill #0

## 2022-04-19 NOTE — Progress Notes (Signed)

## 2022-05-03 ENCOUNTER — Other Ambulatory Visit (HOSPITAL_BASED_OUTPATIENT_CLINIC_OR_DEPARTMENT_OTHER): Payer: Self-pay

## 2022-05-03 MED ORDER — FLUARIX QUADRIVALENT 0.5 ML IM SUSY
PREFILLED_SYRINGE | INTRAMUSCULAR | 0 refills | Status: DC
  Filled 2022-05-03: qty 0.5, 1d supply, fill #0

## 2022-05-13 ENCOUNTER — Other Ambulatory Visit: Payer: Self-pay | Admitting: Medical

## 2022-05-13 ENCOUNTER — Other Ambulatory Visit (HOSPITAL_BASED_OUTPATIENT_CLINIC_OR_DEPARTMENT_OTHER): Payer: Self-pay

## 2022-05-13 MED ORDER — ALPRAZOLAM 1 MG PO TABS
0.5000 mg | ORAL_TABLET | Freq: Every day | ORAL | 1 refills | Status: DC | PRN
  Filled 2022-05-13: qty 15, 15d supply, fill #0
  Filled 2022-08-08: qty 15, 15d supply, fill #1

## 2022-05-13 NOTE — Telephone Encounter (Addendum)
Requesting: xanax Contract:03/12/22 UDS:03/12/22 Last Visit:04/16/22 Next Visit:n/a Last Refill:02/08/22  Please Advise   Rx refill sent to pt pharmacy.

## 2022-05-13 NOTE — Telephone Encounter (Signed)
Medication:   Rx #: [id number]  ALPRAZolam Duanne Moron) 1 MG tablet [[id number]]   Has the patient contacted their pharmacy? No. (If no, request that the patient contact the pharmacy for the refill.) (If yes, when and what did the pharmacy advise?)  Preferred Pharmacy (with phone number or street name):   [facility] [address], [place], [place] [address] Phone: [phone]  Fax: [phone]   Agent: Please be advised that RX refills may take up to 3 business days. We ask that you follow-up with your pharmacy.

## 2022-05-14 DIAGNOSIS — R3915 Urgency of urination: Secondary | ICD-10-CM | POA: Diagnosis not present

## 2022-05-14 DIAGNOSIS — R3914 Feeling of incomplete bladder emptying: Secondary | ICD-10-CM | POA: Diagnosis not present

## 2022-05-14 DIAGNOSIS — R350 Frequency of micturition: Secondary | ICD-10-CM | POA: Diagnosis not present

## 2022-06-04 ENCOUNTER — Ambulatory Visit (AMBULATORY_SURGERY_CENTER): Payer: 59

## 2022-06-04 ENCOUNTER — Other Ambulatory Visit (HOSPITAL_BASED_OUTPATIENT_CLINIC_OR_DEPARTMENT_OTHER): Payer: Self-pay

## 2022-06-04 VITALS — Ht 66.0 in | Wt 196.0 lb

## 2022-06-04 DIAGNOSIS — Z1211 Encounter for screening for malignant neoplasm of colon: Secondary | ICD-10-CM

## 2022-06-04 MED ORDER — NA SULFATE-K SULFATE-MG SULF 17.5-3.13-1.6 GM/177ML PO SOLN
1.0000 | Freq: Once | ORAL | 0 refills | Status: AC
  Filled 2022-06-04 – 2022-06-23 (×2): qty 354, 1d supply, fill #0

## 2022-06-04 NOTE — Progress Notes (Signed)

## 2022-06-11 ENCOUNTER — Other Ambulatory Visit (HOSPITAL_BASED_OUTPATIENT_CLINIC_OR_DEPARTMENT_OTHER): Payer: Self-pay

## 2022-06-22 ENCOUNTER — Telehealth: Payer: 59 | Admitting: Physician Assistant

## 2022-06-22 DIAGNOSIS — B3731 Acute candidiasis of vulva and vagina: Secondary | ICD-10-CM | POA: Diagnosis not present

## 2022-06-23 ENCOUNTER — Other Ambulatory Visit (HOSPITAL_BASED_OUTPATIENT_CLINIC_OR_DEPARTMENT_OTHER): Payer: Self-pay | Admitting: Family Medicine

## 2022-06-23 ENCOUNTER — Other Ambulatory Visit (HOSPITAL_BASED_OUTPATIENT_CLINIC_OR_DEPARTMENT_OTHER): Payer: Self-pay

## 2022-06-23 ENCOUNTER — Encounter: Payer: Self-pay | Admitting: Medical

## 2022-06-23 DIAGNOSIS — E538 Deficiency of other specified B group vitamins: Secondary | ICD-10-CM

## 2022-06-23 DIAGNOSIS — B3731 Acute candidiasis of vulva and vagina: Secondary | ICD-10-CM

## 2022-06-23 MED ORDER — FLUCONAZOLE 150 MG PO TABS
150.0000 mg | ORAL_TABLET | Freq: Once | ORAL | 0 refills | Status: AC
  Filled 2022-06-23: qty 1, 1d supply, fill #0

## 2022-06-23 NOTE — Telephone Encounter (Signed)
Is pt supposed to continue injections , I see where you stated she can take b12 otc

## 2022-06-23 NOTE — Progress Notes (Signed)
I have spent 5 minutes in review of e-visit questionnaire, review and updating patient chart, medical decision making and response to patient.   Shaia Porath Cody Abdoul Encinas, PA-C    

## 2022-06-23 NOTE — Progress Notes (Signed)

## 2022-06-24 ENCOUNTER — Other Ambulatory Visit (HOSPITAL_BASED_OUTPATIENT_CLINIC_OR_DEPARTMENT_OTHER): Payer: Self-pay

## 2022-06-25 ENCOUNTER — Other Ambulatory Visit (HOSPITAL_BASED_OUTPATIENT_CLINIC_OR_DEPARTMENT_OTHER): Payer: Self-pay

## 2022-06-25 MED ORDER — CYANOCOBALAMIN 1000 MCG/ML IJ SOLN
INTRAMUSCULAR | 0 refills | Status: AC
  Filled 2022-06-25: qty 3, 84d supply, fill #0
  Filled 2022-07-08: qty 5, 150d supply, fill #0
  Filled 2022-07-09: qty 4, 28d supply, fill #0

## 2022-06-25 MED ORDER — "SYRINGE/NEEDLE (DISP) 25G X 5/8"" 3 ML MISC"
1.0000 | 0 refills | Status: DC
  Filled 2022-06-25 – 2022-07-09 (×3): qty 5, 30d supply, fill #0

## 2022-06-30 ENCOUNTER — Other Ambulatory Visit (HOSPITAL_BASED_OUTPATIENT_CLINIC_OR_DEPARTMENT_OTHER): Payer: Self-pay

## 2022-06-30 ENCOUNTER — Encounter: Payer: Self-pay | Admitting: Internal Medicine

## 2022-06-30 ENCOUNTER — Telehealth: Payer: Self-pay | Admitting: Internal Medicine

## 2022-06-30 NOTE — Telephone Encounter (Signed)
Patient is calling seeking advice on what medication she can take for her migraines. Please advise

## 2022-06-30 NOTE — Telephone Encounter (Signed)
Explained to pt. Why we stop NSAIDS prior to doing procedure,. Recommend the stronger dose of tylenol arthritis,she stated she typically take bc powder or goody's and that knock it right out. All questions answered prior to ending call. Verbalized understanding.

## 2022-07-02 ENCOUNTER — Ambulatory Visit (AMBULATORY_SURGERY_CENTER): Payer: 59 | Admitting: Internal Medicine

## 2022-07-02 ENCOUNTER — Encounter: Payer: Self-pay | Admitting: Internal Medicine

## 2022-07-02 VITALS — BP 101/55 | HR 79 | Temp 97.5°F | Resp 13 | Ht 65.0 in | Wt 196.0 lb

## 2022-07-02 DIAGNOSIS — Z1211 Encounter for screening for malignant neoplasm of colon: Secondary | ICD-10-CM | POA: Diagnosis not present

## 2022-07-02 MED ORDER — SODIUM CHLORIDE 0.9 % IV SOLN: 500.0000 mL | Freq: Once | INTRAVENOUS | Status: DC

## 2022-07-02 NOTE — Progress Notes (Signed)
Sedate, gd SR, tolerated procedure well, VSS, report to RN 

## 2022-07-02 NOTE — Progress Notes (Signed)
HISTORY OF PRESENT ILLNESS:  Nicole Donaldson is a 46 y.o. female who is directly referred for screening colonoscopy.  Previous examination 2009 was normal  REVIEW OF SYSTEMS:  All non-GI ROS negative except for  Past Medical History:  Diagnosis Date   Anemia    Asthma    childhood exercise induced   Enlarged thyroid    during pregnancy   Headache    Migraines   History of blood transfusion    History of hiatal hernia    No pertinent past medical history    Pinched nerve in neck     Past Surgical History:  Procedure Laterality Date   BREAST BIOPSY Right 12/11/2021   COLONOSCOPY     DILATATION & CURETTAGE/HYSTEROSCOPY WITH MYOSURE N/A 03/19/2016   Procedure: [place];  Surgeon: Servando Salina, MD;  Location: [facility];  Service: Gynecology;  Laterality: N/A;   NO PAST SURGERIES     UPPER GI ENDOSCOPY      Social History Deshanda SURA CANUL  reports that she has never smoked. She has never used smokeless tobacco. She reports current alcohol use. She reports that she does not use drugs.  family history includes Breast cancer (age of onset: 78) in her mother; COPD in her maternal grandfather; Cancer in her maternal grandmother and mother; Diabetes in her maternal grandmother; Early death in her father; Heart disease in her maternal grandfather; Hypertension in her maternal grandmother and mother; Thyroid disease in her mother.  Allergies  Allergen Reactions   Clarithromycin Swelling    Lip swelling   Dapsone Swelling   Diphenhydramine Hcl Swelling   Sudafed [Pseudoephedrine Hcl] Swelling    Lip swelling       PHYSICAL EXAMINATION: Vital signs: BP (!) 116/49   Pulse 77   Temp (!) 97.5 F (36.4 C) (Temporal)   Ht '5\' 5"'$  (1.651 m)   Wt 196 lb (88.9 kg)   LMP 06/26/2022   SpO2 100%   BMI 32.62 kg/m  General: Well-developed, well-nourished, no acute distress HEENT: Sclerae are anicteric, conjunctiva pink. Oral mucosa intact Lungs:  Clear Heart: Regular Abdomen: soft, nontender, nondistended, no obvious ascites, no peritoneal signs, normal bowel sounds. No organomegaly. Extremities: No edema Psychiatric: alert and oriented x3. Cooperative     ASSESSMENT:  Colon cancer screening   PLAN:  Screening colonoscopy

## 2022-07-02 NOTE — Op Note (Signed)
[place] Patient Name: Nicole Donaldson Procedure Date: 07/02/2022 9:53 AM MRN: [id number] Endoscopist: Docia Chuck. Henrene Pastor , MD, [id number] Age: 46 Referring MD:  Date of Birth: May 26, 1976 Gender: Female Account #: [id number] Procedure:                Colonoscopy Indications:              Screening for colorectal malignant neoplasm.                            Patient had normal colonoscopy in 2009 Medicines:                Monitored Anesthesia Care Procedure:                Pre-Anesthesia Assessment:                           - Prior to the procedure, a History and Physical                            was performed, and patient medications and                            allergies were reviewed. The patient's tolerance of                            previous anesthesia was also reviewed. The risks                            and benefits of the procedure and the sedation                            options and risks were discussed with the patient.                            All questions were answered, and informed consent                            was obtained. Prior Anticoagulants: The patient has                            taken no anticoagulant or antiplatelet agents. ASA                            Grade Assessment: II - A patient with mild systemic                            disease. After reviewing the risks and benefits,                            the patient was deemed in satisfactory condition to                            undergo the procedure.  After obtaining informed consent, the colonoscope                            was passed under direct vision. Throughout the                            procedure, the patient's blood pressure, pulse, and                            oxygen saturations were monitored continuously. The                            Olympus CF-HQ190L ([id number]) Colonoscope was                            introduced through the anus  and advanced to the the                            cecum, identified by appendiceal orifice and                            ileocecal valve. The ileocecal valve, appendiceal                            orifice, and rectum were photographed. The quality                            of the bowel preparation was excellent. The                            colonoscopy was performed without difficulty. The                            patient tolerated the procedure well. The bowel                            preparation used was SUPREP via split dose                            instruction. Scope In: 10:04:36 AM Scope Out: 10:15:32 AM Scope Withdrawal Time: 0 hours 8 minutes 46 seconds  Total Procedure Duration: 0 hours 10 minutes 56 seconds  Findings:                 The entire examined colon appeared normal on direct                            and retroflexion views. Complications:            No immediate complications. Estimated blood loss:                            None. Estimated Blood Loss:     Estimated blood loss: none. Impression:               - The entire examined colon is  normal on direct and                            retroflexion views.                           - No specimens collected. Recommendation:           - Repeat colonoscopy in 10 years for screening                            purposes.                           - Patient has a contact number available for                            emergencies. The signs and symptoms of potential                            delayed complications were discussed with the                            patient. Return to normal activities tomorrow.                            Written discharge instructions were provided to the                            patient.                           - Resume previous diet.                           - Continue present medications. Docia Chuck. Henrene Pastor, MD 07/02/2022 10:20:17 AM This report has been signed  electronically.

## 2022-07-02 NOTE — Patient Instructions (Signed)
Resume previous diet and medications. Repeat Colonoscopy in 10 years for surveillance.  YOU HAD AN ENDOSCOPIC PROCEDURE TODAY AT THE [facility]:   Refer to the procedure report that was given to you for any specific questions about what was found during the examination.  If the procedure report does not answer your questions, please call your gastroenterologist to clarify.  If you requested that your care partner not be given the details of your procedure findings, then the procedure report has been included in a sealed envelope for you to review at your convenience later.  YOU SHOULD EXPECT: Some feelings of bloating in the abdomen. Passage of more gas than usual.  Walking can help get rid of the air that was put into your GI tract during the procedure and reduce the bloating. If you had a lower endoscopy (such as a colonoscopy or flexible sigmoidoscopy) you may notice spotting of blood in your stool or on the toilet paper. If you underwent a bowel prep for your procedure, you may not have a normal bowel movement for a few days.  Please Note:  You might notice some irritation and congestion in your nose or some drainage.  This is from the oxygen used during your procedure.  There is no need for concern and it should clear up in a day or so.  SYMPTOMS TO REPORT IMMEDIATELY:  Following lower endoscopy (colonoscopy or flexible sigmoidoscopy):  Excessive amounts of blood in the stool  Significant tenderness or worsening of abdominal pains  Swelling of the abdomen that is new, acute  Fever of 100F or higher   For urgent or emergent issues, a gastroenterologist can be reached at any hour by calling [phone]. Do not use MyChart messaging for urgent concerns.    DIET:  We do recommend a small meal at first, but then you may proceed to your regular diet.  Drink plenty of fluids but you should avoid alcoholic beverages for 24 hours.  ACTIVITY:  You should plan to take it easy for  the rest of today and you should NOT DRIVE or use heavy machinery until tomorrow (because of the sedation medicines used during the test).    FOLLOW UP: Our staff will call the number listed on your records the next business day following your procedure.  We will call around 7:15- 8:00 am to check on you and address any questions or concerns that you may have regarding the information given to you following your procedure. If we do not reach you, we will leave a message.     If any biopsies were taken you will be contacted by phone or by letter within the next 1-3 weeks.  Please call us at [phone] if you have not heard about the biopsies in 3 weeks.    SIGNATURES/CONFIDENTIALITY: You and/or your care partner have signed paperwork which will be entered into your electronic medical record.  These signatures attest to the fact that that the information above on your After Visit Summary has been reviewed and is understood.  Full responsibility of the confidentiality of this discharge information lies with you and/or your care-partner. 

## 2022-07-07 ENCOUNTER — Telehealth: Payer: Self-pay | Admitting: *Deleted

## 2022-07-07 NOTE — Telephone Encounter (Signed)
Attempted to call patient for their post-procedure follow-up call. No answer. Left voicemail.   

## 2022-07-08 ENCOUNTER — Other Ambulatory Visit (HOSPITAL_BASED_OUTPATIENT_CLINIC_OR_DEPARTMENT_OTHER): Payer: Self-pay

## 2022-07-09 ENCOUNTER — Other Ambulatory Visit (HOSPITAL_BASED_OUTPATIENT_CLINIC_OR_DEPARTMENT_OTHER): Payer: Self-pay

## 2022-07-14 ENCOUNTER — Other Ambulatory Visit (HOSPITAL_BASED_OUTPATIENT_CLINIC_OR_DEPARTMENT_OTHER): Payer: Self-pay

## 2022-08-09 ENCOUNTER — Other Ambulatory Visit: Payer: Self-pay

## 2022-09-09 ENCOUNTER — Other Ambulatory Visit (HOSPITAL_BASED_OUTPATIENT_CLINIC_OR_DEPARTMENT_OTHER): Payer: Self-pay

## 2022-10-21 ENCOUNTER — Other Ambulatory Visit: Payer: Self-pay | Admitting: Medical

## 2022-10-21 ENCOUNTER — Encounter: Payer: Self-pay | Admitting: Family Medicine

## 2022-10-21 ENCOUNTER — Ambulatory Visit: Payer: 59 | Admitting: Family Medicine

## 2022-10-21 ENCOUNTER — Telehealth: Payer: Self-pay | Admitting: Medical

## 2022-10-21 ENCOUNTER — Other Ambulatory Visit (HOSPITAL_BASED_OUTPATIENT_CLINIC_OR_DEPARTMENT_OTHER): Payer: Self-pay

## 2022-10-21 VITALS — BP 105/75 | HR 87 | Ht 65.0 in | Wt 196.0 lb

## 2022-10-21 DIAGNOSIS — R600 Localized edema: Secondary | ICD-10-CM | POA: Diagnosis not present

## 2022-10-21 DIAGNOSIS — R0789 Other chest pain: Secondary | ICD-10-CM | POA: Diagnosis not present

## 2022-10-21 DIAGNOSIS — M25551 Pain in right hip: Secondary | ICD-10-CM | POA: Diagnosis not present

## 2022-10-21 DIAGNOSIS — R109 Unspecified abdominal pain: Secondary | ICD-10-CM | POA: Diagnosis not present

## 2022-10-21 LAB — COMPREHENSIVE METABOLIC PANEL
ALT: 9 U/L (ref 0–35)
AST: 14 U/L (ref 0–37)
Albumin: 3.8 g/dL (ref 3.5–5.2)
Alkaline Phosphatase: 69 U/L (ref 39–117)
BUN: 11 mg/dL (ref 6–23)
CO2: 29 mEq/L (ref 19–32)
Calcium: 8.7 mg/dL (ref 8.4–10.5)
Chloride: 105 mEq/L (ref 96–112)
Creatinine, Ser: 0.76 mg/dL (ref 0.40–1.20)
GFR: 94.08 mL/min (ref >60.00)
Glucose, Bld: 95 mg/dL (ref 70–99)
Potassium: 3.5 mEq/L (ref 3.5–5.1)
Sodium: 141 mEq/L (ref 135–145)
Total Bilirubin: 0.4 mg/dL (ref 0.2–1.2)
Total Protein: 6.8 g/dL (ref 6.0–8.3)

## 2022-10-21 LAB — POC URINALSYSI DIPSTICK (AUTOMATED)
Bilirubin, UA: NEGATIVE
Glucose, UA: NEGATIVE
Ketones, UA: NEGATIVE
Leukocytes, UA: NEGATIVE
Nitrite, UA: NEGATIVE
Protein, UA: NEGATIVE
Spec Grav, UA: 1.025 (ref 1.010–1.025)
Urobilinogen, UA: 0.2 E.U./dL
pH, UA: 5 (ref 5.0–8.0)

## 2022-10-21 LAB — CBC WITH DIFFERENTIAL/PLATELET
Basophils Absolute: 0 10*3/uL (ref 0.0–0.1)
Basophils Relative: 0.6 % (ref 0.0–3.0)
Eosinophils Absolute: 0.1 10*3/uL (ref 0.0–0.7)
Eosinophils Relative: 1.5 % (ref 0.0–5.0)
HCT: 33.3 % — ABNORMAL LOW (ref 36.0–46.0)
Hemoglobin: 10.5 g/dL — ABNORMAL LOW (ref 12.0–15.0)
Lymphocytes Relative: 29.4 % (ref 12.0–46.0)
Lymphs Abs: 1.1 10*3/uL (ref 0.7–4.0)
MCHC: 31.6 g/dL (ref 30.0–36.0)
MCV: 78.7 fl (ref 78.0–100.0)
Monocytes Absolute: 0.3 10*3/uL (ref 0.1–1.0)
Monocytes Relative: 9.3 % (ref 3.0–12.0)
Neutro Abs: 2.2 10*3/uL (ref 1.4–7.7)
Neutrophils Relative %: 59.2 % (ref 43.0–77.0)
Platelets: 369 10*3/uL (ref 150.0–400.0)
RBC: 4.23 Mil/uL (ref 3.87–5.11)
RDW: 17.1 % — ABNORMAL HIGH (ref 11.5–15.5)
WBC: 3.8 10*3/uL — ABNORMAL LOW (ref 4.0–10.5)

## 2022-10-21 LAB — BRAIN NATRIURETIC PEPTIDE: Pro B Natriuretic peptide (BNP): 31 pg/mL (ref 0.0–100.0)

## 2022-10-21 MED ORDER — ALPRAZOLAM 1 MG PO TABS: 0.5000 mg | ORAL_TABLET | Freq: Every day | ORAL | 1 refills | Status: DC | PRN

## 2022-10-21 MED ORDER — ALPRAZOLAM 1 MG PO TABS
0.5000 mg | ORAL_TABLET | Freq: Every day | ORAL | 0 refills | Status: DC | PRN
  Filled 2022-10-21: qty 15, 15d supply, fill #0

## 2022-10-21 MED ORDER — IRON (FERROUS SULFATE) 325 (65 FE) MG PO TABS
325.0000 mg | ORAL_TABLET | Freq: Every day | ORAL | 3 refills | Status: AC
  Filled 2022-10-21: qty 100, 90d supply, fill #0
  Filled 2023-01-18: qty 100, 100d supply, fill #0

## 2022-10-21 MED ORDER — MELOXICAM 15 MG PO TABS
15.0000 mg | ORAL_TABLET | Freq: Every day | ORAL | 0 refills | Status: DC
Start: 2022-10-21 — End: 2024-04-23
  Filled 2022-10-21: qty 30, 30d supply, fill #0

## 2022-10-21 NOTE — Telephone Encounter (Signed)
Patient called wanting to change her sports medicine referral to [facility] to Dr. Karie Schwalbe. Patient saw Hyman Hopes today. Please advice.

## 2022-10-21 NOTE — Patient Instructions (Addendum)
Hip pain/low back pain: - given duration of pain, ordering a hip xray - urine was normal other than blood, but you are on your period currently - hip with lateral pain to palpation more consistent with bursitis - starting Meloxicam (do not take with other antiinflammatories like Advil or Aleve). Try rest, ice, home exercises/stretches. Referral to sports medicine for possible injection if indicated. Consider PT.   Chest pain and leg swelling: - EKG = normal - labs today; will update you with results - Recommend low sodium diet, adequate hydration, elevate legs when seated as much as possible, wear compression socks. - cardiology referral to Dr. Servando Salina at your request      - Please contact office for follow-up if symptoms do not improve or worsen. Seek emergency care if symptoms become severe.

## 2022-10-21 NOTE — Telephone Encounter (Signed)
15 tab rx sent. She is due for controlled med visit. Have her schedule with me within next month.

## 2022-10-21 NOTE — Progress Notes (Signed)
Acute Office Visit  Subjective:     Patient ID: Nicole Donaldson, female    DOB: 1975-07-12, 47 y.o.   MRN: [id number]  Chief Complaint  Patient presents with   Hip Pain    Right - started in December, worsening two days ago, bilateral lower leg swelling x2 weeks Right flank pain x 2 days     Patient is in today for right hip pain and chest discomfort with BLE edema.   Right hip pain: - Patient reports she first started with right lateral hip pain in December, 4 months ago. Pain was primarily noticeable with lying on the right side. Pain has progressively worsened and for the past several weeks she has had worsening pain, decreased mobility, radiation of pain throughout hip, mild edema. She reports the pain is a tight, burning, pulling sensation, and still tender to palpation over greater trochanter. She has gotten minimal improvement with ibuprofen, tylenol, and lidocaine patch. Currently at rest, pain is 4/10, but with palpation or trying to cross her legs to put on shoes pain gets up to 7/10. States skin does not feel warm to the touch or have any discoloration. She has also started noticing some right low back pain - she is not sure if this is related to the hip pain or a urinary symptom. No other urinary symptoms - denies dysuria, hematuria, urgency, frequency, fevers. She is currently on her period   Chest discomfort and lower leg swelling:  - Patient states she has had years of intermittent chest discomfort with negative cardiac workup previously - thought to be anxiety related. However, she is now concerned because episodes of chest heaviness have become more frequent and she has now had a few weeks of bilateral lower extremity edema. The chest pain is described as intermittent pressure/heaviness that comes randomly, sometimes there is an anxiety component; no patterns with activity level. States she has noticed some dyspnea on exertion lately, but admits she may be out of shape as she  does not get regular exercise. The leg swelling was first noticed a few weeks ago and has been fairly consistent. States she will get some mild improvement overnight, but the swelling never fully goes away. She has not noticed any skin changes or pain in her feet/ankles. She is on her feet most of the day for work and does not usually wear compression socks.      ROS All review of systems negative except what is listed in the HPI      Objective:    BP 105/75   Pulse 87   Ht  (1.651 m)   Wt 196 lb (88.9 kg)   SpO2 100%   BMI 32.62 kg/m    Physical Exam Vitals reviewed.  Constitutional:      Appearance: Normal appearance.  Cardiovascular:     Rate and Rhythm: Normal rate and regular rhythm.     Pulses: Normal pulses.     Heart sounds: Normal heart sounds.  Pulmonary:     Effort: Pulmonary effort is normal.     Breath sounds: Normal breath sounds.  Musculoskeletal:     Comments: Right lateral hip with mild edema, pain with palpation; pain with external rotation BLE 2+ edema, no discoloration/warmth  Skin:    General: Skin is warm and dry.  Neurological:     Mental Status: She is alert and oriented to person, place, and time.  Psychiatric:        Mood and Affect: Mood  normal.        Behavior: Behavior normal.        Thought Content: Thought content normal.        Judgment: Judgment normal.     Results for orders placed or performed in visit on 10/21/22  POCT Urinalysis Dipstick (Automated)  Result Value Ref Range   Color, UA yellow    Clarity, UA clear    Glucose, UA Negative Negative   Bilirubin, UA negative    Ketones, UA negative    Spec Grav, UA 1.025 1.010 - 1.025   Blood, UA moderate    pH, UA 5.0 5.0 - 8.0   Protein, UA Negative Negative   Urobilinogen, UA 0.2 0.2 or 1.0 E.U./dL   Nitrite, UA negative    Leukocytes, UA Negative Negative        Assessment & Plan:   Problem List Items Addressed This Visit             Right flank pain     -  Primary UA negative (other than blood; menstruating), consider musculoskeletal given other symptoms. Stay hydrated.  Patient aware of signs/symptoms requiring further/urgent evaluation.    Relevant Orders   POCT Urinalysis Dipstick (Automated) (Completed)   Bilateral leg edema     Chest discomfort - EKG = normal sinus rhythm, no ST changes - labs today; will update you with results - Recommend low sodium diet, adequate hydration, elevate legs when seated as much as possible, wear compression socks. - cardiology referral to Dr. Servando Salina at your request   Relevant Orders   EKG 12-Lead (Completed)   CBC with Differential/Platelet   Comprehensive metabolic panel   B Nat Peptide   Ambulatory referral to Cardiology   Right hip pain     - given duration of pain, ordering a hip xray - urine was normal other than blood, but you are on your period currently - hip with lateral pain to palpation more consistent with bursitis - starting Meloxicam (do not take with other antiinflammatories like Advil or Aleve). Try rest, ice, home exercises/stretches. Referral to sports medicine for possible injection if indicated. Consider PT.     Relevant Medications   meloxicam (MOBIC) 15 MG tablet   Other Relevant Orders   Ambulatory referral to Sports Medicine   DG HIP UNILAT W OR W/O PELVIS 2-3 VIEWS RIGHT       Meds ordered this encounter  Medications   meloxicam (MOBIC) 15 MG tablet    Sig: Take 1 tablet (15 mg total) by mouth daily.    Dispense:  30 tablet    Refill:  0    Order Specific Question:   Supervising Provider    Answer:   Bradd Canary [[id number]]    Return for pending labs.  Clayborne Dana, NP

## 2022-10-21 NOTE — Telephone Encounter (Signed)
Referral changed. Thanks  

## 2022-10-21 NOTE — Addendum Note (Signed)
Addended by: Maximino Sarin on: 10/21/2022 01:27 PM   Modules accepted: Orders

## 2022-10-21 NOTE — Addendum Note (Signed)
Addended by: Gwenevere Abbot on: 10/21/2022 06:00 PM   Modules accepted: Orders

## 2022-10-21 NOTE — Telephone Encounter (Signed)
Printed by mistake   Requesting: xanax Contract:03/12/22 UDS:03/12/22 Last Visit:10/21/22 Next Visit:n/a Last Refill:  Please Advise

## 2022-10-22 ENCOUNTER — Other Ambulatory Visit (HOSPITAL_BASED_OUTPATIENT_CLINIC_OR_DEPARTMENT_OTHER): Payer: Self-pay

## 2022-10-22 ENCOUNTER — Ambulatory Visit: Payer: 59

## 2022-10-22 ENCOUNTER — Other Ambulatory Visit: Payer: Self-pay | Admitting: Neurology

## 2022-10-22 DIAGNOSIS — D649 Anemia, unspecified: Secondary | ICD-10-CM

## 2022-10-22 NOTE — Addendum Note (Signed)
Addended by: Sumner Boast on: 10/22/2022 05:12 PM   Modules accepted: Orders

## 2022-10-23 LAB — IRON,TIBC AND FERRITIN PANEL
%SAT: 13 % (calc) — ABNORMAL LOW (ref 16–45)
Ferritin: 7 ng/mL — ABNORMAL LOW (ref 16–232)
Iron: 49 ug/dL (ref 40–190)
TIBC: 388 mcg/dL (calc) (ref 250–450)

## 2022-10-23 LAB — VITAMIN B12: Vitamin B-12: 669 pg/mL (ref 200–1100)

## 2022-10-27 ENCOUNTER — Other Ambulatory Visit (HOSPITAL_BASED_OUTPATIENT_CLINIC_OR_DEPARTMENT_OTHER): Payer: Self-pay

## 2022-11-17 ENCOUNTER — Other Ambulatory Visit (HOSPITAL_BASED_OUTPATIENT_CLINIC_OR_DEPARTMENT_OTHER): Payer: Self-pay

## 2023-01-17 ENCOUNTER — Other Ambulatory Visit (HOSPITAL_BASED_OUTPATIENT_CLINIC_OR_DEPARTMENT_OTHER): Payer: Self-pay | Admitting: Obstetrics and Gynecology

## 2023-01-17 ENCOUNTER — Encounter (HOSPITAL_BASED_OUTPATIENT_CLINIC_OR_DEPARTMENT_OTHER): Payer: Self-pay

## 2023-01-17 ENCOUNTER — Ambulatory Visit (HOSPITAL_BASED_OUTPATIENT_CLINIC_OR_DEPARTMENT_OTHER)
Admission: RE | Admit: 2023-01-17 | Discharge: 2023-01-17 | Disposition: A | Discharge status: Home / Self Care | Payer: 59 | Source: Ambulatory Visit | Attending: Obstetrics and Gynecology | Admitting: Obstetrics and Gynecology

## 2023-01-17 DIAGNOSIS — Z1231 Encounter for screening mammogram for malignant neoplasm of breast: Secondary | ICD-10-CM | POA: Diagnosis not present

## 2023-01-18 ENCOUNTER — Other Ambulatory Visit: Payer: Self-pay

## 2023-01-18 ENCOUNTER — Other Ambulatory Visit (HOSPITAL_BASED_OUTPATIENT_CLINIC_OR_DEPARTMENT_OTHER): Payer: Self-pay

## 2023-01-20 ENCOUNTER — Ambulatory Visit: Payer: 59 | Admitting: Cardiology

## 2023-01-30 ENCOUNTER — Telehealth: Payer: 59 | Admitting: Nurse Practitioner

## 2023-01-30 ENCOUNTER — Encounter: Payer: Self-pay | Admitting: Nurse Practitioner

## 2023-01-30 DIAGNOSIS — U071 COVID-19: Secondary | ICD-10-CM | POA: Diagnosis not present

## 2023-01-30 MED ORDER — PROMETHAZINE-DM 6.25-15 MG/5ML PO SYRP
5.0000 mL | ORAL_SOLUTION | Freq: Four times a day (QID) | ORAL | 0 refills | Status: DC | PRN
Start: 2023-01-30 — End: 2023-09-07

## 2023-01-30 MED ORDER — PREDNISONE 20 MG PO TABS
20.0000 mg | ORAL_TABLET | Freq: Every day | ORAL | 0 refills | Status: AC
Start: 2023-01-30 — End: 2023-02-04

## 2023-01-30 MED ORDER — FLUTICASONE PROPIONATE 50 MCG/ACT NA SUSP
2.0000 | Freq: Every day | NASAL | 0 refills | Status: AC
Start: 2023-01-30 — End: ?

## 2023-01-30 NOTE — Progress Notes (Signed)
Sounds good!!  E-Visit for Upper Respiratory Infection   We are sorry you are not feeling well.  Here is how we plan to help!  Based on what you have shared with me, it looks like you may have a viral upper respiratory infection.  Upper respiratory infections are caused by a large number of viruses; however, rhinovirus is the most common cause.   Symptoms vary from person to person, with common symptoms including sore throat, cough, fatigue or lack of energy and feeling of general discomfort.  A low-grade fever of up to 100.4 may present, but is often uncommon.  Symptoms vary however, and are closely related to a person's age or underlying illnesses.  The most common symptoms associated with an upper respiratory infection are nasal discharge or congestion, cough, sneezing, headache and pressure in the ears and face.  These symptoms usually persist for about 3 to 10 days, but can last up to 2 weeks.  It is important to know that upper respiratory infections do not cause serious illness or complications in most cases.    Upper respiratory infections can be transmitted from person to person, with the most common method of transmission being a person's hands.  The virus is able to live on the skin and can infect other persons for up to 2 hours after direct contact.  Also, these can be transmitted when someone coughs or sneezes; thus, it is important to cover the mouth to reduce this risk.  To keep the spread of the illness at bay, good hand hygiene is very important.  This is an infection that is most likely caused by a virus. There are no specific treatments other than to help you with the symptoms until the infection runs its course.  We are sorry you are not feeling well.  Here is how we plan to help!   For nasal congestion, you may use an oral decongestants such as Mucinex D or if you have glaucoma or high blood pressure use plain Mucinex.  Saline nasal spray or nasal drops can help and can safely be  used as often as needed for congestion.  For your congestion, I have prescribed Fluticasone nasal spray one spray in each nostril twice a day   If you do not have a history of heart disease, hypertension, diabetes or thyroid disease, prostate/bladder issues or glaucoma, you may also use Sudafed to treat nasal congestion.  It is highly recommended that you consult with a pharmacist or your primary care physician to ensure this medication is safe for you to take.     If you have a cough, you may use cough suppressants such as Delsym and Robitussin.  If you have glaucoma or high blood pressure, you can also use Coricidin HBP.   For cough I have prescribed for you A prescription cough medication called Promethazine and for your earache and facial pain I have prescribed prednisone for 5 days  If you have a sore or scratchy throat, use a saltwater gargle-  to  teaspoon of salt dissolved in a 4-ounce to 8-ounce glass of warm water.  Gargle the solution for approximately 15-30 seconds and then spit.  It is important not to swallow the solution.  You can also use throat lozenges/cough drops and Chloraseptic spray to help with throat pain or discomfort.  Warm or cold liquids can also be helpful in relieving throat pain.  For headache, pain or general discomfort, you can use Ibuprofen or Tylenol as directed.  Some authorities believe that zinc sprays or the use of Echinacea may shorten the course of your symptoms.   HOME CARE Only take medications as instructed by your medical team. Be sure to drink plenty of fluids. Water is fine as well as fruit juices, sodas and electrolyte beverages. You may want to stay away from caffeine or alcohol. If you are nauseated, try taking small sips of liquids. How do you know if you are getting enough fluid? Your urine should be a pale yellow or almost colorless. Get rest. Taking a steamy shower or using a humidifier may help nasal congestion and ease sore throat pain. You  can place a towel over your head and breathe in the steam from hot water coming from a faucet. Using a saline nasal spray works much the same way. Cough drops, hard candies and sore throat lozenges may ease your cough. Avoid close contacts especially the very young and the elderly Cover your mouth if you cough or sneeze Always remember to wash your hands.   GET HELP RIGHT AWAY IF: You develop worsening fever. If your symptoms do not improve within 10 days You develop yellow or green discharge from your nose over 3 days. You have coughing fits You develop a severe head ache or visual changes. You develop shortness of breath, difficulty breathing or start having chest pain Your symptoms persist after you have completed your treatment plan  MAKE SURE YOU  Understand these instructions. Will watch your condition. Will get help right away if you are not doing well or get worse.  Thank you for choosing an e-visit.  Your e-visit answers were reviewed by a board certified advanced clinical practitioner to complete your personal care plan. Depending upon the condition, your plan could have included both over the counter or prescription medications.  Please review your pharmacy choice. Make sure the pharmacy is open so you can pick up prescription now. If there is a problem, you may contact your provider through [employer] and have the prescription routed to another pharmacy.  Your safety is important to [place]. If you have drug allergies check your prescription carefully.   For the next 24 hours you can use MyChart to ask questions about today's visit, request a non-urgent call back, or ask for a work or school excuse. You will get an email in the next two days asking about your experience. I hope that your e-visit has been valuable and will speed your recovery.

## 2023-01-30 NOTE — Progress Notes (Signed)
I have spent 5 minutes in review of e-visit questionnaire, review and updating patient chart, medical decision making and response to patient.  ° °Zelda W Fleming, NP ° °  °

## 2023-01-31 ENCOUNTER — Ambulatory Visit: Payer: 59 | Admitting: Cardiology

## 2023-02-03 ENCOUNTER — Other Ambulatory Visit (HOSPITAL_BASED_OUTPATIENT_CLINIC_OR_DEPARTMENT_OTHER): Payer: Self-pay

## 2023-02-10 ENCOUNTER — Other Ambulatory Visit (HOSPITAL_BASED_OUTPATIENT_CLINIC_OR_DEPARTMENT_OTHER): Payer: Self-pay

## 2023-02-12 ENCOUNTER — Telehealth: Payer: 59 | Admitting: Family Medicine

## 2023-02-12 DIAGNOSIS — B3731 Acute candidiasis of vulva and vagina: Secondary | ICD-10-CM

## 2023-02-12 MED ORDER — FLUCONAZOLE 150 MG PO TABS: 150.0000 mg | ORAL_TABLET | ORAL | 0 refills | Status: DC

## 2023-02-12 NOTE — Progress Notes (Signed)

## 2023-04-06 ENCOUNTER — Ambulatory Visit: Payer: 59 | Attending: Cardiology | Admitting: Cardiology

## 2023-04-06 ENCOUNTER — Encounter: Payer: Self-pay | Admitting: Cardiology

## 2023-04-06 ENCOUNTER — Telehealth: Payer: Self-pay

## 2023-04-06 VITALS — BP 108/74 | HR 85 | Ht 65.0 in | Wt 191.6 lb

## 2023-04-06 DIAGNOSIS — Z7689 Persons encountering health services in other specified circumstances: Secondary | ICD-10-CM

## 2023-04-06 DIAGNOSIS — R0609 Other forms of dyspnea: Secondary | ICD-10-CM

## 2023-04-06 DIAGNOSIS — R7303 Prediabetes: Secondary | ICD-10-CM

## 2023-04-06 DIAGNOSIS — R0602 Shortness of breath: Secondary | ICD-10-CM

## 2023-04-06 DIAGNOSIS — Z13220 Encounter for screening for lipoid disorders: Secondary | ICD-10-CM | POA: Diagnosis not present

## 2023-04-06 NOTE — Telephone Encounter (Signed)
Called and left info to pt on [facility] machine.

## 2023-04-06 NOTE — Patient Instructions (Addendum)
Medication Instructions:  Your physician recommends that you continue on your current medications as directed. Please refer to the Current Medication list given to you today.  *If you need a refill on your cardiac medications before your next appointment, please call your pharmacy*   Lab Work: Lipid Panel and Hgb A1C    Testing/Procedures:  Your physician has requested that you have a stress echocardiogram. For further information please visit [email or web address] Please follow instruction sheet as given.      Stress Echocardiogram Information Sheet                                                      Instructions:    1. You may take your morning medications the morning of the test  2. Light breakfast no caffeine  3. Dress prepared to exercise.  4. DO NOT use ANY caffeine or tobacco products 3 hours before appointment.  5. Please bring all current prescription medications.  Your physician has recommended that you have a pulmonary function test. Pulmonary Function Tests are a group of tests that measure how well air moves in and out of your lungs.   Pulmonary Function Tests Pulmonary function tests (PFTs) are breathing tests that are used to: Measure how well your lungs work. Find out what is causing your lung problems. Find the best treatment for you. You may have PFTs: If you have a condition that affects your lungs, such as asthma or chronic obstructive pulmonary disease (COPD). To watch for changes in your lung function over time if you have a long-term (chronic) lung disease. If you are an IT trainer. PFTs check the effects of being exposed to chemicals over a long period of time. To check lung function: Before having surgery or other procedures. If you smoke. To check if prescribed medicines or treatments are helping your lungs. Tell a health care provider about: Any allergies you have. All medicines you are taking, including inhaler or nebulizer  medicines, vitamins, herbs, eye drops, creams, and over-the-counter medicines. Any bleeding problems you have. Any surgeries you have had, especially recent surgery of the eye, abdomen, or chest. These can make PFTs difficult or unsafe. Any medical conditions you have, including chest pain or heart problems, tuberculosis, or respiratory infections such as pneumonia, a cold, or the flu. Any fear of being in closed spaces (claustrophobia). Some of your tests may be in a closed space. What are the risks? Your health care provider will talk with you about risks. These may include: Feeling light-headed due to fast, deep breathing known as overbreathing or hyperventilation. An asthma attack from deep breathing. What happens before the test? Take over-the-counter and prescription medicines only as told by your health care provider. If you take inhaler or nebulizer medicines, ask your health care provider which medicines you should take on the day of your testing. Some inhaler medicines may interfere with PFTs if they are taken shortly before the tests. Follow instructions from your health care provider about what you may eat and drink. These may include: Avoiding eating large meals. Avoiding using caffeine before the testing. Not drinkingalcohol for up to 4 hours before the test. Do not use any products that contain nicotine or tobacco for up to 4 hours before your test. These products include cigarettes, chewing tobacco, and vaping devices, such as e-cigarettes.  These can affect your test results. If you need help quitting, ask your health care provider. Wear comfortable clothing that will not get in the way of your breathing. Avoid exercise that takes a lot of effort (strenuous exercise) for at least 30 minutes before the test. What happens during the test?  You will be given: A soft noseclip to wear. This allows all of your breaths to go through your mouth instead of your nose. A germ-free  (sterile) mouthpiece. It will be attached to a spirometer machine that measures your breathing. You will be asked to do breathing exercises. The exercises will be done by breathing in (inhaling) and breathing out (exhaling). You may have to repeat the exercises many times before the testing is complete. You will need to follow instructions exactly as told to get accurate results. Make sure to blow as hard and as fast as you can when you are told to do so. You may be given a medicine called a bronchodilator. This makes the small air passages in your lungs larger so you can breathe easier. The tests will be repeated after the medicine takes effect. You will be watched for any problems, such as feeling faint or dizzy, or having trouble breathing. The procedure may vary among health care providers and hospitals. What can I expect after the test? Your results will be compared with the expected lung function of someone with healthy lungs who is similar to you in several ways. These ways include age, sex, height, weight, and race or ethnicity. This is done to show how your lung function compares with normal lung function (percent predicted). The percent predicted helps your health care provider know if your lung function is normal or not. If you have had PFTs done before, your health care provider will compare your current results with past results. This shows if your lung function is better, worse, or the same as before. It is up to you to get the results of your procedure. Ask your health care provider, or the department that is doing the procedure, when your results will be ready. After you get your results, talk with your health care provider about treatment options, if necessary. This information is not intended to replace advice given to you by your health care provider. Make sure you discuss any questions you have with your health care provider. Document Revised: 01/11/2022 Document Reviewed:  01/11/2022 Elsevier Patient Education  2024 [employer]    Follow-Up: At [facility], you and your health needs are our priority.  As part of our continuing mission to provide you with exceptional heart care, we have created designated Provider Care Teams.  These Care Teams include your primary Cardiologist (physician) and Advanced Practice Providers (APPs -  Physician Assistants and Nurse Practitioners) who all work together to provide you with the care you need, when you need it.   Your next appointment:   16 week(s)  Provider:   Thomasene Ripple, DO    Other Instructions [email or web address] [email or web address]        FDA-cleared, clinical grade mobile EKG monitor: Lourena Simmonds is the most clinically-validated mobile EKG used by the world's leading cardiac care medical professionals With Basic service, know instantly if your heart rhythm is normal or if atrial fibrillation is detected, and email the last single EKG recording to yourself or your doctor Premium service, available for purchase through the Kardia app for $9.99 per month or $99 per year, includes unlimited history and storage of your EKG recordings,  a monthly EKG summary report to share with your doctor, along with the ability to track your blood pressure, activity and weight Includes one K[employer]iaMobile phone clip FREE SHIPPING: Standard delivery 1-3 business days. Orders placed by 11:00am PST will ship that afternoon. Otherwise, will ship next business day. All orders ship via [employer] from [place], [place]    [employer] - sending an EKG Download app and set up profile. Run EKG - by placing 1-2 fingers on the silver plates After EKG is complete - Download PDF  - Skip password (if you apply a password the provider will need it to view the EKG) Click share button (square with upward arrow) in bottom left corner To send: choose MyChart (first time log into MyChart)  Pop up window about  sending ECG Click continue Choose type of message Choose provider Type subject and message Click send (EKG should be attached)  - To send additional EKGs in one message click the paperclip image and bottom of page to attach.

## 2023-04-06 NOTE — Progress Notes (Signed)
Cardiology Office Note:    Date:  04/06/2023   ID:  KEIAIRA SHALOM, DOB 1975/08/28, MRN [id number]  PCP:  Esperanza Richters, PA-C  Cardiologist:  Thomasene Ripple, DO  Electrophysiologist:  None   Referring MD: Clayborne Dana, NP   " I am having chest pain"  History of Present Illness:    Nicole Donaldson is a 47 y.o. female with a hx of childhood asthma, Gestational diabetes was previously seen by Dr. Tomie China. Her last visit was 07/25/2019.  She presents with a sensation of weight and heaviness in the chest, which started around the onset of the COVID-19 pandemic. The sensation is intermittent and does not seem to be triggered by any specific activity or situation. The patient has sought medical attention for this symptom in the past, undergoing an EKG, troponin test, echo, and MRI, all of which were reported as normal.  She also tells me that she has been experiencing some shortness of breath which is situational. When this occurs she even experience conversational shortness of breath.  The patient also reports intermittent leg swelling and has been diagnosed with erythema nodosum, which has caused persistent swelling in the arms.   The patient's family history is significant for hypertension, congestive heart failure, and cancer. The patient's mother is on blood pressure medication and takes pills for fluid retention. The patient's grandmother had multiple bypass surgeries and strokes, and passed away from congestive heart failure.  Past Medical History:  Diagnosis Date   Anemia    Asthma    childhood exercise induced   Enlarged thyroid    during pregnancy   Headache    Migraines   History of blood transfusion    History of hiatal hernia    No pertinent past medical history    Pinched nerve in neck     Past Surgical History:  Procedure Laterality Date   BREAST BIOPSY Right 12/11/2021   COLONOSCOPY     DILATATION & CURETTAGE/HYSTEROSCOPY WITH MYOSURE N/A 03/19/2016   Procedure:  DILATATION & CURETTAGE/HYSTEROSCOPY WITH MYOSURE;  Surgeon: Maxie Better, MD;  Location: [facility];  Service: Gynecology;  Laterality: N/A;   NO PAST SURGERIES     UPPER GI ENDOSCOPY      Current Medications: Current Meds  Medication Sig   ALPRAZolam (XANAX) 1 MG tablet Take 1/2-1 tablet (0.5-1 mg total) by mouth daily as needed for anxiety or insomnia.   colchicine 0.6 MG tablet Take 1 tablet by mouth daily.   cyanocobalamin (VITAMIN B12) 1000 MCG/ML injection Inject 1ml once a month for 5 months   EPINEPHrine (EPIPEN 2-PAK) 0.3 mg/0.3 mL IJ SOAJ injection Inject 0.3 mg into the muscle as needed for anaphylaxis.   fluconazole (DIFLUCAN) 150 MG tablet Take 1 tablet (150 mg total) by mouth as directed. Take one now and repeat in 1 week.   fluticasone (FLONASE) 50 MCG/ACT nasal spray Place 2 sprays into both nostrils daily.   hydrOXYzine (ATARAX/VISTARIL) 25 MG tablet Take 1 tablet (25 mg total) by mouth every 8 (eight) hours as needed for itching.   ibuprofen (ADVIL) 800 MG tablet Take 800 mg by mouth every 8 (eight) hours as needed.   Iron, Ferrous Sulfate, 325 (65 Fe) MG TABS Take one tablet (325 mg) by mouth daily.   levocetirizine (XYZAL) 5 MG tablet Take 1 tablet (5 mg total) by mouth daily as needed for allergies.   loratadine (CLARITIN) 10 MG tablet Take 10 mg by mouth daily as needed for allergies.  meloxicam (MOBIC) 15 MG tablet Take 1 tablet (15 mg total) by mouth daily.   Olopatadine HCl (PATADAY) 0.2 % SOLN Apply 1 drop to eye daily as needed.   promethazine-dextromethorphan (PROMETHAZINE-DM) 6.25-15 MG/5ML syrup Take 5 mLs by mouth 4 (four) times daily as needed for cough.   SYRINGE-NEEDLE, DISP, 3 ML 25G X 5/8" 3 ML MISC Use for monthly b12   tiZANidine (ZANAFLEX) 4 MG tablet Take 1 tablet (4 mg total) by mouth every 6 (six) hours as needed for muscle spasms.   Vitamin D, Ergocalciferol, (DRISDOL) 1.25 MG (50000 UNIT) CAPS capsule Take 1 capsule (50,000 Units total) by mouth  every 7 (seven) days.     Allergies:   Clarithromycin, Dapsone, Diphenhydramine hcl, and Sudafed [pseudoephedrine hcl]   Social History   Socioeconomic History   Marital status: Single    Spouse name: Not on file   Number of children: Not on file   Years of education: Not on file   Highest education level: Not on file  Occupational History   Not on file  Tobacco Use   Smoking status: Never   Smokeless tobacco: Never  Vaping Use   Vaping status: Never Used  Substance and Sexual Activity   Alcohol use: Yes    Comment: occasional   Drug use: No   Sexual activity: Never    Birth control/protection: Pill  Other Topics Concern   Not on file  Social History Narrative   Not on file   Social Determinants of Health   Financial Resource Strain: Not on file  Food Insecurity: Not on file  Transportation Needs: Not on file  Physical Activity: Not on file  Stress: Not on file  Social Connections: Not on file     Family History: The patient's family history includes Breast cancer (age of onset: 34) in her mother; COPD in her maternal grandfather; Cancer in her maternal grandmother and mother; Diabetes in her maternal grandmother; Early death in her father; Heart disease in her maternal grandfather; Hypertension in her maternal grandmother and mother; Thyroid disease in her mother. There is no history of Colon cancer, Colon polyps, Esophageal cancer, Rectal cancer, or Stomach cancer.  ROS:   Review of Systems  Constitution: Negative for decreased appetite, fever and weight gain.  HENT: Negative for congestion, ear discharge, hoarse voice and sore throat.   Eyes: Negative for discharge, redness, vision loss in right eye and visual halos.  Cardiovascular: Reports chest heaviness and shortness of breath. Negative for , leg swelling, orthopnea and palpitations.  Respiratory: Negative for cough, hemoptysis, shortness of breath and snoring.   Endocrine: Negative for heat intolerance and  polyphagia.  Hematologic/Lymphatic: Negative for bleeding problem. Does not bruise/bleed easily.  Skin: Negative for flushing, nail changes, rash and suspicious lesions.  Musculoskeletal: Negative for arthritis, joint pain, muscle cramps, myalgias, neck pain and stiffness.  Gastrointestinal: Negative for abdominal pain, bowel incontinence, diarrhea and excessive appetite.  Genitourinary: Negative for decreased libido, genital sores and incomplete emptying.  Neurological: Negative for brief paralysis, focal weakness, headaches and loss of balance.  Psychiatric/Behavioral: Negative for altered mental status, depression and suicidal ideas.  Allergic/Immunologic: Negative for HIV exposure and persistent infections.    EKGs/Labs/Other Studies Reviewed:    The following studies were reviewed today:   EKG:  The ekg ordered today demonstrates normal sinus rhythm 85 bpm.  Recent Labs: 10/21/2022: ALT 9; BUN 11; Creatinine, Ser 0.76; Hemoglobin 10.5; Platelets 369.0; Potassium 3.5; Pro B Natriuretic peptide (BNP) 31.0; Sodium 141  Recent Lipid Panel    Component Value Date/Time   CHOL 188 07/26/2019 0806   TRIG 63 07/26/2019 0806   HDL 69 07/26/2019 0806   CHOLHDL 2.7 07/26/2019 0806   CHOLHDL 2.5 08/15/2014 0846   VLDL 10 08/15/2014 0846   LDLCALC 107 (H) 07/26/2019 0806    Physical Exam:    VS:  BP 108/74 (BP Location: Left Arm, Patient Position: Sitting, Cuff Size: Normal)   Pulse 85   Ht 5\' 5"  (1.651 m)   Wt 191 lb 9.6 oz (86.9 kg)   SpO2 95%   BMI 31.88 kg/m     Wt Readings from Last 3 Encounters:  04/06/23 191 lb 9.6 oz (86.9 kg)  10/21/22 196 lb (88.9 kg)  07/02/22 196 lb (88.9 kg)     GEN: Well nourished, well developed in no acute distress HEENT: Normal NECK: No JVD; No carotid bruits LYMPHATICS: No lymphadenopathy CARDIAC: S1S2 noted,RRR, no murmurs, rubs, gallops RESPIRATORY:  Clear to auscultation without rales, wheezing or rhonchi  ABDOMEN: Soft, non-tender,  non-distended, +bowel sounds, no guarding. EXTREMITIES: No edema, No cyanosis, no clubbing MUSCULOSKELETAL:  No deformity  SKIN: Warm and dry NEUROLOGIC:  Alert and oriented x 3, non-focal PSYCHIATRIC:  Normal affect, good insight  ASSESSMENT:    1. Encounter to establish care   2. DOE (dyspnea on exertion)   3. SOB (shortness of breath)   4. Pre-diabetes   5. Screening for hyperlipidemia    PLAN:     Chest Heaviness/Shortness of breath Previous workup including EKG, troponin, echo, and cardiac MRI were normal. -Order Pulmonary Function Test to rule out exercise-induced asthma. -Order Stress Echo to assess for exercise-induced pulmonary hypertension. Specifically, looking at pulmonary artery systolic pressures during exercise. Previous stress echo only does not capture this information.  Anxiety Reports situational anxiety and feeling overwhelmed. Xanax has been helpful in the past. -Continue current management per pcp  Screening for hyperlipidemia  Prediabetes History of prediabetes. -Continue lifestyle modifications and regular monitoring of blood glucose levels.  The patient is in agreement with the above plan. The patient left the office in stable condition.  The patient will follow up in   Medication Adjustments/Labs and Tests Ordered: Current medicines are reviewed at length with the patient today.  Concerns regarding medicines are outlined above.  Orders Placed This Encounter  Procedures   Lipid panel   HgB A1c   EKG 12-Lead   ECHOCARDIOGRAM STRESS TEST   Pulmonary function test   No orders of the defined types were placed in this encounter.   Patient Instructions  Medication Instructions:  Your physician recommends that you continue on your current medications as directed. Please refer to the Current Medication list given to you today.  *If you need a refill on your cardiac medications before your next appointment, please call your pharmacy*   Lab  Work: Lipid Panel and Hgb A1C    Testing/Procedures:  Your physician has requested that you have a stress echocardiogram. For further information please visit [email or web address] Please follow instruction sheet as given.      Stress Echocardiogram Information Sheet                                                      Instructions:    1. You may take your morning medications the morning of the  test  2. Light breakfast no caffeine  3. Dress prepared to exercise.  4. DO NOT use ANY caffeine or tobacco products 3 hours before appointment.  5. Please bring all current prescription medications.  Your physician has recommended that you have a pulmonary function test. Pulmonary Function Tests are a group of tests that measure how well air moves in and out of your lungs.   Pulmonary Function Tests Pulmonary function tests (PFTs) are breathing tests that are used to: Measure how well your lungs work. Find out what is causing your lung problems. Find the best treatment for you. You may have PFTs: If you have a condition that affects your lungs, such as asthma or chronic obstructive pulmonary disease (COPD). To watch for changes in your lung function over time if you have a long-term (chronic) lung disease. If you are an IT trainer. PFTs check the effects of being exposed to chemicals over a long period of time. To check lung function: Before having surgery or other procedures. If you smoke. To check if prescribed medicines or treatments are helping your lungs. Tell a health care provider about: Any allergies you have. All medicines you are taking, including inhaler or nebulizer medicines, vitamins, herbs, eye drops, creams, and over-the-counter medicines. Any bleeding problems you have. Any surgeries you have had, especially recent surgery of the eye, abdomen, or chest. These can make PFTs difficult or unsafe. Any medical conditions you have, including chest pain or  heart problems, tuberculosis, or respiratory infections such as pneumonia, a cold, or the flu. Any fear of being in closed spaces (claustrophobia). Some of your tests may be in a closed space. What are the risks? Your health care provider will talk with you about risks. These may include: Feeling light-headed due to fast, deep breathing known as overbreathing or hyperventilation. An asthma attack from deep breathing. What happens before the test? Take over-the-counter and prescription medicines only as told by your health care provider. If you take inhaler or nebulizer medicines, ask your health care provider which medicines you should take on the day of your testing. Some inhaler medicines may interfere with PFTs if they are taken shortly before the tests. Follow instructions from your health care provider about what you may eat and drink. These may include: Avoiding eating large meals. Avoiding using caffeine before the testing. Not drinkingalcohol for up to 4 hours before the test. Do not use any products that contain nicotine or tobacco for up to 4 hours before your test. These products include cigarettes, chewing tobacco, and vaping devices, such as e-cigarettes. These can affect your test results. If you need help quitting, ask your health care provider. Wear comfortable clothing that will not get in the way of your breathing. Avoid exercise that takes a lot of effort (strenuous exercise) for at least 30 minutes before the test. What happens during the test?  You will be given: A soft noseclip to wear. This allows all of your breaths to go through your mouth instead of your nose. A germ-free (sterile) mouthpiece. It will be attached to a spirometer machine that measures your breathing. You will be asked to do breathing exercises. The exercises will be done by breathing in (inhaling) and breathing out (exhaling). You may have to repeat the exercises many times before the testing is  complete. You will need to follow instructions exactly as told to get accurate results. Make sure to blow as hard and as fast as you can when you are  told to do so. You may be given a medicine called a bronchodilator. This makes the small air passages in your lungs larger so you can breathe easier. The tests will be repeated after the medicine takes effect. You will be watched for any problems, such as feeling faint or dizzy, or having trouble breathing. The procedure may vary among health care providers and hospitals. What can I expect after the test? Your results will be compared with the expected lung function of someone with healthy lungs who is similar to you in several ways. These ways include age, sex, height, weight, and race or ethnicity. This is done to show how your lung function compares with normal lung function (percent predicted). The percent predicted helps your health care provider know if your lung function is normal or not. If you have had PFTs done before, your health care provider will compare your current results with past results. This shows if your lung function is better, worse, or the same as before. It is up to you to get the results of your procedure. Ask your health care provider, or the department that is doing the procedure, when your results will be ready. After you get your results, talk with your health care provider about treatment options, if necessary. This information is not intended to replace advice given to you by your health care provider. Make sure you discuss any questions you have with your health care provider. Document Revised: 01/11/2022 Document Reviewed: 01/11/2022 Elsevier Patient Education  2024 Elsevier Inc.    Follow-Up: At [facility], you and your health needs are our priority.  As part of our continuing mission to provide you with exceptional heart care, we have created designated Provider Care Teams.  These Care Teams include your  primary Cardiologist (physician) and Advanced Practice Providers (APPs -  Physician Assistants and Nurse Practitioners) who all work together to provide you with the care you need, when you need it.   Your next appointment:   16 week(s)  Provider:   Thomasene Ripple, DO    Other Instructions [email or web address] [email or web address]        FDA-cleared, clinical grade mobile EKG monitor: Lourena Simmonds is the most clinically-validated mobile EKG used by the world's leading cardiac care medical professionals With Basic service, know instantly if your heart rhythm is normal or if atrial fibrillation is detected, and email the last single EKG recording to yourself or your doctor Premium service, available for purchase through the Kardia app for $9.99 per month or $99 per year, includes unlimited history and storage of your EKG recordings, a monthly EKG summary report to share with your doctor, along with the ability to track your blood pressure, activity and weight Includes one KardiaMobile phone clip FREE SHIPPING: Standard delivery 1-3 business days. Orders placed by 11:00am PST will ship that afternoon. Otherwise, will ship next business day. All orders ship via [employer] from [place], [place]    [employer] - sending an EKG Download app and set up profile. Run EKG - by placing 1-2 fingers on the silver plates After EKG is complete - Download PDF  - Skip password (if you apply a password the provider will need it to view the EKG) Click share button (square with upward arrow) in bottom left corner To send: choose MyChart (first time log into MyChart)  Pop up window about sending ECG Click continue Choose type of message Choose provider Type subject and message Click send (EKG should be attached)  -  To send additional EKGs in one message click the paperclip image and bottom of page to attach.      Adopting a Healthy Lifestyle.  Know what a healthy weight is for you  (roughly BMI <25) and aim to maintain this   Aim for 7+ servings of fruits and vegetables daily   65-80+ fluid ounces of water or unsweet tea for healthy kidneys   Limit to max 1 drink of alcohol per day; avoid smoking/tobacco   Limit animal fats in diet for cholesterol and heart health - choose grass fed whenever available   Avoid highly processed foods, and foods high in saturated/trans fats   Aim for low stress - take time to unwind and care for your mental health   Aim for 150 min of moderate intensity exercise weekly for heart health, and weights twice weekly for bone health   Aim for 7-9 hours of sleep daily   When it comes to diets, agreement about the perfect plan isnt easy to find, even among the experts. Experts at the [facility] of [employer] developed an idea known as the Healthy Eating Plate. Just imagine a plate divided into logical, healthy portions.   The emphasis is on diet quality:   Load up on vegetables and fruits - one-half of your plate: Aim for color and variety, and remember that potatoes dont count.   Go for whole grains - one-quarter of your plate: Whole wheat, barley, wheat berries, quinoa, oats, brown rice, and foods made with them. If you want pasta, go with whole wheat pasta.   Protein power - one-quarter of your plate: Fish, chicken, beans, and nuts are all healthy, versatile protein sources. Limit red meat.   The diet, however, does go beyond the plate, offering a few other suggestions.   Use healthy plant oils, such as olive, canola, soy, corn, sunflower and peanut. Check the labels, and avoid partially hydrogenated oil, which have unhealthy trans fats.   If youre thirsty, drink water. Coffee and tea are good in moderation, but skip sugary drinks and limit milk and dairy products to one or two daily servings.   The type of carbohydrate in the diet is more important than the amount. Some sources of carbohydrates, such as vegetables, fruits,  whole grains, and beans-are healthier than others.   Finally, stay active  Signed, Thomasene Ripple, DO  04/06/2023 8:25 PM    [facility]

## 2023-04-07 DIAGNOSIS — R7303 Prediabetes: Secondary | ICD-10-CM | POA: Diagnosis not present

## 2023-04-07 DIAGNOSIS — Z13220 Encounter for screening for lipoid disorders: Secondary | ICD-10-CM | POA: Diagnosis not present

## 2023-04-08 LAB — HEMOGLOBIN A1C
Est. average glucose Bld gHb Est-mCnc: 140 mg/dL
Hgb A1c MFr Bld: 6.5 % — ABNORMAL HIGH (ref 4.8–5.6)

## 2023-04-08 LAB — LIPID PANEL
Chol/HDL Ratio: 2.6 {ratio} (ref 0.0–4.4)
Cholesterol, Total: 151 mg/dL (ref 100–199)
HDL: 58 mg/dL (ref >39)
LDL Chol Calc (NIH): 81 mg/dL (ref 0–99)
Triglycerides: 57 mg/dL (ref 0–149)
VLDL Cholesterol Cal: 12 mg/dL (ref 5–40)

## 2023-04-15 ENCOUNTER — Other Ambulatory Visit (HOSPITAL_BASED_OUTPATIENT_CLINIC_OR_DEPARTMENT_OTHER): Payer: Self-pay

## 2023-04-26 ENCOUNTER — Telehealth (HOSPITAL_COMMUNITY): Payer: Self-pay | Admitting: *Deleted

## 2023-04-26 NOTE — Telephone Encounter (Signed)
Left detailed instructions on VM for stress echo.

## 2023-04-28 ENCOUNTER — Ambulatory Visit (HOSPITAL_COMMUNITY): Payer: 59 | Attending: Cardiology

## 2023-04-28 ENCOUNTER — Ambulatory Visit (HOSPITAL_COMMUNITY): Payer: 59

## 2023-04-28 DIAGNOSIS — R0609 Other forms of dyspnea: Secondary | ICD-10-CM | POA: Diagnosis not present

## 2023-04-28 MED ORDER — PERFLUTREN LIPID MICROSPHERE
1.0000 mL | INTRAVENOUS | Status: AC | PRN
Start: 2023-04-28 — End: 2023-04-28
  Administered 2023-04-28: 2 mL via INTRAVENOUS
  Administered 2023-04-28: 4 mL via INTRAVENOUS
  Administered 2023-04-28: 2 mL via INTRAVENOUS

## 2023-04-29 ENCOUNTER — Encounter: Payer: Self-pay | Admitting: Medical

## 2023-04-29 ENCOUNTER — Ambulatory Visit (HOSPITAL_COMMUNITY)
Admission: RE | Admit: 2023-04-29 | Discharge: 2023-04-29 | Disposition: A | Discharge status: Home / Self Care | Payer: 59 | Source: Ambulatory Visit | Attending: Cardiology

## 2023-04-29 ENCOUNTER — Other Ambulatory Visit (HOSPITAL_BASED_OUTPATIENT_CLINIC_OR_DEPARTMENT_OTHER): Payer: Self-pay

## 2023-04-29 ENCOUNTER — Ambulatory Visit: Payer: 59 | Admitting: Medical

## 2023-04-29 VITALS — BP 110/70 | HR 87 | Temp 97.7°F | Ht 65.0 in | Wt 192.8 lb

## 2023-04-29 DIAGNOSIS — Z79899 Other long term (current) drug therapy: Secondary | ICD-10-CM | POA: Diagnosis not present

## 2023-04-29 DIAGNOSIS — F419 Anxiety disorder, unspecified: Secondary | ICD-10-CM | POA: Diagnosis not present

## 2023-04-29 DIAGNOSIS — R0602 Shortness of breath: Secondary | ICD-10-CM | POA: Insufficient documentation

## 2023-04-29 DIAGNOSIS — E119 Type 2 diabetes mellitus without complications: Secondary | ICD-10-CM | POA: Diagnosis not present

## 2023-04-29 DIAGNOSIS — Z7984 Long term (current) use of oral hypoglycemic drugs: Secondary | ICD-10-CM | POA: Diagnosis not present

## 2023-04-29 LAB — PULMONARY FUNCTION TEST
DL/VA % pred: 116 %
DL/VA: 5.03 ml/min/mmHg/L
DLCO unc % pred: 93 %
DLCO unc: 20.64 ml/min/mmHg
FEF 25-75 Post: 2.96 L/s
FEF 25-75 Pre: 2.88 L/s
FEF2575-%Change-Post: 2 %
FEF2575-%Pred-Post: 99 %
FEF2575-%Pred-Pre: 97 %
FEV1-%Change-Post: -2 %
FEV1-%Pred-Post: 76 %
FEV1-%Pred-Pre: 78 %
FEV1-Post: 2.3 L
FEV1-Pre: 2.36 L
FEV1FVC-%Change-Post: 4 %
FEV1FVC-%Pred-Pre: 106 %
FEV6-%Change-Post: -6 %
FEV6-%Pred-Post: 70 %
FEV6-%Pred-Pre: 74 %
FEV6-Post: 2.56 L
FEV6-Pre: 2.73 L
FEV6FVC-%Pred-Post: 102 %
FEV6FVC-%Pred-Pre: 102 %
FVC-%Change-Post: -6 %
FVC-%Pred-Post: 68 %
FVC-%Pred-Pre: 73 %
FVC-Post: 2.56 L
FVC-Pre: 2.73 L
Post FEV1/FVC ratio: 90 %
Post FEV6/FVC ratio: 100 %
Pre FEV1/FVC ratio: 86 %
Pre FEV6/FVC Ratio: 100 %
RV % pred: 87 %
RV: 1.53 L
TLC % pred: 84 %
TLC: 4.39 L

## 2023-04-29 MED ORDER — METFORMIN HCL 500 MG PO TABS
500.0000 mg | ORAL_TABLET | Freq: Every day | ORAL | 3 refills | Status: DC
  Filled 2023-04-29: qty 30, 30d supply, fill #0

## 2023-04-29 MED ORDER — ALPRAZOLAM 1 MG PO TABS
0.5000 mg | ORAL_TABLET | Freq: Every day | ORAL | 0 refills | Status: DC | PRN
  Filled 2023-04-29: qty 30, 30d supply, fill #0

## 2023-04-29 MED ORDER — ALBUTEROL SULFATE (2.5 MG/3ML) 0.083% IN NEBU
2.5000 mg | INHALATION_SOLUTION | Freq: Once | RESPIRATORY_TRACT | Status: AC
  Administered 2023-04-29: 2.5 mg via RESPIRATORY_TRACT

## 2023-04-29 NOTE — Progress Notes (Signed)
Subjective:    Patient ID: Nicole Donaldson, female    DOB: Feb 07, 1976, 47 y.o.   MRN: [id number]  HPI  Discussed the use of AI scribe software for clinical note transcription with the patient, who gave verbal consent to proceed.  History of Present Illness   The patient, with a history of anxiety, presents with an increased frequency of anxiety symptoms. She reports having been out of Xanax, which she had been using as needed for anxiety relief. The patient notes that she has been experiencing anxiety more often, necessitating the use of Xanax more frequently. 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 Work also source of stress and parenting.  In addition to anxiety, the patient has been diagnosed as prediabetic, with an A1-C of 6.5. She has not yet started any medication for this condition.  The patient also reports intermittent chest heaviness and shortness of breath. She recently underwent an echocardiogram and stress test with contrast for these symptoms.  In terms of mental health, the patient denies feeling depressed, stating that she feels better and is trying to be more active. However, she acknowledges that she has a lot going on and has been feeling overwhelmed.  Past treatments for anxiety, including sertraline and Buspar, have not been effective for the patient. She reports that Xanax has been the only medication that has helped with her anxiety symptoms.          Pt update me that she has seen cardiologist.  "Chest Heaviness/Shortness of breath Previous workup including EKG, troponin, echo, and cardiac MRI were normal. -Order Pulmonary Function Test to rule out exercise-induced asthma. -Order Stress Echo to assess for exercise-induced pulmonary hypertension. Specifically, looking at pulmonary artery systolic pressures during exercise. Previous stress echo only does not capture this information.    Anxiety Reports situational anxiety and feeling overwhelmed. Xanax has been helpful in the past. -Continue current management per pcp   Screening for hyperlipidemia   Prediabetes History of prediabetes. -Continue lifestyle modifications and regular monitoring of blood glucose levels."  No current cardiac type symptoms.  Review of Systems  Constitutional:  Negative for chills, fatigue and fever.  HENT:  Negative for congestion.   Respiratory:  Negative for cough, chest tightness and wheezing.   Cardiovascular:  Negative for chest pain and palpitations.  Gastrointestinal:  Negative for abdominal pain.  Genitourinary:  Negative for dysuria.  Musculoskeletal:  Negative for back pain.  Neurological:  Negative for dizziness, speech difficulty, weakness and light-headedness.  Hematological:  Negative for adenopathy.  Psychiatric/Behavioral:  Positive for dysphoric mood. Negative for suicidal ideas. The patient is nervous/anxious.    Past Medical History:  Diagnosis Date   Anemia    Asthma    childhood exercise induced   Enlarged thyroid    during pregnancy   Headache    Migraines   History of blood transfusion    History of hiatal hernia    No pertinent past medical history    Pinched nerve in neck      Social History   Socioeconomic History   Marital status: Single    Spouse name: Not on file   Number of children: Not on file   Years of education: Not on file   Highest education level: Not on file  Occupational History   Not on file  Tobacco Use   Smoking status: Never   Smokeless tobacco: Never  Vaping Use   Vaping  status: Never Used  Substance and Sexual Activity   Alcohol use: Yes    Comment: occasional   Drug use: No   Sexual activity: Never    Birth control/protection: Pill  Other Topics Concern   Not on file  Social History Narrative   Not on file   Social Determinants of Health   Financial Resource Strain: Not on file  Food Insecurity: Not on  file  Transportation Needs: Not on file  Physical Activity: Not on file  Stress: Not on file  Social Connections: Not on file  Intimate Partner Violence: Not on file    Past Surgical History:  Procedure Laterality Date   BREAST BIOPSY Right 12/11/2021   COLONOSCOPY     DILATATION & CURETTAGE/HYSTEROSCOPY WITH MYOSURE N/A 03/19/2016   Procedure: DILATATION & CURETTAGE/HYSTEROSCOPY WITH MYOSURE;  Surgeon: Maxie Better, MD;  Location: [facility];  Service: Gynecology;  Laterality: N/A;   NO PAST SURGERIES     UPPER GI ENDOSCOPY      Family History  Problem Relation Age of Onset   Cancer Mother        breast   Hypertension Mother    Thyroid disease Mother    Breast cancer Mother 69   Early death Father    Cancer Maternal Grandmother        bladder   Diabetes Maternal Grandmother    Hypertension Maternal Grandmother    COPD Maternal Grandfather    Heart disease Maternal Grandfather    Colon cancer Neg Hx    Colon polyps Neg Hx    Esophageal cancer Neg Hx    Rectal cancer Neg Hx    Stomach cancer Neg Hx     Allergies  Allergen Reactions   Clarithromycin Swelling    Lip swelling   Dapsone Swelling   Diphenhydramine Hcl Swelling   Sudafed [Pseudoephedrine Hcl] Swelling    Lip swelling    Current Outpatient Medications on File Prior to Visit  Medication Sig Dispense Refill   ALPRAZolam (XANAX) 1 MG tablet Take 1/2-1 tablet (0.5-1 mg total) by mouth daily as needed for anxiety or insomnia. 15 tablet 0   colchicine 0.6 MG tablet Take 1 tablet by mouth daily. 60 tablet 11   cyanocobalamin (VITAMIN B12) 1000 MCG/ML injection Inject 1ml once a month for 5 months 5 mL 0   EPINEPHrine (EPIPEN 2-PAK) 0.3 mg/0.3 mL IJ SOAJ injection Inject 0.3 mg into the muscle as needed for anaphylaxis. 2 each 2   fluconazole (DIFLUCAN) 150 MG tablet Take 1 tablet (150 mg total) by mouth as directed. Take one now and repeat in 1 week. 2 tablet 0   fluticasone (FLONASE) 50 MCG/ACT nasal spray  Place 2 sprays into both nostrils daily. 16 g 0   hydrOXYzine (ATARAX/VISTARIL) 25 MG tablet Take 1 tablet (25 mg total) by mouth every 8 (eight) hours as needed for itching. 30 tablet 0   ibuprofen (ADVIL) 800 MG tablet Take 800 mg by mouth every 8 (eight) hours as needed.     Iron, Ferrous Sulfate, 325 (65 Fe) MG TABS Take one tablet (325 mg) by mouth daily. 100 tablet 3   levocetirizine (XYZAL) 5 MG tablet Take 1 tablet (5 mg total) by mouth daily as needed for allergies. 30 tablet 4   loratadine (CLARITIN) 10 MG tablet Take 10 mg by mouth daily as needed for allergies.     meloxicam (MOBIC) 15 MG tablet Take 1 tablet (15 mg total) by mouth daily. 30 tablet 0  Olopatadine HCl (PATADAY) 0.2 % SOLN Apply 1 drop to eye daily as needed. 2.5 mL 4   promethazine-dextromethorphan (PROMETHAZINE-DM) 6.25-15 MG/5ML syrup Take 5 mLs by mouth 4 (four) times daily as needed for cough. 240 mL 0   tiZANidine (ZANAFLEX) 4 MG tablet Take 1 tablet (4 mg total) by mouth every 6 (six) hours as needed for muscle spasms. 30 tablet 0   Vitamin D, Ergocalciferol, (DRISDOL) 1.25 MG (50000 UNIT) CAPS capsule Take 1 capsule (50,000 Units total) by mouth every 7 (seven) days. 8 capsule 0   No current facility-administered medications on file prior to visit.    BP 110/70   Pulse 87   Temp 97.7 F (36.5 C)   Ht 5\' 5"  (1.651 m)   Wt 192 lb 12.8 oz (87.5 kg)   SpO2 100%   BMI 32.08 kg/m          Objective:   Physical Exam  General Mental Status- Alert. General Appearance- Not in acute distress.   Skin General: Color- Normal Color. Moisture- Normal Moisture.  Neck Carotid Arteries- Normal color. Moisture- Normal Moisture. No carotid bruits. No JVD.  Chest and Lung Exam Auscultation: Breath Sounds:-Normal.  Cardiovascular Auscultation:Rythm- Regular. Murmurs & Other Heart Sounds:Auscultation of the heart reveals- No Murmurs.  Abdomen Inspection:-Inspeection Normal. Palpation/Percussion:Note:No  mass. Palpation and Percussion of the abdomen reveal- Non Tender, Non Distended + BS, no rebound or guarding.   Neurologic Cranial Nerve exam:- CN III-XII intact(No nystagmus), symmetric smile. Strength:- 5/5 equal and symmetric strength both upper and lower extremities.       Assessment & Plan:   Assessment and Plan    Anxiety Increased frequency of anxiety symptoms. Previously on Xanax 0.5-1mg  as needed with good response. SSRIs and Buspar were ineffective in the past. -Prescribe Xanax 1mg , 30 tablets per month, with instructions to take 0.5-1 tablet daily as needed. -uds and contract today  Type 2 Diabetes Newly diagnosed with A1C of 6.5. Discussed lifestyle modifications and the potential benefits of Metformin. -Refer to diabetic education. -Start Metformin 500mg  daily.  Chest Heaviness and Shortness of Breath Undergoing evaluation with cardiology and pulmonology. Recent echocardiogram and pending pulmonary function tests. -Continue to follow up with specialists.  Depression PHQ-9 score of 13, indicating moderate depression, but patient reports feeling better. Previously tried Sertraline without benefit. -Monitor mood and consider alternative antidepressants such as Effexor (venlafaxine) if mood worsens.   Follow up in one month or sooner if needed.        Esperanza Richters, PA-C

## 2023-04-29 NOTE — Patient Instructions (Signed)
Anxiety Increased frequency of anxiety symptoms. Previously on Xanax 0.5-1mg  as needed with good response. SSRIs and Buspar were ineffective in the past. -Prescribe Xanax 1mg , 30 tablets per month, with instructions to take 0.5-1 tablet daily as needed. -uds and contract today  Type 2 Diabetes Newly diagnosed with A1C of 6.5. Discussed lifestyle modifications and the potential benefits of Metformin. -Refer to diabetic education. -Start Metformin 500mg  daily.  Chest Heaviness and Shortness of Breath Undergoing evaluation with cardiology and pulmonology. Recent echocardiogram and pending pulmonary function tests. -Continue to follow up with specialists.  Depression PHQ-9 score of 13, indicating moderate depression, but patient reports feeling better. Previously tried Sertraline without benefit. -Monitor mood and consider alternative antidepressants such as Effexor (venlafaxine) if mood worsens.   Follow up in one month or sooner if needed.

## 2023-05-01 LAB — DRUG MONITORING PANEL 375977 , URINE
Alcohol Metabolites: NEGATIVE ng/mL (ref <500)
Alphahydroxyalprazolam: 104 ng/mL — ABNORMAL HIGH (ref <25)
Alphahydroxymidazolam: NEGATIVE ng/mL (ref <50)
Alphahydroxytriazolam: NEGATIVE ng/mL (ref <50)
Aminoclonazepam: NEGATIVE ng/mL (ref <25)
Amphetamines: NEGATIVE ng/mL (ref <500)
Barbiturates: NEGATIVE ng/mL (ref <300)
Benzodiazepines: POSITIVE ng/mL — AB (ref <100)
Cocaine Metabolite: NEGATIVE ng/mL (ref <150)
Desmethyltramadol: NEGATIVE ng/mL (ref <100)
Hydroxyethylflurazepam: NEGATIVE ng/mL (ref <50)
Lorazepam: NEGATIVE ng/mL (ref <50)
Marijuana Metabolite: NEGATIVE ng/mL (ref <20)
Nordiazepam: NEGATIVE ng/mL (ref <50)
Opiates: NEGATIVE ng/mL (ref <100)
Oxazepam: NEGATIVE ng/mL (ref <50)
Oxycodone: NEGATIVE ng/mL (ref <100)
Temazepam: NEGATIVE ng/mL (ref <50)
Tramadol: NEGATIVE ng/mL (ref <100)

## 2023-05-01 LAB — DM TEMPLATE

## 2023-05-02 LAB — ECHOCARDIOGRAM STRESS TEST
Area-P 1/2: 4.06 cm2
S' Lateral: 2.5 cm

## 2023-05-12 ENCOUNTER — Ambulatory Visit: Payer: 59 | Admitting: Medical

## 2023-05-13 ENCOUNTER — Other Ambulatory Visit (HOSPITAL_BASED_OUTPATIENT_CLINIC_OR_DEPARTMENT_OTHER): Payer: Self-pay

## 2023-05-13 ENCOUNTER — Ambulatory Visit: Payer: 59 | Admitting: Medical

## 2023-05-13 VITALS — BP 114/72 | HR 84 | Resp 18 | Ht 65.0 in | Wt 188.4 lb

## 2023-05-13 DIAGNOSIS — L089 Local infection of the skin and subcutaneous tissue, unspecified: Secondary | ICD-10-CM

## 2023-05-13 DIAGNOSIS — L52 Erythema nodosum: Secondary | ICD-10-CM | POA: Diagnosis not present

## 2023-05-13 MED ORDER — DOXYCYCLINE HYCLATE 100 MG PO TABS
100.0000 mg | ORAL_TABLET | Freq: Two times a day (BID) | ORAL | 0 refills | Status: DC
  Filled 2023-05-13: qty 20, 10d supply, fill #0

## 2023-05-13 MED ORDER — FLUCONAZOLE 150 MG PO TABS
150.0000 mg | ORAL_TABLET | Freq: Every day | ORAL | 0 refills | Status: DC
  Filled 2023-05-13: qty 1, 1d supply, fill #0

## 2023-05-13 MED ORDER — COLCHICINE 0.6 MG PO TABS
ORAL_TABLET | Freq: Every day | ORAL | 5 refills | Status: DC
  Filled 2023-05-13: qty 30, 30d supply, fill #0
  Filled 2023-09-15: qty 30, 30d supply, fill #1
  Filled 2024-03-29: qty 30, 30d supply, fill #2

## 2023-05-13 MED ORDER — EPINEPHRINE 0.3 MG/0.3ML IJ SOAJ
0.3000 mg | INTRAMUSCULAR | 2 refills | Status: AC | PRN
  Filled 2023-05-13: qty 2, 2d supply, fill #0
  Filled 2024-01-24: qty 2, 2d supply, fill #1

## 2023-05-13 NOTE — Patient Instructions (Addendum)
Erythema Nodosum(possible in differential but think more likely skin infection vs cellulits) Flare up with nodule formation and redness since October 4th. Out of colchicine and topical triamcinolone not effective. -Refill colchicine 1mg  daily, to start in 10 days.   Possible Early Cellulitis Left lower extremity with redness and tenderness. No systemic symptoms. -Start doxycycline 100mg  twice daily for 10 days. -If symptoms worsen or change, patient to notify provider. -Follow up appointment next Friday.  Potential for Yeast Infection .-Provide Diflucan 1 tablet as needed for yeast infection.(explained not to use colchicine and diflucan together due to interaction/side effect.   Follow up in 7 days or sooner if needed

## 2023-05-13 NOTE — Progress Notes (Signed)
   Subjective:    Patient ID: Nicole Donaldson, female    DOB: [birth year], 47 y.o.   MRN: [id number]  HPI Discussed the use of AI scribe software for clinical note transcription with the patient, who gave verbal consent to proceed.  History of Present Illness   The patient, with a known history of erythema nodosum, presents with a month-long flare-up of symptoms, initially noticed as redness on the left shin area on October 4th. The patient describes the affected area as tender, with a palpable nodule, and notes that the redness has darkened and spread over time. The patient has been using a topical cream (Triamcinolone) as prescribed by their dermatologist, but reports no improvement with this treatment.  The patient has a history of using colchicine for the management of erythema nodosum, but has recently run out of this medication. They were previously advised to taper the colchicine as their symptoms had improved, and were taking it once daily. The patient denies any associated fevers, chills, or sweats, but reports feeling pressure in the affected area.  The patient has never had a skin infection or cellulitis before, but due to the persistence and progression of the current symptoms, they expressed concern about the possibility of cellulitis. The patient has a long history of managing erythema nodosum, including previous biopsies of nodules, and notes that this is the longest duration of tenderness they have experienced.        Review of Systems See hpi.    Objective:   Physical Exam  General- No acute distress. Pleasant patient. Neck- Full range of motion, no jvd Lungs- Clear, even and unlabored. Heart- regular rate and rhythm. Neurologic- CNII- XII grossly intact.    lt pretibial area SKIN: red area measuring approximately 4 by 2 centimeters. Induartion and mild tender. Some dark hyperpigmented areaa surround from erythema nodosum. No calf swelling.    Assessment & Plan:   Assessment and Plan    Patient Instructions  Erythema Nodosum(possible in differential but think more likely skin infection vs cellulits) Flare up with nodule formation and redness since October 4th. Out of colchicine and topical triamcinolone not effective. -Refill colchicine 1mg  daily, to start in 10 days.   Possible Early Cellulitis Left lower extremity with redness and tenderness. No systemic symptoms. -Start doxycycline 100mg  twice daily for 10 days. -If symptoms worsen or change, patient to notify provider. -Follow up appointment next Friday.  Potential for Yeast Infection .-Provide Diflucan 1 tablet as needed for yeast infection.(explained not to use colchicine and diflucan together due to interaction/side effect.   Follow up in 7 days or sooner if needed        [employer], PA-C

## 2023-05-17 ENCOUNTER — Telehealth: Payer: Self-pay | Admitting: Cardiology

## 2023-05-17 NOTE — Telephone Encounter (Signed)
Patient is calling to get results for ECHO and PFT that was done 10/24. Please advise.

## 2023-05-17 NOTE — Telephone Encounter (Signed)
Patient is scheduled for Friday at 10:00 am.

## 2023-05-17 NOTE — Telephone Encounter (Signed)
Pt is calling to get results for test that she has had done, She would like a call back.

## 2023-05-20 ENCOUNTER — Encounter: Payer: Self-pay | Admitting: Cardiology

## 2023-05-20 ENCOUNTER — Ambulatory Visit: Payer: 59 | Attending: Internal Medicine | Admitting: Cardiology

## 2023-05-20 ENCOUNTER — Ambulatory Visit: Payer: 59 | Attending: Cardiology

## 2023-05-20 ENCOUNTER — Other Ambulatory Visit: Payer: Self-pay

## 2023-05-20 ENCOUNTER — Other Ambulatory Visit (HOSPITAL_BASED_OUTPATIENT_CLINIC_OR_DEPARTMENT_OTHER): Payer: Self-pay

## 2023-05-20 ENCOUNTER — Telehealth: Payer: Self-pay | Admitting: Cardiology

## 2023-05-20 VITALS — Ht 65.0 in | Wt 188.0 lb

## 2023-05-20 DIAGNOSIS — R0789 Other chest pain: Secondary | ICD-10-CM

## 2023-05-20 DIAGNOSIS — R609 Edema, unspecified: Secondary | ICD-10-CM | POA: Diagnosis not present

## 2023-05-20 DIAGNOSIS — R002 Palpitations: Secondary | ICD-10-CM

## 2023-05-20 MED ORDER — FUROSEMIDE 20 MG PO TABS
20.0000 mg | ORAL_TABLET | ORAL | 3 refills | Status: DC
  Filled 2023-05-20: qty 24, 84d supply, fill #0

## 2023-05-20 MED ORDER — POTASSIUM CHLORIDE ER 10 MEQ PO TBCR
10.0000 meq | EXTENDED_RELEASE_TABLET | ORAL | 3 refills | Status: DC
  Filled 2023-05-20: qty 24, 84d supply, fill #0

## 2023-05-20 NOTE — Telephone Encounter (Signed)
Spoke to patient who is calling back to clarify if her stress test need to be repeated according to her report. She also stated while on the video call she forgot to ask about her result for her PFT. She would like to know if her PFT was normal. Please advise.

## 2023-05-20 NOTE — Patient Instructions (Signed)
Medication Instructions:  Your physician has recommended you make the following change in your medication:  START: Lasix 20 mg twice weekly START: Potassium 10 mEq twice weekly *If you need a refill on your cardiac medications before your next appointment, please call your pharmacy*   Lab Work: None  Testing/Procedures: Christena Deem- Long Term Monitor Instructions  Your physician has requested you wear a ZIO patch monitor for 14 days.  This is a single patch monitor. [employer] supplies one patch monitor per enrollment. Additional stickers are not available. Please do not apply patch if you will be having a Nuclear Stress Test,  Echocardiogram, Cardiac CT, MRI, or Chest Xray during the period you would be wearing the  monitor. The patch cannot be worn during these tests. You cannot remove and re-apply the  ZIO XT patch monitor.  Your ZIO patch monitor will be mailed 3 day USPS to your address on file. It may take 3-5 days  to receive your monitor after you have been enrolled.  Once you have received your monitor, please review the enclosed instructions. Your monitor  has already been registered assigning a specific monitor serial # to you.  Billing and Patient Assistance Program Information  We have supplied [employer] with any of your insurance information on file for billing purposes. [employer] offers a sliding scale Patient Assistance Program for patients that do not have  insurance, or whose insurance does not completely cover the cost of the ZIO monitor.  You must apply for the Patient Assistance Program to qualify for this discounted rate.  To apply, please call [employer] at [phone], select option 4, select option 2, ask to apply for  Patient Assistance Program. Meredeth Ide will ask your household income, and how many people  are in your household. They will quote your out-of-pocket cost based on that information.  [employer] will also be able to set up a 35-month, interest-free payment plan if  needed.  Applying the monitor   Shave hair from upper left chest.  Hold abrader disc by orange tab. Rub abrader in 40 strokes over the upper left chest as  indicated in your monitor instructions.  Clean area with 4 enclosed alcohol pads. Let dry.  Apply patch as indicated in monitor instructions. Patch will be placed under collarbone on left  side of chest with arrow pointing upward.  Rub patch adhesive wings for 2 minutes. Remove white label marked "1". Remove the white  label marked "2". Rub patch adhesive wings for 2 additional minutes.  While looking in a mirror, press and release button in center of patch. A small green light will  flash 3-4 times. This will be your only indicator that the monitor has been turned on.  Do not shower for the first 24 hours. You may shower after the first 24 hours.  Press the button if you feel a symptom. You will hear a small click. Record Date, Time and  Symptom in the Patient Logbook.  When you are ready to remove the patch, follow instructions on the last 2 pages of Patient  Logbook. Stick patch monitor onto the last page of Patient Logbook.  Place Patient Logbook in the blue and white box. Use locking tab on box and tape box closed  securely. The blue and white box has prepaid postage on it. Please place it in the mailbox as  soon as possible. Your physician should have your test results approximately 7 days after the  monitor has been mailed back to [facility].  Call [facility] Customer Care at [phone] if you have questions regarding  your ZIO XT patch monitor. Call them immediately if you see an orange light blinking on your  monitor.  If your monitor falls off in less than 4 days, contact our Monitor department at [phone].  If your monitor becomes loose or falls off after 4 days call [employer] at [phone] for  suggestions on securing your monitor    Follow-Up: At [facility], you and your health needs are  our priority.  As part of our continuing mission to provide you with exceptional heart care, we have created designated Provider Care Teams.  These Care Teams include your primary Cardiologist (physician) and Advanced Practice Providers (APPs -  Physician Assistants and Nurse Practitioners) who all work together to provide you with the care you need, when you need it.   Your next appointment:   16 week(s)  Provider:

## 2023-05-20 NOTE — Progress Notes (Unsigned)
Enrolled patient for a 14 day Zio XT  monitor to be mailed to patients home  °

## 2023-05-20 NOTE — Telephone Encounter (Signed)
Patient is calling back after her visit with questions. Please advise

## 2023-05-20 NOTE — Progress Notes (Unsigned)
Virtual Visit via Video Note   Because of [name]'s co-morbid illnesses, she is at least at moderate risk for complications without adequate follow up.  This format is felt to be most appropriate for this patient at this time.  All issues noted in this document were discussed and addressed.  A limited physical exam was performed with this format.  Please refer to the patient's chart for her consent to telehealth for [facility].      Date:  05/23/2023   ID:  Nicole Donaldson, DOB [birth year], MRN [id number]  Patient Location: Home Provider Location: Office/Clinic  PCP:  Esperanza Richters, PA-C  Cardiologist:  Thomasene Ripple, DO  Electrophysiologist:  None   Evaluation Performed:  Follow-Up Visit  Chief Complaint:  " I am having some chest pain"  History of Present Illness:    Nicole Donaldson is a 47 y.o. female with Asthma and Gestational diabetes. She tells me that she has been experiencing chest discomfort. The discomfort, which is not associated with any activity or overexertion, lasted longer than usual and did not subside as it typically would. The patient took Xanax, which seemed to calm her, but the pressure persisted. She also reports feeling her heart beating faster after climbing stairs at home.  In addition to the chest pressure, the patient has been experiencing foot and ankle swelling that persists throughout the day, which is a change from previous episodes that would subside by morning. She is currently on an antibiotic for inflammation in the fatty layer and has not yet started the prescribed metformin.  The patient does not have symptoms concerning for COVID-19 infection (fever, chills, cough, or new shortness of breath).    Past Medical History:  Diagnosis Date   Anemia    Asthma    childhood exercise induced   Enlarged thyroid    during pregnancy   Headache    Migraines   History of blood transfusion    History of hiatal hernia    No pertinent past  medical history    Pinched nerve in neck    Past Surgical History:  Procedure Laterality Date   BREAST BIOPSY Right 12/11/2021   COLONOSCOPY     DILATATION & CURETTAGE/HYSTEROSCOPY WITH MYOSURE N/A 03/19/2016   Procedure: DILATATION & CURETTAGE/HYSTEROSCOPY WITH MYOSURE;  Surgeon: Maxie Better, MD;  Location: [facility];  Service: Gynecology;  Laterality: N/A;   NO PAST SURGERIES     UPPER GI ENDOSCOPY       Current Meds  Medication Sig   ALPRAZolam (XANAX) 1 MG tablet Take 1/2-1 tablet (0.5-1 mg total) by mouth daily as needed for anxiety or insomnia.   colchicine 0.6 MG tablet Take 1 tablet by mouth daily.   doxycycline (VIBRA-TABS) 100 MG tablet Take 1 tablet (100 mg total) by mouth 2 (two) times daily.   EPINEPHrine (EPIPEN 2-PAK) 0.3 mg/0.3 mL IJ SOAJ injection Inject 0.3 mg into the muscle as needed for anaphylaxis.   fluconazole (DIFLUCAN) 150 MG tablet Take 1 tablet (150 mg total) by mouth daily.   fluticasone (FLONASE) 50 MCG/ACT nasal spray Place 2 sprays into both nostrils daily.   furosemide (LASIX) 20 MG tablet Take 1 tablet (20 mg total) by mouth 2 (two) times a week.   hydrOXYzine (ATARAX/VISTARIL) 25 MG tablet Take 1 tablet (25 mg total) by mouth every 8 (eight) hours as needed for itching.   ibuprofen (ADVIL) 800 MG tablet Take 800 mg by mouth every 8 (eight) hours as  needed.   Iron, Ferrous Sulfate, 325 (65 Fe) MG TABS Take one tablet (325 mg) by mouth daily.   loratadine (CLARITIN) 10 MG tablet Take 10 mg by mouth daily as needed for allergies.   meloxicam (MOBIC) 15 MG tablet Take 1 tablet (15 mg total) by mouth daily.   metFORMIN (GLUCOPHAGE) 500 MG tablet Take 1 tablet (500 mg total) by mouth daily with breakfast.   potassium chloride (KLOR-CON) 10 MEQ tablet Take 1 tablet (10 mEq total) by mouth 2 (two) times a week.     Allergies:   Clarithromycin, Dapsone, Diphenhydramine hcl, and Sudafed [pseudoephedrine hcl]   Social History   Tobacco Use   Smoking  status: Never   Smokeless tobacco: Never  Vaping Use   Vaping status: Never Used  Substance Use Topics   Alcohol use: Yes    Comment: occasional   Drug use: No     Family Hx: The patient's family history includes Breast cancer (age of onset: 72) in her mother; COPD in her maternal grandfather; Cancer in her maternal grandmother and mother; Diabetes in her maternal grandmother; Early death in her father; Heart disease in her maternal grandfather; Hypertension in her maternal grandmother and mother; Thyroid disease in her mother. There is no history of Colon cancer, Colon polyps, Esophageal cancer, Rectal cancer, or Stomach cancer.  ROS:   Please see the history of present illness.    Chest discomfort All other systems reviewed and are negative.   Prior CV studies:   The following studies were reviewed today:  Stress echo  Labs/Other Tests and Data Reviewed:    EKG:  No ECG reviewed.  Recent Labs: 10/21/2022: ALT 9; BUN 11; Creatinine, Ser 0.76; Hemoglobin 10.5; Platelets 369.0; Potassium 3.5; Pro B Natriuretic peptide (BNP) 31.0; Sodium 141   Recent Lipid Panel Lab Results  Component Value Date/Time   CHOL 151 04/07/2023 08:48 AM   TRIG 57 04/07/2023 08:48 AM   HDL 58 04/07/2023 08:48 AM   CHOLHDL 2.6 04/07/2023 08:48 AM   CHOLHDL 2.5 08/15/2014 08:46 AM   LDLCALC 81 04/07/2023 08:48 AM    Wt Readings from Last 3 Encounters:  05/20/23 188 lb (85.3 kg)  05/13/23 188 lb 6.4 oz (85.5 kg)  04/29/23 192 lb 12.8 oz (87.5 kg)     Objective:    Vital Signs:  Ht 5\' 5"  (1.651 m)   Wt 188 lb (85.3 kg)   BMI 31.28 kg/m      ASSESSMENT & PLAN:    Atypical Chest Pressure Recurrent episodes of chest pressure, not associated with exertion. No evidence of blockage on recent testing. Xanax provided some relief, suggesting possible anxiety component. -Order heart monitor to rule out arrhythmia as cause of symptoms. -Consider propranolol if arrhythmia detected. Recent stress  echo with no evidence of ischemia.   Lower Extremity Edema Reports of persistent foot and ankle swelling, not resolving overnight as previously. -Start Furosemide 20mg  twice weekly with potassium supplement. -Check response and side effects in 16 weeks.  Diabetes Patient has not started Metformin. -Encourage patient to start Metformin as advised by primary care provider.  Follow-up in 16 weeks or earlier if heart monitor results are available.  COVID-19 Education: The signs and symptoms of COVID-19 were discussed with the patient and how to seek care for testing (follow up with PCP or arrange E-visit).  The importance of social distancing was discussed today.  Time:   Today, I have spent 15 minutes with the patient with telehealth technology discussing  the above problems.     Medication Adjustments/Labs and Tests Ordered: Current medicines are reviewed at length with the patient today.  Concerns regarding medicines are outlined above.   Tests Ordered: Orders Placed This Encounter  Procedures   LONG TERM MONITOR (3-14 DAYS)    Medication Changes: Meds ordered this encounter  Medications   furosemide (LASIX) 20 MG tablet    Sig: Take 1 tablet (20 mg total) by mouth 2 (two) times a week.    Dispense:  27 tablet    Refill:  3   potassium chloride (KLOR-CON) 10 MEQ tablet    Sig: Take 1 tablet (10 mEq total) by mouth 2 (two) times a week.    Dispense:  27 tablet    Refill:  3    Follow Up:  In Person in 6 month(s)  Signed, Thomasene Ripple, DO  05/23/2023 8:09 PM    [facility]

## 2023-05-25 ENCOUNTER — Other Ambulatory Visit (HOSPITAL_BASED_OUTPATIENT_CLINIC_OR_DEPARTMENT_OTHER): Payer: Self-pay

## 2023-05-28 DIAGNOSIS — R002 Palpitations: Secondary | ICD-10-CM

## 2023-06-10 DIAGNOSIS — R002 Palpitations: Secondary | ICD-10-CM | POA: Diagnosis not present

## 2023-06-15 ENCOUNTER — Other Ambulatory Visit (HOSPITAL_BASED_OUTPATIENT_CLINIC_OR_DEPARTMENT_OTHER): Payer: Self-pay

## 2023-06-15 DIAGNOSIS — Z5181 Encounter for therapeutic drug level monitoring: Secondary | ICD-10-CM | POA: Diagnosis not present

## 2023-06-15 DIAGNOSIS — L52 Erythema nodosum: Secondary | ICD-10-CM | POA: Diagnosis not present

## 2023-06-15 MED ORDER — COLCHICINE 0.6 MG PO CAPS
0.6000 mg | ORAL_CAPSULE | Freq: Every day | ORAL | 11 refills | Status: DC
  Filled 2023-06-15: qty 60, 60d supply, fill #0

## 2023-06-16 ENCOUNTER — Other Ambulatory Visit (HOSPITAL_BASED_OUTPATIENT_CLINIC_OR_DEPARTMENT_OTHER): Payer: Self-pay

## 2023-06-17 ENCOUNTER — Other Ambulatory Visit (HOSPITAL_BASED_OUTPATIENT_CLINIC_OR_DEPARTMENT_OTHER): Payer: Self-pay

## 2023-06-22 ENCOUNTER — Other Ambulatory Visit (HOSPITAL_BASED_OUTPATIENT_CLINIC_OR_DEPARTMENT_OTHER): Payer: Self-pay

## 2023-06-30 ENCOUNTER — Other Ambulatory Visit (HOSPITAL_BASED_OUTPATIENT_CLINIC_OR_DEPARTMENT_OTHER): Payer: Self-pay

## 2023-06-30 ENCOUNTER — Telehealth: Payer: 59 | Admitting: Physician Assistant

## 2023-06-30 DIAGNOSIS — T3695XA Adverse effect of unspecified systemic antibiotic, initial encounter: Secondary | ICD-10-CM | POA: Diagnosis not present

## 2023-06-30 DIAGNOSIS — B9689 Other specified bacterial agents as the cause of diseases classified elsewhere: Secondary | ICD-10-CM

## 2023-06-30 DIAGNOSIS — J019 Acute sinusitis, unspecified: Secondary | ICD-10-CM | POA: Diagnosis not present

## 2023-06-30 DIAGNOSIS — B379 Candidiasis, unspecified: Secondary | ICD-10-CM

## 2023-06-30 MED ORDER — AMOXICILLIN-POT CLAVULANATE 875-125 MG PO TABS
1.0000 | ORAL_TABLET | Freq: Two times a day (BID) | ORAL | 0 refills | Status: DC
  Filled 2023-06-30: qty 14, 7d supply, fill #0

## 2023-06-30 MED ORDER — FLUCONAZOLE 150 MG PO TABS
150.0000 mg | ORAL_TABLET | Freq: Once | ORAL | 0 refills | Status: AC
  Filled 2023-06-30: qty 1, 1d supply, fill #0

## 2023-06-30 NOTE — Progress Notes (Signed)
E-Visit for Sinus Problems  We are sorry that you are not feeling well.  Here is how we plan to help!  Based on what you have shared with me it looks like you have sinusitis.  Sinusitis is inflammation and infection in the sinus cavities of the head.  Based on your presentation I believe you most likely have Acute Bacterial Sinusitis.  This is an infection caused by bacteria and is treated with antibiotics. I have prescribed Augmentin 875mg /125mg  one tablet twice daily with food, for 7 days. You may use an oral decongestant such as Mucinex D or if you have glaucoma or high blood pressure use plain Mucinex. Saline nasal spray help and can safely be used as often as needed for congestion.  If you develop worsening sinus pain, fever or notice severe headache and vision changes, or if symptoms are not better after completion of antibiotic, please schedule an appointment with a health care provider.    Diflucan given as prophylaxis as patient tends to get vaginal yeast infections with antibiotic use. Take once antibiotic is completed.  Sinus infections are not as easily transmitted as other respiratory infection, however we still recommend that you avoid close contact with loved ones, especially the very young and elderly.  Remember to wash your hands thoroughly throughout the day as this is the number one way to prevent the spread of infection!  Home Care: Only take medications as instructed by your medical team. Complete the entire course of an antibiotic. Do not take these medications with alcohol. A steam or ultrasonic humidifier can help congestion.  You can place a towel over your head and breathe in the steam from hot water coming from a faucet. Avoid close contacts especially the very young and the elderly. Cover your mouth when you cough or sneeze. Always remember to wash your hands.  Get Help Right Away If: You develop worsening fever or sinus pain. You develop a severe head ache or visual  changes. Your symptoms persist after you have completed your treatment plan.  Make sure you Understand these instructions. Will watch your condition. Will get help right away if you are not doing well or get worse.  Thank you for choosing an e-visit.  Your e-visit answers were reviewed by a board certified advanced clinical practitioner to complete your personal care plan. Depending upon the condition, your plan could have included both over the counter or prescription medications.  Please review your pharmacy choice. Make sure the pharmacy is open so you can pick up prescription now. If there is a problem, you may contact your provider through [employer] and have the prescription routed to another pharmacy.  Your safety is important to [place]. If you have drug allergies check your prescription carefully.   For the next 24 hours you can use MyChart to ask questions about today's visit, request a non-urgent call back, or ask for a work or school excuse. You will get an email in the next two days asking about your experience. I hope that your e-visit has been valuable and will speed your recovery.   I have spent 5 minutes in review of e-visit questionnaire, review and updating patient chart, medical decision making and response to patient.   Margaretann Loveless, PA-C

## 2023-07-04 ENCOUNTER — Encounter: Payer: Self-pay | Admitting: Cardiology

## 2023-07-08 ENCOUNTER — Encounter: Payer: 59 | Attending: Medical | Admitting: Dietician

## 2023-07-08 DIAGNOSIS — E119 Type 2 diabetes mellitus without complications: Secondary | ICD-10-CM | POA: Insufficient documentation

## 2023-07-08 NOTE — Progress Notes (Signed)
 Diabetes Self-Management Education  Visit Type: First/Initial  Appt. Start Time: 11:50 Appt. End Time: 12:58  07/08/2023  Nicole Donaldson, identified by name and date of birth, is a 48 y.o. female with a diagnosis of Diabetes: Type 2.   ASSESSMENT  Pt reports a history of Gestational diabetes controlled with diet and exercise. Patient lives with her 1 year old daughter.  Pt reports she does the the majority of the shopping and cooking. Pt reports sleep is poor stating about 5 hours nightly. 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 Pt states she has made changes including drinking more water and decreasing regular soda and coffee intake.  Pt would like to learn more about what to eat for her blood sugars and the medication metformin . Pt reports the plate is hard because she feels unsatisfied and goes back for seconds. Pt reports eating out 4 times weekly. Pt reports her sleep is poor chronically. All Pt's questions were answered during this encounter.   History includes:   Past Medical History:  Diagnosis Date   Anemia    Asthma    childhood exercise induced   Enlarged thyroid     during pregnancy   Headache    Migraines   History of blood transfusion    History of hiatal hernia    No pertinent past medical history    Pinched nerve in neck     Labs noted:  Current Outpatient Medications:    ALPRAZolam  (XANAX ) 1 MG tablet, Take 1/2-1 tablet (0.5-1 mg total) by mouth daily as needed for anxiety or insomnia., Disp: 30 tablet, Rfl: 0   amoxicillin -clavulanate (AUGMENTIN ) 875-125 MG tablet, Take 1 tablet by mouth 2 (two) times daily., Disp: 14 tablet, Rfl: 0   Colchicine  0.6 MG CAPS, Take 1 capsule (0.6 mg total) by mouth daily., Disp: 60 capsule, Rfl: 11   colchicine  0.6 MG tablet, Take 1 tablet by mouth daily., Disp: 30 tablet, Rfl: 5   EPINEPHrine  (EPIPEN  2-PAK) 0.3 mg/0.3 mL IJ SOAJ injection, Inject 0.3 mg into the muscle as  needed for anaphylaxis., Disp: 2 each, Rfl: 2   fluticasone  (FLONASE ) 50 MCG/ACT nasal spray, Place 2 sprays into both nostrils daily., Disp: 16 g, Rfl: 0   hydrOXYzine  (ATARAX /VISTARIL ) 25 MG tablet, Take 1 tablet (25 mg total) by mouth every 8 (eight) hours as needed for itching., Disp: 30 tablet, Rfl: 0   ibuprofen  (ADVIL ) 800 MG tablet, Take 800 mg by mouth every 8 (eight) hours as needed., Disp: , Rfl:    Iron , Ferrous Sulfate , 325 (65 Fe) MG TABS, Take one tablet (325 mg) by mouth daily., Disp: 100 tablet, Rfl: 3   meloxicam  (MOBIC ) 15 MG tablet, Take 1 tablet (15 mg total) by mouth daily., Disp: 30 tablet, Rfl: 0   promethazine -dextromethorphan (PROMETHAZINE -DM) 6.25-15 MG/5ML syrup, Take 5 mLs by mouth 4 (four) times daily as needed for cough., Disp: 240 mL, Rfl: 0   tiZANidine  (ZANAFLEX ) 4 MG tablet, Take 1 tablet (4 mg total) by mouth every 6 (six) hours as needed for muscle spasms., Disp: 30 tablet, Rfl: 0   cyanocobalamin  (VITAMIN B12) 1000 MCG/ML injection, Inject 1ml once a month for 5 months (Patient not taking: Reported on 07/08/2023), Disp: 5 mL, Rfl: 0   furosemide  (LASIX ) 20 MG tablet, Take 1 tablet (20 mg total) by mouth 2 (two) times a week. (Patient not taking: Reported on 07/08/2023), Disp: 27 tablet, Rfl: 3   levocetirizine (XYZAL ) 5  MG tablet, Take 1 tablet (5 mg total) by mouth daily as needed for allergies. (Patient not taking: Reported on 07/08/2023), Disp: 30 tablet, Rfl: 4   loratadine (CLARITIN) 10 MG tablet, Take 10 mg by mouth daily as needed for allergies. (Patient not taking: Reported on 07/08/2023), Disp: , Rfl:    metFORMIN  (GLUCOPHAGE ) 500 MG tablet, Take 1 tablet (500 mg total) by mouth daily with breakfast. (Patient not taking: Reported on 07/08/2023), Disp: 30 tablet, Rfl: 3   Olopatadine  HCl (PATADAY ) 0.2 % SOLN, Apply 1 drop to eye daily as needed. (Patient not taking: Reported on 05/20/2023), Disp: 2.5 mL, Rfl: 4   potassium chloride  (KLOR-CON ) 10 MEQ tablet, Take 1  tablet (10 mEq total) by mouth 2 (two) times a week. (Patient not taking: Reported on 07/08/2023), Disp: 27 tablet, Rfl: 3   Vitamin D , Ergocalciferol , (DRISDOL ) 1.25 MG (50000 UNIT) CAPS capsule, Take 1 capsule (50,000 Units total) by mouth every 7 (seven) days. (Patient not taking: Reported on 07/08/2023), Disp: 8 capsule, Rfl: 0  Medications include:  Last vitamin D  Lab Results  Component Value Date   VD25OH 16.90 (L) 03/12/2022   Lab Results  Component Value Date   HGBA1C 6.5 (H) 04/07/2023   Lab Results  Component Value Date   CHOL 151 04/07/2023   HDL 58 04/07/2023   LDLCALC 81 04/07/2023   TRIG 57 04/07/2023   CHOLHDL 2.6 04/07/2023   Lab Results  Component Value Date   K 3.5 10/21/2022   There were no vitals taken for this visit. There is no height or weight on file to calculate BMI.  BMI Readings from Last 3 Encounters:  05/20/23 31.28 kg/m  05/13/23 31.35 kg/m  04/29/23 32.08 kg/m     Diabetes Self-Management Education - 07/08/23 1105       Visit Information   Visit Type First/Initial      Initial Visit   Diabetes Type Type 2    Date Diagnosed 2024    Are you currently following a meal plan? No    Are you taking your medications as prescribed? No      Health Coping   How would you rate your overall health? Excellent      Psychosocial Assessment   Patient Belief/Attitude about Diabetes Motivated to manage diabetes    What is the hardest part about your diabetes right now, causing you the most concern, or is the most worrisome to you about your diabetes?   Making healty food and beverage choices;Taking/obtaining medications;Checking blood sugar;Getting support / problem solving    Self-care barriers None    Self-management support Doctor's office    Other persons present Patient    Patient Concerns Nutrition/Meal planning;Medication;Monitoring;Healthy Lifestyle;Problem Solving    Special Needs None    Preferred Learning Style No preference indicated     Learning Readiness Change in progress    How often do you need to have someone help you when you read instructions, pamphlets, or other written materials from your doctor or pharmacy? 1 - Never    What is the last grade level you completed in school? college      Pre-Education Assessment   Patient understands the diabetes disease and treatment process. Needs Instruction    Patient understands incorporating nutritional management into lifestyle. Needs Instruction    Patient undertands incorporating physical activity into lifestyle. Needs Instruction    Patient understands using medications safely. Needs Instruction    Patient understands monitoring blood glucose, interpreting and using results Needs  Instruction    Patient understands prevention, detection, and treatment of acute complications. Needs Instruction    Patient understands prevention, detection, and treatment of chronic complications. Needs Instruction    Patient understands how to develop strategies to address psychosocial issues. Needs Instruction    Patient understands how to develop strategies to promote health/change behavior. Needs Instruction      Complications   Last HgB A1C per patient/outside source 6.5 %    How often do you check your blood sugar? 0 times/day (not testing)    Have you had a dilated eye exam in the past 12 months? Yes    Have you had a dental exam in the past 12 months? No   Pt states a plan to schedule   Are you checking your feet? Yes    How many days per week are you checking your feet? 7      Dietary Intake   Breakfast ~ 9-11am: eggs, bacon or sausage or pack of nabs, coffee with sugar and flavored creamer, water    Lunch ~ 3pm: leftovers or fast food burger, fries, coke or sprite or frozen meals, water    Dinner ~ 7pm: chic fa la meal or noodles, alfredo, chicken or corn or green beans or mashed potatoes, pork chop or seafood resturant with potaot and fried shrimp    Snack (evening) occassionally  veggie straws or cheese itzs    Beverage(s) regular soda, cofee with sugar and flvaored creamer, water      Patient Education   Previous Diabetes Education No    Disease Pathophysiology Definition of diabetes, type 1 and 2, and the diagnosis of diabetes;Explored patient's options for treatment of their diabetes    Healthy Eating Food label reading, portion sizes and measuring food.;Plate Method;Reviewed blood glucose goals for pre and post meals and how to evaluate the patients' food intake on their blood glucose level.    Being Active Role of exercise on diabetes management, blood pressure control and cardiac health.    Medications Reviewed patients medication for diabetes, action, purpose, timing of dose and side effects.    Monitoring Identified appropriate SMBG and/or A1C goals.    Acute complications Discussed and identified patients' prevention, symptoms, and treatment of hyperglycemia.    Chronic complications Dental care;Retinopathy and reason for yearly dilated eye exams    Diabetes Stress and Support Identified and addressed patients feelings and concerns about diabetes;Role of stress on diabetes;Brainstormed with patient on coping mechanisms for social situations, getting support from significant others, dealing with feelings about diabetes    Lifestyle and Health Coping Helped patient develop diabetes management plan for (enter comment)      Individualized Goals (developed by patient)   Nutrition Follow meal plan discussed    Medications Not Applicable    Monitoring  Other (comment)    Problem Solving Addressing barriers to behavior change;Eating Pattern    Reducing Risk do foot checks daily    Health Coping Ask for help with psychological, social, or emotional issues      Post-Education Assessment   Patient understands the diabetes disease and treatment process. Needs Review    Patient understands incorporating nutritional management into lifestyle. Needs Review    Patient  undertands incorporating physical activity into lifestyle. Needs Review    Patient understands using medications safely. Needs Review    Patient understands monitoring blood glucose, interpreting and using results Needs Review    Patient understands prevention, detection, and treatment of acute complications. Needs Review  Patient understands prevention, detection, and treatment of chronic complications. Needs Review    Patient understands how to develop strategies to address psychosocial issues. Needs Review    Patient understands how to develop strategies to promote health/change behavior. Needs Review      Outcomes   Expected Outcomes Demonstrated interest in learning. Expect positive outcomes    Future DMSE 3-4 months    Program Status Not Completed             Individualized Plan for Diabetes Self-Management Training:   Learning Objective:  Patient will have a greater understanding of diabetes self-management. Patient education plan is to attend individual and/or group sessions per assessed needs and concerns.   Plan:   Patient Instructions  1-Plate planner and/or Bite method  1/2 plate of non starchy vegetables, 1/4 protein, 1/4 carbohydrate For every bite of starch aim for 2 bites of non starchy vegetables  Expected Outcomes:  Demonstrated interest in learning. Expect positive outcomes  Education material provided: ADA - How to Thrive: A Guide for Your Journey with Diabetes, My Plate, and Snack sheet and healthy sleeping habits   If problems or questions, patient to contact team via:  Phone  Future DSME appointment: 3-4 months

## 2023-07-08 NOTE — Patient Instructions (Signed)
 1-Plate planner and/or Bite method  1/2 plate of non starchy vegetables, 1/4 protein, 1/4 carbohydrate For every bite of starch aim for 2 bites of non starchy vegetables

## 2023-07-28 ENCOUNTER — Other Ambulatory Visit (HOSPITAL_COMMUNITY): Payer: Self-pay

## 2023-08-12 LAB — HM DIABETES EYE EXAM

## 2023-09-07 ENCOUNTER — Telehealth: Admitting: Physician Assistant

## 2023-09-07 ENCOUNTER — Other Ambulatory Visit (HOSPITAL_BASED_OUTPATIENT_CLINIC_OR_DEPARTMENT_OTHER): Payer: Self-pay

## 2023-09-07 DIAGNOSIS — B3731 Acute candidiasis of vulva and vagina: Secondary | ICD-10-CM | POA: Diagnosis not present

## 2023-09-07 MED ORDER — FLUCONAZOLE 150 MG PO TABS
ORAL_TABLET | ORAL | 0 refills | Status: DC
  Filled 2023-09-07: qty 2, 6d supply, fill #0

## 2023-09-07 NOTE — Progress Notes (Signed)
 I have spent 5 minutes in review of e-visit questionnaire, review and updating patient chart, medical decision making and response to patient.   Piedad Climes, PA-C

## 2023-09-07 NOTE — Progress Notes (Signed)

## 2023-09-09 ENCOUNTER — Ambulatory Visit: Admitting: Family Medicine

## 2023-09-09 ENCOUNTER — Encounter: Payer: Self-pay | Admitting: Family Medicine

## 2023-09-09 ENCOUNTER — Other Ambulatory Visit (HOSPITAL_COMMUNITY)
Admission: RE | Admit: 2023-09-09 | Discharge: 2023-09-09 | Disposition: A | Discharge status: Home / Self Care | Source: Ambulatory Visit | Attending: Medical | Admitting: Medical

## 2023-09-09 ENCOUNTER — Other Ambulatory Visit (HOSPITAL_BASED_OUTPATIENT_CLINIC_OR_DEPARTMENT_OTHER): Payer: Self-pay

## 2023-09-09 VITALS — BP 123/86 | HR 89 | Ht 65.0 in | Wt 187.0 lb

## 2023-09-09 DIAGNOSIS — N898 Other specified noninflammatory disorders of vagina: Secondary | ICD-10-CM | POA: Insufficient documentation

## 2023-09-09 DIAGNOSIS — B3731 Acute candidiasis of vulva and vagina: Secondary | ICD-10-CM | POA: Diagnosis not present

## 2023-09-09 DIAGNOSIS — Z30011 Encounter for initial prescription of contraceptive pills: Secondary | ICD-10-CM

## 2023-09-09 MED ORDER — DROSPIRENONE-ETHINYL ESTRADIOL 3-0.02 MG PO TABS
1.0000 | ORAL_TABLET | Freq: Every day | ORAL | 3 refills | Status: DC
  Filled 2023-09-09: qty 84, 84d supply, fill #0
  Filled 2023-11-21: qty 84, 84d supply, fill #1

## 2023-09-09 NOTE — Progress Notes (Signed)
 Acute Office Visit  Subjective:     Patient ID: Nicole Donaldson, female    DOB: [birth year], 48 y.o.   MRN: [id number]  Chief Complaint  Patient presents with   Vaginal Discharge     Patient is in today for vaginal discharge   Discussed the use of AI scribe software for clinical note transcription with the patient, who gave verbal consent to proceed.  History of Present Illness She presents with vaginal irritation and requests STD screening.  She experiences vaginal irritation and itching, particularly during wiping, and is prone to yeast infections following her menstrual cycle. She took a Diflucan pill earlier this week when she felt symptoms of a yeast infection developing, which has led to an improvement in her symptoms. She is unsure if a second dose is necessary. No unusual spotting or significant change in discharge is noted, and she denies pelvic pain or cramping, although she sometimes experiences cramping after her menstrual cycle.  She has a new partner and wants to undergo STD screening. No unusual spotting since her period, and her discharge is typically 'cloudy to clear.' She recalls a past episode of urethritis and occasionally notices a little white discharge when examining her urethra, but nothing out of the ordinary.  Her menstrual cycle began at the end of last week and was tapering off at the beginning of this week. She is not currently on any form of birth control and has not been for years. She inquires about birth control options, mentioning a past use of Yaz, which she found effective in managing her menstrual cycle.      All review of systems negative except what is listed in the HPI      Objective:    BP 123/86   Pulse 89   Ht 5\' 5"  (1.651 m)   Wt 187 lb (84.8 kg)   SpO2 100%   BMI 31.12 kg/m    Physical Exam Vitals reviewed.  Constitutional:      Appearance: Normal appearance.  Genitourinary:    Comments: Deferred, self swab Neurological:      Mental Status: She is alert and oriented to person, place, and time.  Psychiatric:        Mood and Affect: Mood normal.        Behavior: Behavior normal.        Thought Content: Thought content normal.        Judgment: Judgment normal.     No results found for any visits on 09/09/23.      Assessment & Plan:   Problem List Items Addressed This Visit   None Visit Diagnoses       Vaginal discharge    -  Primary   Relevant Orders   Cervicovaginal ancillary only     Encounter for initial prescription of contraceptive pills       Relevant Medications   drospirenone-ethinyl estradiol (YAZ) 3-0.02 MG tablet      Assessment & Plan Vaginal Irritation and STD screening History of recurrent yeast infections, recent Diflucan use with some improvement. No unusual spotting or pelvic pain. -Perform vaginal swab to test for yeast, BV, gonorrhea, chlamydia, and trichomonas. Declines additional screening at this time.   Birth Control No current use, previous positive experience with Yaz. No contraindications to estrogen-containing contraceptives. -urine pregnancy test. - negative -Yaz sent in      Meds ordered this encounter  Medications   drospirenone-ethinyl estradiol (YAZ) 3-0.02 MG tablet    Sig: Take 1  tablet by mouth daily.    Dispense:  84 tablet    Refill:  3    Supervising Provider:   Danise Edge A [[id number]]    Return if symptoms worsen or fail to improve.  Clayborne Dana, NP

## 2023-09-09 NOTE — Patient Instructions (Signed)
 Starting Your Oral Contraception:  1) First Day Start - Take your first pill during the first 24 hours of your menstrual cycle. No back-up contraceptivemethod is needed when the pill is started the first day of your menses.  2) Sunday Start - Wait until the first Sunday after your menstrual cycle begins to take your first pill. With this optionuse another method of birth control for the first 7 days of the first cycle only.  3) "Quick Start" - Start the pill today. If you have had unprotected sexual intercourse since your last period, perform a pregnancy test prior to starting the pill. If it is negative, start the pill today. Use another method of birth control such as condoms or spermicide the first seven days of the first cycle of use.  Oral Contraception Answers to Common Questions.   1)  Take your birth control pill at the same time every day. This ensures that a constant hormone level is maintained at all times.  2) Should you experience nausea or vomiting, try taking your pill after a meal or at bedtime.  3)  Irregular vaginal bleeding or spotting may occur while you are taking the pills, especially during the first few months of oral contraceptive use. If you experience spotting or break-through bleeding, (bleeding that occurs outside of the placebo week) that occurs after the first 3 cycles, or lasts for more than a few days, see your health care provider.  4) Birth control pills do not protect you from sexually transmitted infections.  INSTRUCTIONS FOR MISSED PILLS: 1 active pill < 24 hours late in any week: Take 1 active pill ASAP* and continue pack as usual.  Missed 1 or more active pills (i.e., >24 hours late): If during week 1: Take 1 active pill ASAP* and continue pack as usual. Back-up contraception for 7 days. Consider Emergency Contraception (EC) if unprotected intercourse occurred within the  5 days prior to missing pill.  If during week 2 or 3 and missed < 3  pills: Take 1 active pill ASAP* and continue active pills as usual, but discard placebo pills and start a new pack  If during week 2 or 3 and ? 3 pills missed: Take 1 active pill ASAP* and continue active pills as usual, but discard placebo pills and start a new pack Back-up contraception for 7 days. Consider EC if repeated or prolonged omission, or if unprotected intercourse occurred during the time the pills were missed and up until seven active pills have been taken Instructions for missed extended or continuous hormonal contraceptives:  Missed pill after 21 consecutive days of extended or continuous use, up to 7 days can be missed.  If > 7 days missed, instructions would be the same as for cyclic users who have missed/delayed combined hormonal contraceptive in the first week of use.  When the extended/continuous regimen is resumed, recommendations for cyclic users for missed/delayed combined hormonal contraceptive during the first 21 consecutive days of use should be followed.  **If you still are not sure what to do about the pills you have missed, use a back-up method anytime you have sex. Keep taking one pill each day until you can reach your doctor or clinic.  MEDICATIONS AND BIRTH CONTROL PILLS: The following medications and supplements may interfere with the effectiveness of CHC:  some antibiotics  anticonvulsants  St. John's Wort  Provigil  It is recommend that if you take the above medications and supplements and are sexually active with a female partner,  you use a back-up method of birth control while using the medication and for 7 consecutive days once the medication is completed.  IF YOU EXPERIENCE ANY OF THE FOLLOWING, PLEASE CALL IMMEDIATELY OR GO TO THE EMERGENCY ROOM:  severe abdominal pain or tenderness in the lower abdomen chest pain, sharp, sudden shortness of breath or coughing up blood headache, severe and sudden, or vomiting, dizziness or faintness eyesight  problems, such as sudden blurred or doubled vision or flashes of light severe pain or swelling in calf or groin

## 2023-09-12 ENCOUNTER — Other Ambulatory Visit (HOSPITAL_BASED_OUTPATIENT_CLINIC_OR_DEPARTMENT_OTHER): Payer: Self-pay

## 2023-09-12 ENCOUNTER — Encounter: Payer: Self-pay | Admitting: Family Medicine

## 2023-09-12 LAB — CERVICOVAGINAL ANCILLARY ONLY
Bacterial Vaginitis (gardnerella): NEGATIVE
Candida Glabrata: NEGATIVE
Candida Vaginitis: POSITIVE — AB
Chlamydia: NEGATIVE
Comment: NEGATIVE
Comment: NEGATIVE
Comment: NEGATIVE
Comment: NEGATIVE
Comment: NEGATIVE
Comment: NORMAL
Neisseria Gonorrhea: NEGATIVE
Trichomonas: NEGATIVE

## 2023-09-12 MED ORDER — FLUCONAZOLE 150 MG PO TABS
150.0000 mg | ORAL_TABLET | Freq: Every day | ORAL | 0 refills | Status: DC
  Filled 2023-09-12: qty 2, 2d supply, fill #0

## 2023-09-12 NOTE — Addendum Note (Signed)
 Addended by: Hyman Hopes B on: 09/12/2023 04:41 PM   Modules accepted: Orders

## 2023-09-15 ENCOUNTER — Other Ambulatory Visit (HOSPITAL_BASED_OUTPATIENT_CLINIC_OR_DEPARTMENT_OTHER): Payer: Self-pay

## 2023-09-21 ENCOUNTER — Telehealth: Admitting: Physician Assistant

## 2023-09-21 ENCOUNTER — Other Ambulatory Visit (HOSPITAL_BASED_OUTPATIENT_CLINIC_OR_DEPARTMENT_OTHER): Payer: Self-pay

## 2023-09-21 DIAGNOSIS — J019 Acute sinusitis, unspecified: Secondary | ICD-10-CM

## 2023-09-21 DIAGNOSIS — B9689 Other specified bacterial agents as the cause of diseases classified elsewhere: Secondary | ICD-10-CM

## 2023-09-21 MED ORDER — AMOXICILLIN-POT CLAVULANATE 875-125 MG PO TABS
1.0000 | ORAL_TABLET | Freq: Two times a day (BID) | ORAL | 0 refills | Status: DC
  Filled 2023-09-21: qty 14, 7d supply, fill #0

## 2023-09-21 MED ORDER — FLUCONAZOLE 150 MG PO TABS
150.0000 mg | ORAL_TABLET | Freq: Every day | ORAL | 0 refills | Status: DC
  Filled 2023-09-21: qty 2, 2d supply, fill #0

## 2023-09-21 NOTE — Progress Notes (Signed)
 E-Visit for Sinus Problems  We are sorry that you are not feeling well.  Here is how we plan to help!  Based on what you have shared with me it looks like you have sinusitis.  Sinusitis is inflammation and infection in the sinus cavities of the head.  Based on your presentation I believe you most likely have Acute Bacterial Sinusitis.  This is an infection caused by bacteria and is treated with antibiotics. I have prescribed Augmentin 875mg /125mg  one tablet twice daily with food, for 7 days. You may use an oral decongestant such as Mucinex D or if you have glaucoma or high blood pressure use plain Mucinex. Saline nasal spray help and can safely be used as often as needed for congestion.  If you develop worsening sinus pain, fever or notice severe headache and vision changes, or if symptoms are not better after completion of antibiotic, please schedule an appointment with a health care provider.    I have also sent in a course of Diflucan in case of yeast from antibiotic use.   Sinus infections are not as easily transmitted as other respiratory infection, however we still recommend that you avoid close contact with loved ones, especially the very young and elderly.  Remember to wash your hands thoroughly throughout the day as this is the number one way to prevent the spread of infection!  Home Care: Only take medications as instructed by your medical team. Complete the entire course of an antibiotic. Do not take these medications with alcohol. A steam or ultrasonic humidifier can help congestion.  You can place a towel over your head and breathe in the steam from hot water coming from a faucet. Avoid close contacts especially the very young and the elderly. Cover your mouth when you cough or sneeze. Always remember to wash your hands.  Get Help Right Away If: You develop worsening fever or sinus pain. You develop a severe head ache or visual changes. Your symptoms persist after you have  completed your treatment plan.  Make sure you Understand these instructions. Will watch your condition. Will get help right away if you are not doing well or get worse.  Thank you for choosing an e-visit.  Your e-visit answers were reviewed by a board certified advanced clinical practitioner to complete your personal care plan. Depending upon the condition, your plan could have included both over the counter or prescription medications.  Please review your pharmacy choice. Make sure the pharmacy is open so you can pick up prescription now. If there is a problem, you may contact your provider through [employer] and have the prescription routed to another pharmacy.  Your safety is important to [place]. If you have drug allergies check your prescription carefully.   For the next 24 hours you can use MyChart to ask questions about today's visit, request a non-urgent call back, or ask for a work or school excuse. You will get an email in the next two days asking about your experience. I hope that your e-visit has been valuable and will speed your recovery.

## 2023-09-21 NOTE — Progress Notes (Signed)
 I have spent 5 minutes in review of e-visit questionnaire, review and updating patient chart, medical decision making and response to patient.   Piedad Climes, PA-C

## 2023-09-26 ENCOUNTER — Other Ambulatory Visit (HOSPITAL_BASED_OUTPATIENT_CLINIC_OR_DEPARTMENT_OTHER): Payer: Self-pay

## 2023-11-21 ENCOUNTER — Other Ambulatory Visit: Payer: Self-pay | Admitting: Medical

## 2023-11-21 ENCOUNTER — Other Ambulatory Visit (HOSPITAL_BASED_OUTPATIENT_CLINIC_OR_DEPARTMENT_OTHER): Payer: Self-pay

## 2023-11-21 ENCOUNTER — Other Ambulatory Visit: Payer: Self-pay

## 2023-11-21 MED ORDER — ALPRAZOLAM 1 MG PO TABS
0.5000 mg | ORAL_TABLET | Freq: Every day | ORAL | 0 refills | Status: DC | PRN
  Filled 2023-11-21: qty 30, 30d supply, fill #0

## 2023-11-21 NOTE — Telephone Encounter (Signed)
 Requesting: xanax  Contract:06/23/2023 UDS:04/29/2023 Last Visit:05/13/23 Next Visit:n/a Last Refill:04/29/23  Please Advise

## 2023-12-28 ENCOUNTER — Other Ambulatory Visit (HOSPITAL_BASED_OUTPATIENT_CLINIC_OR_DEPARTMENT_OTHER): Payer: Self-pay | Admitting: Medical

## 2023-12-28 DIAGNOSIS — Z1231 Encounter for screening mammogram for malignant neoplasm of breast: Secondary | ICD-10-CM

## 2024-01-19 ENCOUNTER — Encounter (HOSPITAL_BASED_OUTPATIENT_CLINIC_OR_DEPARTMENT_OTHER): Payer: Self-pay

## 2024-01-19 ENCOUNTER — Ambulatory Visit (HOSPITAL_BASED_OUTPATIENT_CLINIC_OR_DEPARTMENT_OTHER)
Admission: RE | Admit: 2024-01-19 | Discharge: 2024-01-19 | Disposition: A | Discharge status: Home / Self Care | Source: Ambulatory Visit | Attending: Medical | Admitting: Medical

## 2024-01-19 DIAGNOSIS — Z1231 Encounter for screening mammogram for malignant neoplasm of breast: Secondary | ICD-10-CM | POA: Insufficient documentation

## 2024-01-24 ENCOUNTER — Ambulatory Visit: Payer: Self-pay | Admitting: Medical

## 2024-01-24 ENCOUNTER — Other Ambulatory Visit: Payer: Self-pay | Admitting: Family

## 2024-01-30 ENCOUNTER — Encounter: Payer: Self-pay | Admitting: Medical

## 2024-01-30 NOTE — Telephone Encounter (Signed)
 Hello will she be able to receive a lab visit?

## 2024-01-31 NOTE — Telephone Encounter (Signed)
Pt called apt made.

## 2024-02-01 ENCOUNTER — Telehealth: Payer: Self-pay | Admitting: Medical

## 2024-02-01 DIAGNOSIS — E119 Type 2 diabetes mellitus without complications: Secondary | ICD-10-CM

## 2024-02-01 NOTE — Telephone Encounter (Signed)
 Good morning this pt is on the schedule Friday does she need lab orders?

## 2024-02-01 NOTE — Telephone Encounter (Signed)
 Lab order put in for A1c

## 2024-02-02 ENCOUNTER — Telehealth: Payer: Self-pay

## 2024-02-02 ENCOUNTER — Other Ambulatory Visit: Payer: Self-pay | Admitting: Family

## 2024-02-02 ENCOUNTER — Other Ambulatory Visit (HOSPITAL_BASED_OUTPATIENT_CLINIC_OR_DEPARTMENT_OTHER): Payer: Self-pay

## 2024-02-02 ENCOUNTER — Other Ambulatory Visit: Payer: Self-pay | Admitting: Medical

## 2024-02-02 NOTE — Telephone Encounter (Signed)
 Appt canceled and refill request sent to pcp in previous refill request encounter

## 2024-02-02 NOTE — Telephone Encounter (Signed)
 Copied from CRM [id number]. Topic: General - Other >> Feb 02, 2024  8:33 AM Nicole Donaldson wrote: Reason for CRM: patient called stating she does not know if she need to see the doctor to get a refill for xanax .  Patient stated she can get her blood work done at her obgyn office because she has an appointment with them.  CB [phone]

## 2024-02-02 NOTE — Telephone Encounter (Unsigned)
 Copied from CRM [id number]. Topic: Clinical - Medication Refill >> Feb 02, 2024  8:38 AM Rosina BIRCH wrote: Medication: ALPRAZolam  (XANAX ) 1 MG tablet  Has the patient contacted their pharmacy? Yes (Agent: If no, request that the patient contact the pharmacy for the refill. If patient does not wish to contact the pharmacy document the reason why and proceed with request.) (Agent: If yes, when and what did the pharmacy advise?)  This is the patient's preferred pharmacy:  [facility] [place] [facility] [address] [place] [address] Phone: [phone] Fax: [phone]  Is this the correct pharmacy for this prescription? Yes If no, delete pharmacy and type the correct one.   Has the prescription been filled recently? Yes  Is the patient out of the medication? Yes  Has the patient been seen for an appointment in the last year OR does the patient have an upcoming appointment? Yes  Can we respond through MyChart? Yes  Agent: Please be advised that Rx refills may take up to 3 business days. We ask that you follow-up with your pharmacy.

## 2024-02-02 NOTE — Telephone Encounter (Signed)
 Requesting: xanax  Contract:06/24/2023 UDS:04/29/2023 Last Visit:09/09/23 Next Visit:n/a Last Refill:11/21/23  Please Advise

## 2024-02-03 ENCOUNTER — Other Ambulatory Visit

## 2024-02-03 ENCOUNTER — Other Ambulatory Visit: Payer: Self-pay

## 2024-02-03 ENCOUNTER — Other Ambulatory Visit (HOSPITAL_BASED_OUTPATIENT_CLINIC_OR_DEPARTMENT_OTHER): Payer: Self-pay

## 2024-02-03 MED ORDER — ALPRAZOLAM 1 MG PO TABS
0.5000 mg | ORAL_TABLET | Freq: Every day | ORAL | 0 refills | Status: AC | PRN
  Filled 2024-02-03: qty 30, 30d supply, fill #0

## 2024-02-03 NOTE — Telephone Encounter (Signed)
Rx refill sent to pt pharmacy 

## 2024-02-06 ENCOUNTER — Other Ambulatory Visit: Payer: Self-pay

## 2024-02-07 ENCOUNTER — Other Ambulatory Visit (HOSPITAL_BASED_OUTPATIENT_CLINIC_OR_DEPARTMENT_OTHER): Payer: Self-pay

## 2024-02-07 MED ORDER — AMOXICILLIN 500 MG PO TABS
500.0000 mg | ORAL_TABLET | Freq: Three times a day (TID) | ORAL | 0 refills | Status: DC
  Filled 2024-02-07: qty 21, 7d supply, fill #0

## 2024-02-07 MED ORDER — FLUCONAZOLE 50 MG PO TABS
50.0000 mg | ORAL_TABLET | Freq: Every day | ORAL | 0 refills | Status: DC
  Filled 2024-02-07: qty 7, 7d supply, fill #0

## 2024-02-08 ENCOUNTER — Other Ambulatory Visit (HOSPITAL_BASED_OUTPATIENT_CLINIC_OR_DEPARTMENT_OTHER): Payer: Self-pay

## 2024-02-08 DIAGNOSIS — E7800 Pure hypercholesterolemia, unspecified: Secondary | ICD-10-CM | POA: Diagnosis not present

## 2024-02-08 MED ORDER — VITAMIN D (ERGOCALCIFEROL) 1.25 MG (50000 UNIT) PO CAPS
50000.0000 [IU] | ORAL_CAPSULE | ORAL | 11 refills | Status: AC
  Filled 2024-02-08: qty 4, 28d supply, fill #0
  Filled 2024-04-17: qty 4, 28d supply, fill #1
  Filled 2024-04-23 – 2024-08-01 (×2): qty 4, 28d supply, fill #2

## 2024-02-15 ENCOUNTER — Other Ambulatory Visit (HOSPITAL_BASED_OUTPATIENT_CLINIC_OR_DEPARTMENT_OTHER): Payer: Self-pay

## 2024-02-15 MED ORDER — OMRON 3 SERIES BP MONITOR DEVI
0 refills | Status: AC
  Filled 2024-02-15: qty 1, 30d supply, fill #0

## 2024-02-16 ENCOUNTER — Other Ambulatory Visit (HOSPITAL_BASED_OUTPATIENT_CLINIC_OR_DEPARTMENT_OTHER): Payer: Self-pay

## 2024-02-17 ENCOUNTER — Other Ambulatory Visit (HOSPITAL_COMMUNITY): Payer: Self-pay

## 2024-02-17 ENCOUNTER — Other Ambulatory Visit (HOSPITAL_BASED_OUTPATIENT_CLINIC_OR_DEPARTMENT_OTHER): Payer: Self-pay

## 2024-02-17 MED ORDER — TRAMADOL HCL 50 MG PO TABS
50.0000 mg | ORAL_TABLET | Freq: Four times a day (QID) | ORAL | 0 refills | Status: AC | PRN
  Filled 2024-02-17 (×2): qty 6, 2d supply, fill #0

## 2024-02-17 MED ORDER — AMOXICILLIN-POT CLAVULANATE 875-125 MG PO TABS
1.0000 | ORAL_TABLET | Freq: Two times a day (BID) | ORAL | 0 refills | Status: DC
  Filled 2024-02-17: qty 10, 5d supply, fill #0

## 2024-02-17 MED ORDER — FLUCONAZOLE 50 MG PO TABS
50.0000 mg | ORAL_TABLET | Freq: Every day | ORAL | 0 refills | Status: DC
  Filled 2024-02-17: qty 3, 3d supply, fill #0

## 2024-02-20 DIAGNOSIS — Z113 Encounter for screening for infections with a predominantly sexual mode of transmission: Secondary | ICD-10-CM | POA: Diagnosis not present

## 2024-02-20 DIAGNOSIS — R7303 Prediabetes: Secondary | ICD-10-CM | POA: Diagnosis not present

## 2024-02-20 DIAGNOSIS — Z01419 Encounter for gynecological examination (general) (routine) without abnormal findings: Secondary | ICD-10-CM | POA: Diagnosis not present

## 2024-03-21 ENCOUNTER — Other Ambulatory Visit (HOSPITAL_BASED_OUTPATIENT_CLINIC_OR_DEPARTMENT_OTHER): Payer: Self-pay

## 2024-03-21 MED ORDER — IBUPROFEN 800 MG PO TABS
800.0000 mg | ORAL_TABLET | Freq: Four times a day (QID) | ORAL | 4 refills | Status: DC
  Filled 2024-03-21: qty 30, 8d supply, fill #0

## 2024-03-27 ENCOUNTER — Other Ambulatory Visit (HOSPITAL_BASED_OUTPATIENT_CLINIC_OR_DEPARTMENT_OTHER): Payer: Self-pay

## 2024-03-27 DIAGNOSIS — R87612 Low grade squamous intraepithelial lesion on cytologic smear of cervix (LGSIL): Secondary | ICD-10-CM | POA: Diagnosis not present

## 2024-03-27 DIAGNOSIS — N870 Mild cervical dysplasia: Secondary | ICD-10-CM | POA: Diagnosis not present

## 2024-03-27 DIAGNOSIS — R87810 Cervical high risk human papillomavirus (HPV) DNA test positive: Secondary | ICD-10-CM | POA: Diagnosis not present

## 2024-03-29 ENCOUNTER — Other Ambulatory Visit (HOSPITAL_BASED_OUTPATIENT_CLINIC_OR_DEPARTMENT_OTHER): Payer: Self-pay

## 2024-04-04 ENCOUNTER — Other Ambulatory Visit (HOSPITAL_BASED_OUTPATIENT_CLINIC_OR_DEPARTMENT_OTHER): Payer: Self-pay

## 2024-04-04 MED ORDER — COLCHICINE 0.6 MG PO TABS
0.6000 mg | ORAL_TABLET | Freq: Every day | ORAL | 5 refills | Status: AC
  Filled 2024-04-04: qty 30, 30d supply, fill #0

## 2024-04-04 NOTE — Addendum Note (Signed)
 Addended by: DORINA DALLAS HERO on: 04/04/2024 09:11 PM   Modules accepted: Orders

## 2024-04-05 ENCOUNTER — Other Ambulatory Visit (HOSPITAL_BASED_OUTPATIENT_CLINIC_OR_DEPARTMENT_OTHER): Payer: Self-pay

## 2024-04-17 ENCOUNTER — Other Ambulatory Visit (HOSPITAL_BASED_OUTPATIENT_CLINIC_OR_DEPARTMENT_OTHER): Payer: Self-pay

## 2024-04-17 ENCOUNTER — Telehealth: Admitting: Physician Assistant

## 2024-04-17 ENCOUNTER — Other Ambulatory Visit: Payer: Self-pay

## 2024-04-17 DIAGNOSIS — M545 Low back pain, unspecified: Secondary | ICD-10-CM

## 2024-04-17 MED ORDER — PREDNISONE 10 MG PO TABS
ORAL_TABLET | ORAL | 0 refills | Status: DC
  Filled 2024-04-17: qty 21, 6d supply, fill #0

## 2024-04-17 NOTE — Progress Notes (Signed)
 We are sorry that you are not feeling well.  Here is how we plan to help!  Based on what you have shared with me it looks like you mostly have acute back pain.  Acute back pain is defined as musculoskeletal pain that can resolve in 1-3 weeks with conservative treatment.  I have prescribed a prednisone  dose pack to take as directed. Ok to use OTC Tylenol  as well, while on the steroid.  Back pain is very common.  The pain often gets better over time.  The cause of back pain is usually not dangerous.  Most people can learn to manage their back pain on their own.  Home Care Stay active.  Start with short walks on flat ground if you can.  Try to walk farther each day. Do not sit, drive or stand in one place for more than 30 minutes.  Do not stay in bed. Do not avoid exercise or work.  Activity can help your back heal faster. Be careful when you bend or lift an object.  Bend at your knees, keep the object close to you, and do not twist. Sleep on a firm mattress.  Lie on your side, and bend your knees.  If you lie on your back, put a pillow under your knees. Only take medicines as told by your doctor. Put ice on the injured area. Put ice in a plastic bag Place a towel between your skin and the bag Leave the ice on for 15-20 minutes, 3-4 times a day for the first 2-3 days. 210 After that, you can switch between ice and heat packs. Ask your doctor about back exercises or massage. Avoid feeling anxious or stressed.  Find good ways to deal with stress, such as exercise.  Get Help Right Way If: Your pain does not go away with rest or medicine. Your pain does not go away in 1 week. You have new problems. You do not feel well. The pain spreads into your legs. You cannot control when you poop (bowel movement) or pee (urinate) You feel sick to your stomach (nauseous) or throw up (vomit) You have belly (abdominal) pain. You feel like you may pass out (faint). If you develop a fever.  Make Sure  you: Understand these instructions. Will watch your condition Will get help right away if you are not doing well or get worse.  Your e-visit answers were reviewed by a board certified advanced clinical practitioner to complete your personal care plan.  Depending on the condition, your plan could have included both over the counter or prescription medications.  If there is a problem please reply  once you have received a response from your provider.  Your safety is important to us .  If you have drug allergies check your prescription carefully.    You can use MyChart to ask questions about today's visit, request a non-urgent call back, or ask for a work or school excuse for 24 hours related to this e-Visit. If it has been greater than 24 hours you will need to follow up with your provider, or enter a new e-Visit to address those concerns.  You will get an e-mail in the next two days asking about your experience.  I hope that your e-visit has been valuable and will speed your recovery. Thank you for using e-visits.   I have spent 5 minutes in review of e-visit questionnaire, review and updating patient chart, medical decision making and response to patient.   Elsie Velma Lunger,  PA-C

## 2024-04-23 ENCOUNTER — Ambulatory Visit (INDEPENDENT_AMBULATORY_CARE_PROVIDER_SITE_OTHER)

## 2024-04-23 ENCOUNTER — Other Ambulatory Visit (HOSPITAL_BASED_OUTPATIENT_CLINIC_OR_DEPARTMENT_OTHER): Payer: Self-pay

## 2024-04-23 VITALS — BP 112/78 | Ht 65.0 in | Wt 187.0 lb

## 2024-04-23 DIAGNOSIS — M7989 Other specified soft tissue disorders: Secondary | ICD-10-CM

## 2024-04-23 MED ORDER — DICLOFENAC SODIUM 75 MG PO TBEC
75.0000 mg | DELAYED_RELEASE_TABLET | Freq: Two times a day (BID) | ORAL | 1 refills | Status: DC
  Filled 2024-04-23: qty 28, 14d supply, fill #0
  Filled 2024-06-26: qty 28, 14d supply, fill #1

## 2024-04-23 NOTE — Progress Notes (Signed)
   Subjective:    Patient ID: Nicole Donaldson, female    DOB: 48 y.o., 1976/03/14   MRN: [id number]  Chief Complaint: L side back/buttock pain  History of Present Illness  Nicole Donaldson is a 48 year old female with past medical history significant for erythema nodosum, angioedema presenting for left-sided atraumatic low back/buttock pain. She reports this started approximately 4 weeks ago is more of a dull ache that had intermittent episodes of catching where she would experience some more sharp character of pain.  Was treated with a steroid Dosepak which has improved it somewhat so that it is more of a discomfort than a true pain but certain movements will commonly still bring it out for her. 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 No similar prior injury No radiation of pain down leg    Objective:   Vitals:   04/23/24 1551  BP: 112/78    Left Hip (compared to normal) -Inspection: No swelling or skin changes. No leg length discrepancy. No gait abnormalities with walking. -Palpation: TTP - level ASIS, - level AIIS, - adductor, - greater trochanter, - SI joint, - over piriformis, -gluteal musculature, + in the body of the iliocostalis muscle at a point superior and lateral to the SI joint -AROM/PROM: Flexion 110 deg, abduction 35 deg, adduction 15 deg, extension 10 deg, ER 40 deg, IR 25 deg, normal hamstring flexibility -Strength: 5/5 flexion, 5/5 abduction, 5/5 adduction, 5/5 extension.  Pain not reproduced with  active resistance of gluteus medius. -Special tests: -  FADIR, -  log roll, +Thomas, - Ober, - Noble, - FABER, - piriformis testing, - SLR.  Patient's pain is reproduced with resisted truncal sidebending leftward or during many position changes during the course of my exam today.     Assessment & Plan:   Assessment & Plan  Nicole Donaldson is a very pleasant 48 year old with a history of erythema nodosum, angioedema  presenting with what appears to be a strain of her iliocostalis muscle.  I do not feel that imaging is needed at this time.  We discussed treatment options for this including physical therapy (with an emphasis on dry needling and perhaps McKenzie protocol), topical anti-inflammatories, oral anti-inflammatories, muscle relaxants and ultimately decided on PT + oral Voltaren + Robaxin as needed.  I will have her follow-up with myself on an as needed basis.  Can consider trigger point injections if persists.

## 2024-04-26 ENCOUNTER — Other Ambulatory Visit (HOSPITAL_BASED_OUTPATIENT_CLINIC_OR_DEPARTMENT_OTHER): Payer: Self-pay

## 2024-05-02 ENCOUNTER — Other Ambulatory Visit (HOSPITAL_BASED_OUTPATIENT_CLINIC_OR_DEPARTMENT_OTHER): Payer: Self-pay

## 2024-05-02 MED ORDER — FLUZONE 0.5 ML IM SUSY
0.5000 mL | PREFILLED_SYRINGE | Freq: Once | INTRAMUSCULAR | 0 refills | Status: AC
  Filled 2024-05-02: qty 0.5, 1d supply, fill #0

## 2024-06-05 ENCOUNTER — Telehealth

## 2024-06-05 DIAGNOSIS — H60399 Other infective otitis externa, unspecified ear: Secondary | ICD-10-CM

## 2024-06-06 ENCOUNTER — Other Ambulatory Visit: Payer: Self-pay

## 2024-06-06 ENCOUNTER — Other Ambulatory Visit (HOSPITAL_BASED_OUTPATIENT_CLINIC_OR_DEPARTMENT_OTHER): Payer: Self-pay

## 2024-06-06 MED ORDER — AMOXICILLIN 875 MG PO TABS
875.0000 mg | ORAL_TABLET | Freq: Two times a day (BID) | ORAL | 0 refills | Status: AC
  Filled 2024-06-06: qty 20, 10d supply, fill #0

## 2024-06-06 MED ORDER — CIPROFLOXACIN-DEXAMETHASONE 0.3-0.1 % OT SUSP
OTIC | 0 refills | Status: AC
  Filled 2024-06-06: qty 7.5, 7d supply, fill #0

## 2024-06-06 MED ORDER — FLUCONAZOLE 150 MG PO TABS
150.0000 mg | ORAL_TABLET | Freq: Once | ORAL | 0 refills | Status: AC
  Filled 2024-06-06: qty 2, 2d supply, fill #0

## 2024-06-06 NOTE — Progress Notes (Signed)
 E Visit for Otitis Externa  We are sorry that you are not feeling well. Here is how we plan to help!  I have prescribed: Ciprofloxin 0.2% and hydrocortisone 1% otic suspension 3 drops in affected ears twice daily for 7 days  I have prescribed Amoxicillin  875 mg one tablet twice daily for 10 days  In certain cases external ear infections may progress to a more serious bacterial infection of the middle or inner ear.  If you have a fever 102 and up and significantly worsening symptoms, this could indicate a more serious infection moving to the middle/inner and needs face to face evaluation in an office by a provider.  Your symptoms should improve over the next 3 days and should resolve in about 7 days.  HOME CARE:  Wash your hands frequently. Do not place the tip of the bottle on your ear or touch it with your fingers. You can take Acetominophen 650 mg every 4-6 hours as needed for pain.  If pain is severe or moderate, you can apply a heating pad (set on low) or hot water bottle (wrapped in a towel) to outer ear for 20 minutes.  This will also increase drainage. Avoid ear plugs Do not use Q-tips After showers, help the water run out by tilting your head to one side.  GET HELP RIGHT AWAY IF:  Fever is over 102.2 degrees. You develop progressive ear pain or hearing loss. Ear symptoms persist longer than 3 days after treatment.  MAKE SURE YOU:  Understand these instructions. Will watch your condition. Will get help right away if you are not doing well or get worse.  TO PREVENT SWIMMER'S EAR: Use a bathing cap or custom fitted swim molds to keep your ears dry. Towel off after swimming to dry your ears. Tilt your head or pull your earlobes to allow the water to escape your ear canal. If there is still water in your ears, consider using a hairdryer on the lowest setting.  Thank you for choosing an e-visit. Your e-visit answers were reviewed by a board certified advanced clinical  practitioner to complete your personal care plan. Depending upon the condition, your plan could have included both over the counter or prescription medications. Please review your pharmacy choice. Be sure that the pharmacy you have chosen is open so that you can pick up your prescription now.  If there is a problem you may message your provider in MyChart to have the prescription routed to another pharmacy. Your safety is important to us . If you have drug allergies check your prescription carefully.  For the next 24 hours, you can use MyChart to ask questions about today's visit, request a non-urgent call back, or ask for a work or school excuse from your e-visit provider. You will get an email in the next two days asking about your experience. I hope that your e-visit has been valuable and will speed your recovery.   I have spent 5 minutes in review of e-visit questionnaire, review and updating patient chart, medical decision making and response to patient.   Elsie Velma Lunger, PA-C

## 2024-06-06 NOTE — Addendum Note (Signed)
 Addended by: GLADIS ELSIE BROCKS on: 06/06/2024 02:23 PM   Modules accepted: Orders

## 2024-06-06 NOTE — Addendum Note (Signed)
 Addended by: GLADIS ELSIE BROCKS on: 06/06/2024 02:27 PM   Modules accepted: Orders

## 2024-06-26 ENCOUNTER — Other Ambulatory Visit (HOSPITAL_BASED_OUTPATIENT_CLINIC_OR_DEPARTMENT_OTHER): Payer: Self-pay

## 2024-08-01 ENCOUNTER — Other Ambulatory Visit (HOSPITAL_BASED_OUTPATIENT_CLINIC_OR_DEPARTMENT_OTHER): Payer: Self-pay

## 2024-08-03 ENCOUNTER — Ambulatory Visit (INDEPENDENT_AMBULATORY_CARE_PROVIDER_SITE_OTHER)

## 2024-08-03 ENCOUNTER — Other Ambulatory Visit (HOSPITAL_BASED_OUTPATIENT_CLINIC_OR_DEPARTMENT_OTHER): Payer: Self-pay

## 2024-08-03 ENCOUNTER — Ambulatory Visit

## 2024-08-03 VITALS — BP 100/80 | Ht 65.5 in | Wt 187.0 lb

## 2024-08-03 DIAGNOSIS — M545 Low back pain, unspecified: Secondary | ICD-10-CM | POA: Diagnosis not present

## 2024-08-03 DIAGNOSIS — M533 Sacrococcygeal disorders, not elsewhere classified: Secondary | ICD-10-CM | POA: Diagnosis not present

## 2024-08-03 DIAGNOSIS — M419 Scoliosis, unspecified: Secondary | ICD-10-CM

## 2024-08-03 DIAGNOSIS — M7989 Other specified soft tissue disorders: Secondary | ICD-10-CM | POA: Diagnosis not present

## 2024-08-03 MED ORDER — METHOCARBAMOL 750 MG PO TABS
750.0000 mg | ORAL_TABLET | Freq: Three times a day (TID) | ORAL | 1 refills | Status: AC
  Filled 2024-08-03: qty 42, 14d supply, fill #0

## 2024-08-03 MED ORDER — DICLOFENAC SODIUM 75 MG PO TBEC
75.0000 mg | DELAYED_RELEASE_TABLET | Freq: Two times a day (BID) | ORAL | 1 refills | Status: AC
  Filled 2024-08-03: qty 42, 21d supply, fill #0

## 2024-08-03 NOTE — Progress Notes (Signed)
 "  Subjective:    Patient ID: Nicole Donaldson, female    DOB: 49 y.o., 1975-12-17   MRN: [id number]  Chief Complaint: Left iliocostalis muscle strain (67-month follow-up)  History of Present Illness Patient previously evaluated by myself on 04/23/2024 with an iliocostalis muscle strain with point tenderness to palpation the body of the that muscle at a point superior and lateral to the SI joint.  Recommended physical therapy, provided home exercises, recommended dry needling +/- McKenzie protocol, oral Voltaren  and Robaxin .  Trigger point injection was considered but deferred.  Lumbar and Sacroiliac Pain: - Constant left lower back pain localized over the left sacroiliac joint - No radiation below the SI joint; occasional extension into the thigh or upward - Catchy sensation when walking and marked back fatigue with overexertion - Pain is mild in the morning, worsens throughout the day with activity, and can become debilitating - Limits work duties and household tasks - No recent trauma - No bowel or bladder dysfunction - Avoids squatting, bending, and heavy activities due to pain  Spinal Deformity and Progression: - Degenerative lumbar scoliosis confirmed on prior imaging - Suspects progression of curvature, rotation, and SI/pelvic changes based on informal AP x-ray - No recent formal imaging prior to today - Older abdominal x-rays showed less pronounced curvature  Therapeutic Interventions and Response: - Completed a course of diclofenac  with partial benefit - Acetaminophen  and short course of naproxen were ineffective - No recent use of ibuprofen  - Never tried muscle relaxants - Intermittently performs home exercises; has not participated in formal physical therapy - Uses heat and cold for symptomatic relief - Pain remains functionally limiting despite these measures  Renal Concerns: - Concerned about possible nephrolithiasis due to pain location - No urinary symptoms - Remote  history of kidney and blood infections - No history of kidney stones   Objective:   Vitals:   08/03/24 0912  BP: 100/80   Left hip: TTP in the body of the iliocostalis muscle at a point superior and lateral to the SI joint. Also tender directly over the left SI joint. Pain is reproduced with resisted truncal sidebending leftward or during many position changes during the course of my exam today. Positive Gaenslen's.  Positive Fortin finger.  Positive sacral thrust.     Assessment & Plan:   Assessment & Plan Lumbar paraspinal myofascial pain with degenerative lumbar scoliosis and left sacroiliac joint dysfunction Chronic low back pain is due to degenerative lumbar scoliosis, paraspinal myofascial irritation, and left sacroiliac joint dysfunction. Examination shows positive sacroiliac joint provocative maneuvers and paraspinal tenderness. Imaging indicates scoliosis progression and possible sacroiliac joint abnormality. Pain significantly affects daily function and work. Nephrolithiasis is unlikely without urinary symptoms and given known musculoskeletal findings. The goal is to further assess bony and joint pathology and optimize conservative management, escalating to interventional options if imaging warrants. Ordered lumbar spine AP and lateral radiographs with sacroiliac joint views to evaluate scoliosis progression and sacroiliac joint pathology. Referred to physical therapy for alignment and biomechanical optimization. Prescribed methocarbamol  for muscle relaxation and offered diclofenac  refill for analgesia. Discussed potential sacroiliac joint injection if imaging shows significant arthritis or abnormality and symptoms persist. Advised continued use of heat and cold therapy. Follow-up after imaging to determine further management, including possible interventional procedures. Deferred further nephrolithiasis workup unless pain persists despite musculoskeletal management or new urinary  symptoms develop.   31 minutes were spent in face-to-face time discussing with the patient the nature of her symptoms reviewing radiographs  she has set her low back taken on her phone, discussing lik both potentially and diagnoses, reviewing treatment options, and documenting the aforementioned on same date of service. "

## 2024-08-06 ENCOUNTER — Other Ambulatory Visit (HOSPITAL_BASED_OUTPATIENT_CLINIC_OR_DEPARTMENT_OTHER): Payer: Self-pay

## 2024-08-06 ENCOUNTER — Telehealth: Admitting: Emergency Medicine

## 2024-08-06 DIAGNOSIS — B9789 Other viral agents as the cause of diseases classified elsewhere: Secondary | ICD-10-CM

## 2024-08-06 MED ORDER — AZELASTINE HCL 0.1 % NA SOLN
2.0000 | Freq: Two times a day (BID) | NASAL | 0 refills | Status: AC
  Filled 2024-08-06: qty 30, 50d supply, fill #0

## 2024-08-06 NOTE — Progress Notes (Signed)
 We are sorry that you are not feeling well.  Here is how we plan to help!  Based on what you have shared with me it looks like you have sinusitis.  Sinusitis is inflammation and infection in the sinus cavities of the head.  Based on your presentation I believe you most likely have Acute Viral Sinusitis.This is an infection most likely caused by a virus. There is not specific treatment for viral sinusitis other than to help you with the symptoms until the infection runs its course.  Antibiotics are not recommended by the [employer] of [place] unless you have severe symptoms (including high fever) or you have symptoms for more than 10 days. If you still have symptoms after 10 days, antibiotics should be considered.    You may use an oral decongestant such as Mucinex D or if you have glaucoma or high blood pressure use plain Mucinex.   Saline nasal spray help and can safely be used as often as needed for congestion. Try using saline irrigation, such as with a neti pot, several times a day while you are sick. Many neti pots come with salt packets premeasured to use to make saline. If you use your own salt, make sure it is kosher salt or sea salt (don't use table salt as it has iodine in it and you don't need that in your nose). Use distilled water to make saline. If you mix your own saline using your own salt, the recipe is 1/4 teaspoon salt in 1 cup warm water. Using saline irrigation can help prevent and treat sinus infections.     I have prescribed: Azelastine  nasal spray 2 sprays in each nostril twice a day  Some authorities believe that zinc sprays or the use of Echinacea may shorten the course of your symptoms.  Sinus infections are not as easily transmitted as other respiratory infection, however we still recommend that you avoid close contact with loved ones, especially the very young and elderly.  Remember to wash your hands thoroughly throughout the day as this is the number one way  to prevent the spread of infection!  Home Care: Only take medications as instructed by your medical team. Do not take these medications with alcohol. A steam or ultrasonic humidifier can help congestion.  You can place a towel over your head and breathe in the steam from hot water coming from a faucet. Avoid close contacts especially the very young and the elderly. Cover your mouth when you cough or sneeze. Always remember to wash your hands.  Get Help Right Away If: You develop worsening fever or sinus pain. You develop a severe head ache or visual changes. Your symptoms persist after you have completed your treatment plan.  Make sure you Understand these instructions. Will watch your condition. Will get help right away if you are not doing well or get worse.  Your e-visit answers were reviewed by a board certified advanced clinical practitioner to complete your personal care plan.  Depending on the condition, your plan could have included both over the counter or prescription medications.  If there is a problem please reply  once you have received a response from your provider.  Your safety is important to us .  If you have drug allergies check your prescription carefully.    You can use MyChart to ask questions about today's visit, request a non-urgent call back, or ask for a work or school excuse for 24 hours related to this e-Visit. If it  has been greater than 24 hours you will need to follow up with your provider, or enter a new e-Visit to address those concerns.  You will get an e-mail in the next two days asking about your experience.  I hope that your e-visit has been valuable and will speed your recovery. Thank you for using e-visits.  I have spent 5 minutes in review of e-visit questionnaire, review and updating patient chart, medical decision making and response to patient.   Jon Belt, PhD, FNP-BC

## 2024-08-07 ENCOUNTER — Ambulatory Visit: Payer: Self-pay
